# Patient Record
Sex: Female | Born: 1945
Health system: Southern US, Community
[De-identification: ages and names within clinical notes are randomized; demographics above are authoritative.]

## PROBLEM LIST (undated history)

## (undated) DIAGNOSIS — Z8719 Personal history of other diseases of the digestive system: Secondary | ICD-10-CM

## (undated) DIAGNOSIS — K219 Gastro-esophageal reflux disease without esophagitis: Secondary | ICD-10-CM

## (undated) DIAGNOSIS — E785 Hyperlipidemia, unspecified: Secondary | ICD-10-CM

## (undated) DIAGNOSIS — I219 Acute myocardial infarction, unspecified: Secondary | ICD-10-CM

## (undated) DIAGNOSIS — M1990 Unspecified osteoarthritis, unspecified site: Secondary | ICD-10-CM

## (undated) DIAGNOSIS — K921 Melena: Secondary | ICD-10-CM

## (undated) DIAGNOSIS — I10 Essential (primary) hypertension: Secondary | ICD-10-CM

## (undated) DIAGNOSIS — J189 Pneumonia, unspecified organism: Secondary | ICD-10-CM

## (undated) DIAGNOSIS — T7840XA Allergy, unspecified, initial encounter: Secondary | ICD-10-CM

## (undated) DIAGNOSIS — K635 Polyp of colon: Secondary | ICD-10-CM

## (undated) DIAGNOSIS — J449 Chronic obstructive pulmonary disease, unspecified: Secondary | ICD-10-CM

## (undated) DIAGNOSIS — Z9889 Other specified postprocedural states: Secondary | ICD-10-CM

## (undated) DIAGNOSIS — J309 Allergic rhinitis, unspecified: Secondary | ICD-10-CM

## (undated) DIAGNOSIS — I509 Heart failure, unspecified: Secondary | ICD-10-CM

## (undated) HISTORY — DX: Gastro-esophageal reflux disease without esophagitis: K21.9

## (undated) HISTORY — DX: Acute myocardial infarction, unspecified: I21.9

## (undated) HISTORY — DX: Allergy, unspecified, initial encounter: T78.40XA

## (undated) HISTORY — DX: Hyperlipidemia, unspecified: E78.5

## (undated) HISTORY — DX: Allergic rhinitis, unspecified: J30.9

## (undated) HISTORY — DX: Personal history of other diseases of the digestive system: Z87.19

## (undated) HISTORY — DX: Essential (primary) hypertension: I10

## (undated) HISTORY — DX: Polyp of colon: K63.5

## (undated) HISTORY — DX: Chronic obstructive pulmonary disease, unspecified: J44.9

## (undated) HISTORY — DX: Pneumonia, unspecified organism: J18.9

## (undated) HISTORY — PX: POLYPECTOMY: SHX149

## (undated) HISTORY — DX: Other specified postprocedural states: Z98.890

## (undated) HISTORY — DX: Unspecified osteoarthritis, unspecified site: M19.90

## (undated) HISTORY — DX: Melena: K92.1

## (undated) HISTORY — DX: Heart failure, unspecified: I50.9

## (undated) HISTORY — PX: COLONOSCOPY: SHX174

---

## 1995-07-24 DIAGNOSIS — I509 Heart failure, unspecified: Secondary | ICD-10-CM

## 1995-07-24 HISTORY — DX: Heart failure, unspecified: I50.9

## 1997-11-11 ENCOUNTER — Encounter: Admission: RE | Admit: 1997-11-11 | Discharge: 1997-11-11 | Payer: Self-pay | Admitting: Family Medicine

## 1997-12-07 ENCOUNTER — Encounter: Admission: RE | Admit: 1997-12-07 | Discharge: 1997-12-07 | Payer: Self-pay | Admitting: Family Medicine

## 1998-04-28 ENCOUNTER — Encounter: Admission: RE | Admit: 1998-04-28 | Discharge: 1998-04-28 | Payer: Self-pay | Admitting: Family Medicine

## 1998-08-29 ENCOUNTER — Encounter: Admission: RE | Admit: 1998-08-29 | Discharge: 1998-08-29 | Payer: Self-pay | Admitting: Family Medicine

## 1998-09-05 ENCOUNTER — Encounter: Admission: RE | Admit: 1998-09-05 | Discharge: 1998-09-05 | Payer: Self-pay | Admitting: Sports Medicine

## 1998-10-19 ENCOUNTER — Encounter: Admission: RE | Admit: 1998-10-19 | Discharge: 1998-10-19 | Payer: Self-pay | Admitting: Family Medicine

## 1998-10-26 ENCOUNTER — Encounter: Admission: RE | Admit: 1998-10-26 | Discharge: 1998-10-26 | Payer: Self-pay | Admitting: Family Medicine

## 1999-03-28 ENCOUNTER — Encounter: Admission: RE | Admit: 1999-03-28 | Discharge: 1999-03-28 | Payer: Self-pay | Admitting: Family Medicine

## 1999-04-27 ENCOUNTER — Encounter: Admission: RE | Admit: 1999-04-27 | Discharge: 1999-04-27 | Payer: Self-pay | Admitting: Family Medicine

## 1999-06-08 ENCOUNTER — Encounter: Admission: RE | Admit: 1999-06-08 | Discharge: 1999-06-08 | Payer: Self-pay | Admitting: Sports Medicine

## 1999-06-08 ENCOUNTER — Encounter: Payer: Self-pay | Admitting: Sports Medicine

## 1999-06-19 ENCOUNTER — Encounter: Admission: RE | Admit: 1999-06-19 | Discharge: 1999-06-19 | Payer: Self-pay | Admitting: Family Medicine

## 1999-07-04 ENCOUNTER — Encounter: Admission: RE | Admit: 1999-07-04 | Discharge: 1999-07-04 | Payer: Self-pay | Admitting: Sports Medicine

## 1999-08-22 ENCOUNTER — Encounter: Admission: RE | Admit: 1999-08-22 | Discharge: 1999-08-22 | Payer: Self-pay | Admitting: Sports Medicine

## 1999-09-26 ENCOUNTER — Encounter: Admission: RE | Admit: 1999-09-26 | Discharge: 1999-09-26 | Payer: Self-pay | Admitting: Sports Medicine

## 1999-10-07 ENCOUNTER — Encounter: Payer: Self-pay | Admitting: Emergency Medicine

## 1999-10-08 ENCOUNTER — Encounter: Payer: Self-pay | Admitting: *Deleted

## 1999-10-08 ENCOUNTER — Inpatient Hospital Stay (HOSPITAL_COMMUNITY): Admission: EM | Admit: 1999-10-08 | Discharge: 1999-10-13 | Payer: Self-pay | Admitting: Emergency Medicine

## 1999-10-09 ENCOUNTER — Encounter: Payer: Self-pay | Admitting: Surgery

## 1999-10-10 ENCOUNTER — Encounter: Payer: Self-pay | Admitting: Surgery

## 1999-10-11 ENCOUNTER — Encounter: Payer: Self-pay | Admitting: Surgery

## 1999-10-25 ENCOUNTER — Encounter: Admission: RE | Admit: 1999-10-25 | Discharge: 1999-10-25 | Payer: Self-pay | Admitting: Family Medicine

## 2000-02-27 ENCOUNTER — Encounter: Admission: RE | Admit: 2000-02-27 | Discharge: 2000-02-27 | Payer: Self-pay | Admitting: Family Medicine

## 2000-03-21 ENCOUNTER — Encounter: Admission: RE | Admit: 2000-03-21 | Discharge: 2000-03-21 | Payer: Self-pay | Admitting: Family Medicine

## 2000-04-24 ENCOUNTER — Encounter: Admission: RE | Admit: 2000-04-24 | Discharge: 2000-04-24 | Payer: Self-pay | Admitting: Family Medicine

## 2000-06-07 ENCOUNTER — Encounter: Admission: RE | Admit: 2000-06-07 | Discharge: 2000-06-07 | Payer: Self-pay | Admitting: Family Medicine

## 2000-06-11 ENCOUNTER — Encounter: Payer: Self-pay | Admitting: *Deleted

## 2000-06-11 ENCOUNTER — Encounter: Admission: RE | Admit: 2000-06-11 | Discharge: 2000-06-11 | Payer: Self-pay | Admitting: *Deleted

## 2000-07-05 ENCOUNTER — Encounter: Admission: RE | Admit: 2000-07-05 | Discharge: 2000-07-05 | Payer: Self-pay | Admitting: Family Medicine

## 2000-07-23 HISTORY — PX: CORONARY ANGIOPLASTY WITH STENT PLACEMENT: SHX49

## 2000-08-02 ENCOUNTER — Encounter: Admission: RE | Admit: 2000-08-02 | Discharge: 2000-08-02 | Payer: Self-pay | Admitting: Family Medicine

## 2000-09-11 ENCOUNTER — Encounter: Admission: RE | Admit: 2000-09-11 | Discharge: 2000-09-11 | Payer: Self-pay | Admitting: Family Medicine

## 2000-09-16 ENCOUNTER — Encounter: Admission: RE | Admit: 2000-09-16 | Discharge: 2000-09-16 | Payer: Self-pay | Admitting: Family Medicine

## 2000-10-16 ENCOUNTER — Encounter: Admission: RE | Admit: 2000-10-16 | Discharge: 2000-10-16 | Payer: Self-pay | Admitting: Family Medicine

## 2000-11-27 ENCOUNTER — Encounter: Admission: RE | Admit: 2000-11-27 | Discharge: 2000-11-27 | Payer: Self-pay | Admitting: Family Medicine

## 2001-01-01 ENCOUNTER — Encounter: Admission: RE | Admit: 2001-01-01 | Discharge: 2001-01-01 | Payer: Self-pay | Admitting: Family Medicine

## 2001-02-07 ENCOUNTER — Encounter: Admission: RE | Admit: 2001-02-07 | Discharge: 2001-02-07 | Payer: Self-pay | Admitting: Family Medicine

## 2001-02-27 ENCOUNTER — Encounter: Admission: RE | Admit: 2001-02-27 | Discharge: 2001-02-27 | Payer: Self-pay | Admitting: Family Medicine

## 2001-04-09 ENCOUNTER — Encounter: Payer: Self-pay | Admitting: Cardiology

## 2001-04-09 ENCOUNTER — Inpatient Hospital Stay (HOSPITAL_COMMUNITY): Admission: EM | Admit: 2001-04-09 | Discharge: 2001-04-11 | Payer: Self-pay | Admitting: Emergency Medicine

## 2001-04-22 ENCOUNTER — Encounter (HOSPITAL_COMMUNITY): Admission: RE | Admit: 2001-04-22 | Discharge: 2001-07-21 | Payer: Self-pay | Admitting: Cardiology

## 2001-06-06 ENCOUNTER — Ambulatory Visit (HOSPITAL_COMMUNITY): Admission: RE | Admit: 2001-06-06 | Discharge: 2001-06-06 | Payer: Self-pay | Admitting: Gastroenterology

## 2001-07-03 ENCOUNTER — Encounter: Payer: Self-pay | Admitting: Sports Medicine

## 2001-07-03 ENCOUNTER — Encounter: Admission: RE | Admit: 2001-07-03 | Discharge: 2001-07-03 | Payer: Self-pay | Admitting: Sports Medicine

## 2001-07-23 ENCOUNTER — Encounter (HOSPITAL_COMMUNITY): Admission: RE | Admit: 2001-07-23 | Discharge: 2001-10-21 | Payer: Self-pay | Admitting: Cardiology

## 2001-07-31 ENCOUNTER — Encounter: Admission: RE | Admit: 2001-07-31 | Discharge: 2001-07-31 | Payer: Self-pay | Admitting: Family Medicine

## 2001-11-11 ENCOUNTER — Ambulatory Visit (HOSPITAL_COMMUNITY): Admission: RE | Admit: 2001-11-11 | Discharge: 2001-11-11 | Payer: Self-pay | Admitting: Cardiology

## 2001-11-11 ENCOUNTER — Encounter: Payer: Self-pay | Admitting: Cardiology

## 2001-12-12 ENCOUNTER — Encounter: Admission: RE | Admit: 2001-12-12 | Discharge: 2001-12-12 | Payer: Self-pay | Admitting: Family Medicine

## 2001-12-15 ENCOUNTER — Encounter: Admission: RE | Admit: 2001-12-15 | Discharge: 2001-12-15 | Payer: Self-pay | Admitting: Sports Medicine

## 2002-01-20 ENCOUNTER — Encounter: Admission: RE | Admit: 2002-01-20 | Discharge: 2002-01-20 | Payer: Self-pay | Admitting: Family Medicine

## 2002-01-26 ENCOUNTER — Encounter: Admission: RE | Admit: 2002-01-26 | Discharge: 2002-01-26 | Payer: Self-pay | Admitting: Sports Medicine

## 2002-01-26 ENCOUNTER — Encounter: Payer: Self-pay | Admitting: Sports Medicine

## 2002-02-16 ENCOUNTER — Encounter: Admission: RE | Admit: 2002-02-16 | Discharge: 2002-02-16 | Payer: Self-pay | Admitting: Family Medicine

## 2002-03-17 ENCOUNTER — Encounter: Admission: RE | Admit: 2002-03-17 | Discharge: 2002-03-17 | Payer: Self-pay | Admitting: Sports Medicine

## 2002-05-15 ENCOUNTER — Encounter: Admission: RE | Admit: 2002-05-15 | Discharge: 2002-05-15 | Payer: Self-pay | Admitting: Family Medicine

## 2002-05-18 ENCOUNTER — Encounter: Admission: RE | Admit: 2002-05-18 | Discharge: 2002-05-18 | Payer: Self-pay | Admitting: Family Medicine

## 2002-08-05 ENCOUNTER — Encounter: Admission: RE | Admit: 2002-08-05 | Discharge: 2002-08-05 | Payer: Self-pay | Admitting: Family Medicine

## 2002-08-14 ENCOUNTER — Encounter: Admission: RE | Admit: 2002-08-14 | Discharge: 2002-08-14 | Payer: Self-pay | Admitting: Sports Medicine

## 2002-08-14 ENCOUNTER — Encounter: Payer: Self-pay | Admitting: Sports Medicine

## 2002-10-26 ENCOUNTER — Encounter: Admission: RE | Admit: 2002-10-26 | Discharge: 2002-10-26 | Payer: Self-pay | Admitting: Family Medicine

## 2003-05-21 ENCOUNTER — Encounter: Admission: RE | Admit: 2003-05-21 | Discharge: 2003-05-21 | Payer: Self-pay | Admitting: Family Medicine

## 2003-05-27 ENCOUNTER — Encounter: Admission: RE | Admit: 2003-05-27 | Discharge: 2003-05-27 | Payer: Self-pay | Admitting: Sports Medicine

## 2003-07-24 DIAGNOSIS — K635 Polyp of colon: Secondary | ICD-10-CM

## 2003-07-24 HISTORY — DX: Polyp of colon: K63.5

## 2003-09-10 ENCOUNTER — Encounter: Admission: RE | Admit: 2003-09-10 | Discharge: 2003-09-10 | Payer: Self-pay | Admitting: Family Medicine

## 2003-09-14 ENCOUNTER — Encounter: Admission: RE | Admit: 2003-09-14 | Discharge: 2003-09-14 | Payer: Self-pay | Admitting: Sports Medicine

## 2003-09-17 ENCOUNTER — Encounter: Admission: RE | Admit: 2003-09-17 | Discharge: 2003-09-17 | Payer: Self-pay | Admitting: Family Medicine

## 2003-10-28 ENCOUNTER — Encounter: Admission: RE | Admit: 2003-10-28 | Discharge: 2003-10-28 | Payer: Self-pay | Admitting: Sports Medicine

## 2003-10-29 ENCOUNTER — Encounter: Admission: RE | Admit: 2003-10-29 | Discharge: 2003-10-29 | Payer: Self-pay | Admitting: Family Medicine

## 2003-12-13 ENCOUNTER — Encounter: Admission: RE | Admit: 2003-12-13 | Discharge: 2003-12-13 | Payer: Self-pay | Admitting: Family Medicine

## 2004-04-10 ENCOUNTER — Ambulatory Visit: Payer: Self-pay | Admitting: Family Medicine

## 2004-05-08 ENCOUNTER — Ambulatory Visit: Payer: Self-pay | Admitting: Sports Medicine

## 2004-09-29 ENCOUNTER — Ambulatory Visit: Payer: Self-pay | Admitting: Family Medicine

## 2004-11-22 ENCOUNTER — Encounter: Admission: RE | Admit: 2004-11-22 | Discharge: 2004-11-22 | Payer: Self-pay | Admitting: Cardiology

## 2005-01-16 ENCOUNTER — Ambulatory Visit: Payer: Self-pay | Admitting: Sports Medicine

## 2005-02-01 ENCOUNTER — Ambulatory Visit: Payer: Self-pay | Admitting: Family Medicine

## 2005-05-10 ENCOUNTER — Ambulatory Visit: Payer: Self-pay | Admitting: Family Medicine

## 2005-05-23 ENCOUNTER — Encounter (INDEPENDENT_AMBULATORY_CARE_PROVIDER_SITE_OTHER): Payer: Self-pay | Admitting: *Deleted

## 2005-05-23 LAB — CONVERTED CEMR LAB

## 2005-06-11 ENCOUNTER — Ambulatory Visit: Payer: Self-pay | Admitting: Family Medicine

## 2005-12-19 ENCOUNTER — Ambulatory Visit: Payer: Self-pay | Admitting: Family Medicine

## 2005-12-19 ENCOUNTER — Encounter: Admission: RE | Admit: 2005-12-19 | Discharge: 2005-12-19 | Payer: Self-pay | Admitting: Sports Medicine

## 2005-12-24 ENCOUNTER — Encounter: Admission: RE | Admit: 2005-12-24 | Discharge: 2005-12-24 | Payer: Self-pay | Admitting: Family Medicine

## 2006-03-28 ENCOUNTER — Ambulatory Visit: Payer: Self-pay | Admitting: Family Medicine

## 2006-03-29 ENCOUNTER — Ambulatory Visit: Payer: Self-pay | Admitting: Family Medicine

## 2006-04-01 ENCOUNTER — Ambulatory Visit: Payer: Self-pay | Admitting: Family Medicine

## 2006-09-19 DIAGNOSIS — I10 Essential (primary) hypertension: Secondary | ICD-10-CM | POA: Insufficient documentation

## 2006-09-19 DIAGNOSIS — J309 Allergic rhinitis, unspecified: Secondary | ICD-10-CM

## 2006-09-19 DIAGNOSIS — K210 Gastro-esophageal reflux disease with esophagitis: Secondary | ICD-10-CM

## 2006-09-19 DIAGNOSIS — M159 Polyosteoarthritis, unspecified: Secondary | ICD-10-CM | POA: Insufficient documentation

## 2006-09-19 DIAGNOSIS — E7800 Pure hypercholesterolemia, unspecified: Secondary | ICD-10-CM | POA: Insufficient documentation

## 2006-09-19 DIAGNOSIS — I739 Peripheral vascular disease, unspecified: Secondary | ICD-10-CM

## 2006-09-19 DIAGNOSIS — F17200 Nicotine dependence, unspecified, uncomplicated: Secondary | ICD-10-CM | POA: Insufficient documentation

## 2006-09-19 DIAGNOSIS — I2510 Atherosclerotic heart disease of native coronary artery without angina pectoris: Secondary | ICD-10-CM

## 2006-09-19 HISTORY — DX: Allergic rhinitis, unspecified: J30.9

## 2006-09-20 ENCOUNTER — Encounter (INDEPENDENT_AMBULATORY_CARE_PROVIDER_SITE_OTHER): Payer: Self-pay | Admitting: *Deleted

## 2006-10-02 ENCOUNTER — Telehealth (INDEPENDENT_AMBULATORY_CARE_PROVIDER_SITE_OTHER): Payer: Self-pay | Admitting: Family Medicine

## 2006-11-20 ENCOUNTER — Ambulatory Visit: Payer: Self-pay | Admitting: Family Medicine

## 2006-11-20 DIAGNOSIS — K219 Gastro-esophageal reflux disease without esophagitis: Secondary | ICD-10-CM | POA: Insufficient documentation

## 2007-01-16 ENCOUNTER — Encounter: Admission: RE | Admit: 2007-01-16 | Discharge: 2007-01-16 | Payer: Self-pay | Admitting: Sports Medicine

## 2007-01-16 ENCOUNTER — Encounter (INDEPENDENT_AMBULATORY_CARE_PROVIDER_SITE_OTHER): Payer: Self-pay | Admitting: Family Medicine

## 2007-01-16 ENCOUNTER — Telehealth (INDEPENDENT_AMBULATORY_CARE_PROVIDER_SITE_OTHER): Payer: Self-pay | Admitting: Family Medicine

## 2007-01-30 ENCOUNTER — Encounter (INDEPENDENT_AMBULATORY_CARE_PROVIDER_SITE_OTHER): Payer: Self-pay | Admitting: Family Medicine

## 2007-01-30 ENCOUNTER — Encounter: Admission: RE | Admit: 2007-01-30 | Discharge: 2007-01-30 | Payer: Self-pay | Admitting: Sports Medicine

## 2007-03-17 ENCOUNTER — Telehealth (INDEPENDENT_AMBULATORY_CARE_PROVIDER_SITE_OTHER): Payer: Self-pay | Admitting: *Deleted

## 2007-03-21 ENCOUNTER — Encounter (INDEPENDENT_AMBULATORY_CARE_PROVIDER_SITE_OTHER): Payer: Self-pay | Admitting: Family Medicine

## 2007-04-28 ENCOUNTER — Ambulatory Visit: Payer: Self-pay | Admitting: Family Medicine

## 2007-05-29 ENCOUNTER — Ambulatory Visit: Payer: Self-pay | Admitting: Family Medicine

## 2007-06-05 ENCOUNTER — Ambulatory Visit: Payer: Self-pay | Admitting: Family Medicine

## 2007-06-05 ENCOUNTER — Encounter (INDEPENDENT_AMBULATORY_CARE_PROVIDER_SITE_OTHER): Payer: Self-pay | Admitting: Family Medicine

## 2007-06-05 LAB — CONVERTED CEMR LAB
AST: 18 units/L (ref 0–37)
Chloride: 106 meq/L (ref 96–112)
Cholesterol: 164 mg/dL (ref 0–200)
Creatinine, Ser: 0.75 mg/dL (ref 0.40–1.20)
Glucose, Bld: 99 mg/dL (ref 70–99)
HDL: 42 mg/dL (ref >39)
Sodium: 143 meq/L (ref 135–145)
Total Bilirubin: 0.8 mg/dL (ref 0.3–1.2)
Total CHOL/HDL Ratio: 3.9
Triglycerides: 269 mg/dL — ABNORMAL HIGH (ref <150)
VLDL: 54 mg/dL — ABNORMAL HIGH (ref 0–40)

## 2007-06-10 ENCOUNTER — Encounter (INDEPENDENT_AMBULATORY_CARE_PROVIDER_SITE_OTHER): Payer: Self-pay | Admitting: Family Medicine

## 2008-02-12 ENCOUNTER — Encounter: Admission: RE | Admit: 2008-02-12 | Discharge: 2008-02-12 | Payer: Self-pay | Admitting: Family Medicine

## 2008-02-20 ENCOUNTER — Encounter: Admission: RE | Admit: 2008-02-20 | Discharge: 2008-02-20 | Payer: Self-pay | Admitting: Family Medicine

## 2008-03-01 ENCOUNTER — Encounter (INDEPENDENT_AMBULATORY_CARE_PROVIDER_SITE_OTHER): Payer: Self-pay | Admitting: Family Medicine

## 2008-03-16 ENCOUNTER — Ambulatory Visit: Payer: Self-pay | Admitting: Family Medicine

## 2008-03-17 ENCOUNTER — Encounter (INDEPENDENT_AMBULATORY_CARE_PROVIDER_SITE_OTHER): Payer: Self-pay | Admitting: Family Medicine

## 2008-03-25 ENCOUNTER — Encounter (INDEPENDENT_AMBULATORY_CARE_PROVIDER_SITE_OTHER): Payer: Self-pay | Admitting: Family Medicine

## 2008-03-25 ENCOUNTER — Ambulatory Visit: Payer: Self-pay | Admitting: Family Medicine

## 2008-03-25 LAB — CONVERTED CEMR LAB
Albumin: 4.5 g/dL (ref 3.5–5.2)
BUN: 14 mg/dL (ref 6–23)
CO2: 28 meq/L (ref 19–32)
Creatinine, Ser: 0.64 mg/dL (ref 0.40–1.20)
LDL Cholesterol: 45 mg/dL (ref 0–99)
Sodium: 143 meq/L (ref 135–145)
Total CHOL/HDL Ratio: 2.3
Total Protein: 7.2 g/dL (ref 6.0–8.3)
Triglycerides: 146 mg/dL (ref <150)
VLDL: 29 mg/dL (ref 0–40)

## 2008-03-26 ENCOUNTER — Ambulatory Visit: Payer: Self-pay | Admitting: Family Medicine

## 2008-03-26 ENCOUNTER — Encounter (INDEPENDENT_AMBULATORY_CARE_PROVIDER_SITE_OTHER): Payer: Self-pay | Admitting: Family Medicine

## 2008-03-29 LAB — CONVERTED CEMR LAB: Pap Smear: NORMAL

## 2009-03-01 ENCOUNTER — Encounter: Admission: RE | Admit: 2009-03-01 | Discharge: 2009-03-01 | Payer: Self-pay | Admitting: Family Medicine

## 2009-03-14 ENCOUNTER — Ambulatory Visit: Payer: Self-pay | Admitting: Family Medicine

## 2009-04-07 ENCOUNTER — Ambulatory Visit: Payer: Self-pay | Admitting: Family Medicine

## 2009-04-07 ENCOUNTER — Encounter: Payer: Self-pay | Admitting: Family Medicine

## 2009-04-07 LAB — CONVERTED CEMR LAB
BUN: 15 mg/dL (ref 6–23)
Calcium: 9.4 mg/dL (ref 8.4–10.5)
Chloride: 107 meq/L (ref 96–112)
Creatinine, Ser: 0.66 mg/dL (ref 0.40–1.20)
HDL: 56 mg/dL (ref >39)
Sodium: 143 meq/L (ref 135–145)
Total CHOL/HDL Ratio: 2.6
VLDL: 51 mg/dL — ABNORMAL HIGH (ref 0–40)

## 2009-04-12 ENCOUNTER — Encounter: Payer: Self-pay | Admitting: Family Medicine

## 2009-06-09 ENCOUNTER — Encounter: Payer: Self-pay | Admitting: Family Medicine

## 2009-07-06 ENCOUNTER — Telehealth: Payer: Self-pay | Admitting: Family Medicine

## 2009-07-06 ENCOUNTER — Ambulatory Visit: Payer: Self-pay | Admitting: Family Medicine

## 2009-07-06 ENCOUNTER — Encounter: Payer: Self-pay | Admitting: Family Medicine

## 2009-07-06 LAB — CONVERTED CEMR LAB
ALT: 19 units/L (ref 0–35)
AST: 20 units/L (ref 0–37)
Alkaline Phosphatase: 53 units/L (ref 39–117)
Basophils Absolute: 0 10*3/uL (ref 0.0–0.1)
Basophils Relative: 1 % (ref 0–1)
Calcium: 9.9 mg/dL (ref 8.4–10.5)
Eosinophils Relative: 1 % (ref 0–5)
Glucose, Bld: 88 mg/dL (ref 70–99)
Lymphs Abs: 1 10*3/uL (ref 0.7–4.0)
Monocytes Relative: 6 % (ref 3–12)
Neutrophils Relative %: 78 % — ABNORMAL HIGH (ref 43–77)
RDW: 12.7 % (ref 11.5–15.5)
Total Bilirubin: 0.6 mg/dL (ref 0.3–1.2)

## 2009-07-08 ENCOUNTER — Telehealth: Payer: Self-pay | Admitting: Family Medicine

## 2010-03-01 ENCOUNTER — Encounter: Payer: Self-pay | Admitting: Family Medicine

## 2010-03-23 ENCOUNTER — Encounter: Admission: RE | Admit: 2010-03-23 | Discharge: 2010-03-23 | Payer: Self-pay | Admitting: Family Medicine

## 2010-03-28 ENCOUNTER — Ambulatory Visit: Payer: Self-pay | Admitting: Family Medicine

## 2010-03-28 DIAGNOSIS — L708 Other acne: Secondary | ICD-10-CM | POA: Insufficient documentation

## 2010-03-30 ENCOUNTER — Ambulatory Visit: Payer: Self-pay | Admitting: Diagnostic Radiology

## 2010-03-30 ENCOUNTER — Encounter: Payer: Self-pay | Admitting: Emergency Medicine

## 2010-03-30 ENCOUNTER — Encounter: Payer: Self-pay | Admitting: Family Medicine

## 2010-03-30 ENCOUNTER — Ambulatory Visit: Payer: Self-pay | Admitting: Family Medicine

## 2010-03-30 ENCOUNTER — Inpatient Hospital Stay (HOSPITAL_COMMUNITY): Admission: EM | Admit: 2010-03-30 | Discharge: 2010-04-05 | Payer: Self-pay | Admitting: Family Medicine

## 2010-03-30 LAB — CONVERTED CEMR LAB
Cholesterol: 137 mg/dL
Total CHOL/HDL Ratio: 2.2
Triglycerides: 98 mg/dL

## 2010-04-04 ENCOUNTER — Encounter: Payer: Self-pay | Admitting: *Deleted

## 2010-04-05 LAB — CONVERTED CEMR LAB
CO2: 27 meq/L
Creatinine, Ser: 0.54 mg/dL
Folate: 20 ng/mL
MCV: 105.6 fL
Platelets: 224 10*3/uL

## 2010-04-06 ENCOUNTER — Encounter: Payer: Self-pay | Admitting: Family Medicine

## 2010-04-12 ENCOUNTER — Encounter: Payer: Self-pay | Admitting: Family Medicine

## 2010-04-12 ENCOUNTER — Ambulatory Visit: Payer: Self-pay | Admitting: Family Medicine

## 2010-04-12 LAB — CONVERTED CEMR LAB
Folate: 20 ng/mL
HCT: 39.3 %
MCV: 105.6 fL
Platelets: 224 10*3/uL
WBC: 6.1 10*3/uL

## 2010-04-20 ENCOUNTER — Encounter: Payer: Self-pay | Admitting: Family Medicine

## 2010-05-25 ENCOUNTER — Encounter: Payer: Self-pay | Admitting: Family Medicine

## 2010-07-23 DIAGNOSIS — J189 Pneumonia, unspecified organism: Secondary | ICD-10-CM

## 2010-07-23 HISTORY — DX: Pneumonia, unspecified organism: J18.9

## 2010-08-14 ENCOUNTER — Encounter: Payer: Self-pay | Admitting: Family Medicine

## 2010-08-24 NOTE — Miscellaneous (Signed)
Summary: update problem list  Clinical Lists Changes  Problems: Removed problem of SHOULDER PAIN, RIGHT (ICD-719.41) Removed problem of SMALL BOWEL OBSTRUCTION (ICD-560.9) Removed problem of RASH-NONVESICULAR (ICD-782.1) Removed problem of Rule out  OSTEOPOROSIS (ICD-733.00) Removed problem of PRURITUS, EARS (ICD-698.9) Removed problem of SINUSITIS, CHRONIC, NOS (ICD-473.9) Removed problem of MIGRAINE, UNSPEC., W/O INTRACTABLE MIGRAINE (ICD-346.90) Removed problem of MENOPAUSAL SYNDROME (ICD-627.2) Removed problem of HYPERCHOLESTEROLEMIA, PURE (ICD-272.0) Observations: Added new observation of PAST MED HX: HTN HLD GERD arthritis- hands hx pos PPD, treated for 6 months,  hx sbo 1998, 09/1999,  MI 1997, 2002, NO ACE - cough, -- Dr. Julieanne Manson is cardiologist   Myoview- Nov 2010 EF 66% post menopausal, h/o systolic murmur,  tendonitis L wrist PVD with caludication h/o colitis 2001, 2011 Chronic sinusitis SBO 2011 (05/25/2010 11:30)      Past Medical History:    HTN    HLD    GERD    arthritis- hands    hx pos PPD, treated for 6 months,     hx sbo 1998, 09/1999,     MI 1997, 2002, NO ACE - cough, -- Dr. Julieanne Manson is cardiologist      Myoview- Nov 2010 EF 66%    post menopausal,    h/o systolic murmur,     tendonitis L wrist    PVD with caludication    h/o colitis 2001, 2011    Chronic sinusitis    SBO 2011

## 2010-08-24 NOTE — Letter (Signed)
Summary: Out of Work  [facility]  [address]   [place], [place] [address]   Phone: [phone]  Fax: [phone]    April 12, 2010   Employee:  Yahaira D Kopko    To Whom It May Concern:   [name] has been examined recently and cleared to return to her normal duties. If you have any concerns or  If you need additional information, please feel free to contact our office.         Sincerely,    Milinda Antis MD

## 2010-08-24 NOTE — Miscellaneous (Signed)
Summary: Change to Pravastatin  Clinical Lists Changes  Medications: Removed medication of ZOCOR 40 MG TABS (SIMVASTATIN) Take 1 tablet by mouth at bedtime Added new medication of PRAVACHOL 40 MG TABS (PRAVASTATIN SODIUM) 1 by mouth daily for cholesterol - Signed Rx of PRAVACHOL 40 MG TABS (PRAVASTATIN SODIUM) 1 by mouth daily for cholesterol;  #30 x 6;  Signed;  Entered by: Milinda Antis MD;  Authorized by: Milinda Antis MD;  Method used: Electronically to [facility]*, [address] Shipping/[address], [place], [place]  [address], Ph: [phone], Fax: [phone] Observations: Added new observation of CHOL/HDL: 2.2  (03/30/2010 15:02) Added new observation of LDL: 54 mg/dL (74/25/9563 87:56) Added new observation of HDL: 63 mg/dL (43/32/9518 84:16) Added new observation of TRIGLYC TOT: 98 mg/dL (60/63/0160 10:93) Added new observation of CHOLESTEROL: 137 mg/dL (23/55/7322 02:54)    Prescriptions: PRAVACHOL 40 MG TABS (PRAVASTATIN SODIUM) 1 by mouth daily for cholesterol  #30 x 6   Entered and Authorized by:   Milinda Antis MD   Signed by:   Milinda Antis MD on 04/06/2010   Method used:   Electronically to        [facility]* (retail)       [address] [place] [address]       [address] Shipping/mailing       [place], [place]  [address]       Ph: [phone]       Fax: [phone]   RxID:   [id number]    Pt currently on 40mg  of Simva, now gets meds at [facility]. In previous note, pt was not having any symptoms regarding Simva and Norvasc. However, weary of change due to cost. Pravastatin though weaker statin is Free for [employer] therefore will change to Pravastatin 40mg     Spoke with pt, told her okay to advance her diet slowly from bland foods. No Nausea or vomiting doing well. Given info about changing and that dose may need to be adjusted. Voiced understanding Milinda Antis MD  April 06, 2010  3:08 PM

## 2010-08-24 NOTE — Letter (Signed)
Summary: Generic Letter  [facility]  [address]   [place], [place] [address]   Phone: [phone]  Fax: [phone]    03/01/2010  [facility] [address] [place], [place]  [address]   Dear Ms. Yandow,     I am writing as you are overdue for your routine follow-up on your chronic medical problems. I would also like to discuss your medications and a possible change in your cholesterol pill. Please schedule an appt with me so we can discuss these issues.           Sincerely,   Milinda Antis MD  Appended Document: Generic Letter mailed.

## 2010-08-24 NOTE — Assessment & Plan Note (Signed)
Summary: [name] Page,[name] Page   Vital Signs:  Patient profile:   65 year old female Height:      62 inches Weight:      133 pounds BMI:     24.41 Temp:     98.5 degrees F oral Pulse rate:   109 / minute BP sitting:   132 / 73  (right arm) Cuff size:   regular  Vitals Entered By: Tessie Fass CMA (April 12, 2010 3:14 PM) CC: hospital f/u Is Patient Diabetic? No Pain Assessment Patient in pain? no        Primary Care Romari Gasparro:  Milinda Antis MD  CC:  hospital f/u.  History of Present Illness:  s/p hospitalization for complete SBO and colitis, treated with Cipro/flagyl for total of 10days. General Surgery consulted however pt SBO resolved medically.  [place]arting by mouth no pain, good BM, +flatus, does not feel bloated. Currently eating bland food. Back at work 1/2 time wants to return full time. First day felt a little weak but as the day went on felt better, today no difficulties with work duties  Continues to have muscle ache on right shoulder , seen in hospital for this given low dose muscle relaxant which relieved the aching/spasm  Reviewed med rec   Habits & Providers  Alcohol-Tobacco-Diet     Tobacco Status: current     Tobacco Counseling: to quit use of tobacco products     Cigarette Packs/Day: <0.25  Current Medications (verified): 1)  Bayer Aspirin 325 Mg Tabs (Aspirin) .... Take 1 Tablet By Mouth Once A Day 2)  Celebrex 200 Mg Caps (Celecoxib) .... One Tab By Mouth Once Daily 3)  Diovan 320 Mg Tabs (Valsartan) .... Take 1 Tablet By Mouth Once A Day 4)  Hydrochlorothiazide 25 Mg Tabs (Hydrochlorothiazide) .... Take 1 Tablet By Mouth Once A Day 5)  Norvasc 10 Mg Tabs (Amlodipine Besylate) .... Take 1 Tablet By Mouth Once A Day 6)  Pletal 100 Mg Tabs (Cilostazol) .... Take 1 Tablet By Mouth Twice A Day 7)  Coreg 12.5 Mg Tabs (Carvedilol) .Marland Kitchen.. 1 By Mouth Two Times A Day 8)  Nexium 40 Mg  Pack (Esomeprazole Magnesium) .Marland Kitchen.. 1 By Mouth Daily 9)  Lavoclen-8 Creamy Wash 8  % Liqd (Benzoyl Peroxide) .... Use Wash On Back 3-4 Times Per Week Disp: Qs X One Month 10)  Bactroban 2 % Oint (Mupirocin) .... Apply To Affected Area On Face Twice A Day X 7 Days Dispense- 1 Tube 11)  Pravachol 40 Mg Tabs (Pravastatin Sodium) .Marland Kitchen.. 1 By Mouth Daily For Cholesterol 12)  Flexeril 5 Mg Tabs (Cyclobenzaprine Hcl) .Marland Kitchen.. 1 By Mouth At Bedtime  Allergies (verified): 1)  Lisinopril (Lisinopril) 2)  Penicillin G Potassium (Penicillin G Potassium)  Physical Exam  General:  NAD, alert and oriented Vital signs noted  Mouth:  Oral mucosa and oropharynx without lesions or exudates.  Lungs:  Normal respiratory effort, chest expands symmetrically. Lungs are clear to auscultation, no crackles or wheezes.   Heart:  RRR, no murmur noted Abdomen:  soft, non-tender, normal bowel sounds, no distention, no masses, and no guarding.   Msk:  Right shoulder normal ROM, no joint tenderness, no joint swelling, no joint warmth, and no joint deformities.  no evidence of winged scapula compared to left no spasm noted during exam TTP at inferior border of scapula rotator cuff in tact Skin:  Right upper lip 1.5cm area of hyperpigmentation with a slight purple hue and surriounding erythema, non fluctuant, non indurated, small  papules within the lesion Multiple scarring hyperpigemented purple like lesions in scalp and behind right ear   Impression & Recommendations:  Problem # 1:  SMALL BOWEL OBSTRUCTION (ICD-560.9) Assessment New  Resolved, advance diet  Orders: [facility]- Est  Level 4 ([address])  Problem # 2:  HYPERTENSION, BENIGN SYSTEMIC (ICD-401.1) Assessment: Unchanged  continue current meds Her updated medication list for this problem includes:    Diovan 320 Mg Tabs (Valsartan) .Marland Kitchen... Take 1 tablet by mouth once a day    Hydrochlorothiazide 25 Mg Tabs (Hydrochlorothiazide) .Marland Kitchen... Take 1 tablet by mouth once a day    Norvasc 10 Mg Tabs (Amlodipine besylate) .Marland Kitchen... Take 1 tablet by mouth once a  day    Coreg 12.5 Mg Tabs (Carvedilol) .Marland Kitchen... 1 by mouth two times a day  Orders: [facility]- Est  Level 4 ([address])  Problem # 3:  SHOULDER PAIN, RIGHT (ICD-719.41) Assessment: New  no red flags, rotator cuff in tact, small dose of muscle relaxant prn Her updated medication list for this problem includes:    Bayer Aspirin 325 Mg Tabs (Aspirin) .Marland Kitchen... Take 1 tablet by mouth once a day    Celebrex 200 Mg Caps (Celecoxib) ..... One tab by mouth once daily    Flexeril 5 Mg Tabs (Cyclobenzaprine hcl) .Marland Kitchen... 1 by mouth at bedtime  Orders: [facility]- Est  Level 4 ([address])  Complete Medication List: 1)  Bayer Aspirin 325 Mg Tabs (Aspirin) .... Take 1 tablet by mouth once a day 2)  Celebrex 200 Mg Caps (Celecoxib) .... One tab by mouth once daily 3)  Diovan 320 Mg Tabs (Valsartan) .... Take 1 tablet by mouth once a day 4)  Hydrochlorothiazide 25 Mg Tabs (Hydrochlorothiazide) .... Take 1 tablet by mouth once a day 5)  Norvasc 10 Mg Tabs (Amlodipine besylate) .... Take 1 tablet by mouth once a day 6)  Pletal 100 Mg Tabs (Cilostazol) .... Take 1 tablet by mouth twice a day 7)  Coreg 12.5 Mg Tabs (Carvedilol) .Marland Kitchen.. 1 by mouth two times a day 8)  Nexium 40 Mg Pack (Esomeprazole magnesium) .Marland Kitchen.. 1 by mouth daily 9)  Lavoclen-8 Creamy Wash 8 % Liqd (Benzoyl peroxide) .... Use wash on back 3-4 times per week disp: qs x one month 10)  Bactroban 2 % Oint (Mupirocin) .... Apply to affected area on face twice a day x 7 days dispense- 1 tube 11)  Pravachol 40 Mg Tabs (Pravastatin sodium) .Marland Kitchen.. 1 by mouth daily for cholesterol 12)  Flexeril 5 Mg Tabs (Cyclobenzaprine hcl) .Marland Kitchen.. 1 by mouth at bedtime  Patient Instructions: 1)  Follow up in 3 months 2)  You can resume your regular diet, slowly 3)  I will follow-up with your dermatology appointment Prescriptions: FLEXERIL 5 MG TABS (CYCLOBENZAPRINE HCL) 1 by mouth at bedtime  #20 x 0   Entered and Authorized by:   Milinda Antis MD   Signed by:   Milinda Antis MD on  04/12/2010   Method used:   Electronically to        [facility]* (retail)       [address]       [address] Shipping/mailing       [place], [place]  [address]       Ph: [phone]       Fax: [phone]   RxID:   [id number]

## 2010-08-24 NOTE — Initial Assessments (Signed)
Summary: Hospital admission   Primary Care Provider:  Milinda Antis MD  CC:  Vomiting and abdominal pain.  History of Present Illness:   65 y.o. female with history of previous SBO and myocardial infarction presented with acute onset of abdominal pain and N/V.  Pt states abdominal pain and nausea began abruptly after eating dinner at approx 7:30pm. Emesis was clear initially but progressed to yellow/green emesis, denies any bloody streaks. Last BM yesterday, no flatus today. After recurrent emesis q 20 miinutes pt saught care.Pt presented to [facility] where KUB was obtained which showed PSBO. NG was placed for cotinued emesis. Of note recent med changes included Doxycycline for facial and back rash, as well as topical bacitracin.   ROS- denies chest pain, SOB, fever, dysuria, diarrhea,blood in stool, admits to abd pain, N/V per above, known chronic pain in feet 2/2 claudication, rash on face   Current Medications (verified): 1)  Bayer Aspirin 325 Mg Tabs (Aspirin) .... Take 1 Tablet By Mouth Once A Day 2)  Celebrex 200 Mg Caps (Celecoxib) .... One Tab By Mouth Once Daily 3)  Diovan 320 Mg Tabs (Valsartan) .... Take 1 Tablet By Mouth Once A Day 4)  Hydrochlorothiazide 25 Mg Tabs (Hydrochlorothiazide) .... Take 1 Tablet By Mouth Once A Day 5)  Norvasc 10 Mg Tabs (Amlodipine Besylate) .... Take 1 Tablet By Mouth Once A Day 6)  Pletal 100 Mg Tabs (Cilostazol) .... Take 1 Tablet By Mouth Twice A Day 7)  Zocor 40 Mg Tabs (Simvastatin) .... Take 1 Tablet By Mouth At Bedtime 8)  Coreg 12.5 Mg Tabs (Carvedilol) .Marland Kitchen.. 1 By Mouth Two Times A Day 9)  Nexium 40 Mg  Pack (Esomeprazole Magnesium) .Marland Kitchen.. 1 By Mouth Daily 10)  Lavoclen-8 Creamy Wash 8 % Liqd (Benzoyl Peroxide) .... Use Wash On Back 3-4 Times Per Week Disp: Qs X One Month 11)  Doxycycline Hyclate 100 Mg Tabs (Doxycycline Hyclate) .Marland Kitchen.. 1 By Mouth Two Times A Day X 10 Days  For Your Skin 12)  Bactroban 2 % Oint (Mupirocin) .... Apply  To Affected Area On Face Twice A Day X 7 Days Dispense- 1 Tube  Allergies (verified): 1)  Lisinopril (Lisinopril) 2)  Penicillin G Potassium (Penicillin G Potassium)  Past History:  Family History: Last updated: 10-09-06 father - CAD, MI died due to pacemaker faulty, no breast ca  Social History: Last updated: 09-Oct-2006 works at [employer]; lives with children; stopped tob use 07-2001.  Restarted 4/03; occ ETOH  Past Medical History: HTN HLD GERD arthritis- hands hx pos PPD, treated for 6 months,  hx sbo 1998, 09/1999,  MI 1997, 2002, NO ACE - cough, -- Dr. Julieanne Manson is cardiologist   Myoview- Nov 2010 EF 66% post menopausal, h/o systolic murmur,  tendonitis L wrist PVD with caludication h/o colitis 2001  Past Surgical History: ABI = 0.52 (moderate obstruction) - 12/16/2001, PTCA & stenting - 03/23/2001, PTCA 2v 1/97 - C section x 1   Physical Exam  General:  T 97.9 HR 101 RR 19 BP 165/94 97% RA GEN- NAD distress, falling asleep during exam, Axox3 HEENT- EOMI, non icteric, pink conjunctiva, neck supple, tachy MMM CVS- mild tachycardia HR 100, no murmur normal S1,S2 RESP- CTAB Abd- hypoactive BS, slightly tense abdomen, +distension, TTP diffusely, no rebound, +gaurding, no masses palpated, normal tympany Ext-no edema, decreased pulses bilat DP, post tibialis bilat, feet warm to touch  Skin:  inflammatory acne on back and arms, scarring hyperpigmentation, open and closed comeodones  also intermixed Right upper lip 1.5cm area of hyperpigmentation with a slight purple hue and surriounding erythema, non fluctuant, non indurated, small papules within the lesion Multiple scarring hyperpigemented purple like lesions in scalp and behind right ear Additional Exam:  CXR- no acute disease CT abd/pelvis- high grade SBO, noted colonic thickening distal to obstruction concern for Infectious vs inflammatory bowel disease causing obstruction;, exstensive artherosclerosis , scattered  ascites, 2 small renal cyst in right kidney, no lymphadenopathy  Labs: Lipase 63   CMET : Na 147 K 3.5  Cl 101 CO2 29 Glucose 162  BUN 15 Cr 0.6  TP 9.4 Ca 10.7  LFT wnl, T bili 1.4  Alk phos 96 Lactic acid 1.6      INR/PT  0.91/12.5 CBC 13.5/15.6/46.3/264  diff 85% neutrophils UA- 100 protein, 40 ketones, SP 1.018  Mico- hyaline case     65 y.o. female admitted with partial SBO concern for colitis  Impression & Recommendations:  Problem # 1:  Partial SBO Recurrent SBO, likley secondary to adhesions from complicated C section; however concern with new inflammatory changes on CT scan in setting of previous SBO and colitis. Will admit to telemetry, NG tube in place. Cover with Cipro/Flagyl for enteric colitis ( 7-10 days), noted leukocytosis, will hemoccult stools, if no improvment over next 24-48 hours consult surgery   Problem # 2:  HYPERTENSION, BENIGN SYSTEMIC (ICD-401.1)   Elevated BP currently, s/p emesis. Will hold all oral, utilize IV BB for now. Her updated medication list for this problem includes:    Diovan 320 Mg Tabs (Valsartan) .Marland Kitchen... Take 1 tablet by mouth once a day    Hydrochlorothiazide 25 Mg Tabs (Hydrochlorothiazide) .Marland Kitchen... Take 1 tablet by mouth once a day    Norvasc 10 Mg Tabs (Amlodipine besylate) .Marland Kitchen... Take 1 tablet by mouth once a day    Coreg 12.5 Mg Tabs (Carvedilol) .Marland Kitchen... 1 by mouth two times a day  Problem # 3:  CORONARY, ARTERIOSCLEROSIS (ICD-414.00)  No chest pain currently, on telemetry, will restart home oral  meds Check FLP as  to have as an outpatient  Her updated medication list for this problem includes:    Bayer Aspirin 325 Mg Tabs (Aspirin) .Marland Kitchen... Take 1 tablet by mouth once a day    Diovan 320 Mg Tabs (Valsartan) .Marland Kitchen... Take 1 tablet by mouth once a day    Hydrochlorothiazide 25 Mg Tabs (Hydrochlorothiazide) .Marland Kitchen... Take 1 tablet by mouth once a day    Norvasc 10 Mg Tabs (Amlodipine besylate) .Marland Kitchen... Take 1 tablet by mouth once a day    Pletal 100 Mg Tabs  (Cilostazol) .Marland Kitchen... Take 1 tablet by mouth twice a day    Coreg 12.5 Mg Tabs (Carvedilol) .Marland Kitchen... 1 by mouth two times a day  Problem # 4:  G E R D (ICD-530.81) Continue IV PPI Her updated medication list for this problem includes:    Nexium 40 Mg Pack (Esomeprazole magnesium) .Marland Kitchen... 1 by mouth daily  Problem # 5:  TOBACCO DEPENDENCE (ICD-305.1) Smoking cessation consult  Problem # 6:  RASH-NONVESICULAR (ICD-782.1) At this time, no progression of facial rash, will hold on doxy, treat as per above for colitis. PCP will follow rash  and dermatology as an outpatient  Problem # 7:  FEN IVF, NPO anti-emtics    Prophylaxis- Heparin sub q 8hrs  Dispo- Full code, pending clinical improvement  Complete Medication List: 1)  Bayer Aspirin 325 Mg Tabs (Aspirin) .... Take 1 tablet by mouth once a day  2)  Celebrex 200 Mg Caps (Celecoxib) .... One tab by mouth once daily 3)  Diovan 320 Mg Tabs (Valsartan) .... Take 1 tablet by mouth once a day 4)  Hydrochlorothiazide 25 Mg Tabs (Hydrochlorothiazide) .... Take 1 tablet by mouth once a day 5)  Norvasc 10 Mg Tabs (Amlodipine besylate) .... Take 1 tablet by mouth once a day 6)  Pletal 100 Mg Tabs (Cilostazol) .... Take 1 tablet by mouth twice a day 7)  Zocor 40 Mg Tabs (Simvastatin) .... Take 1 tablet by mouth at bedtime 8)  Coreg 12.5 Mg Tabs (Carvedilol) .Marland Kitchen.. 1 by mouth two times a day 9)  Nexium 40 Mg Pack (Esomeprazole magnesium) .Marland Kitchen.. 1 by mouth daily 10)  Lavoclen-8 Creamy Wash 8 % Liqd (Benzoyl peroxide) .... Use wash on back 3-4 times per week disp: qs x one month 11)  Doxycycline Hyclate 100 Mg Tabs (Doxycycline hyclate) .Marland Kitchen.. 1 by mouth two times a day x 10 days  for your skin 12)  Bactroban 2 % Oint (Mupirocin) .... Apply to affected area on face twice a day x 7 days dispense- 1 tube

## 2010-08-24 NOTE — Letter (Signed)
Summary: Generic Letter  [facility]  [address]   [place], [place] [address]   Phone: [phone]  Fax: [phone]    04/04/2010  [facility] [address] [place], [place]  [address]  Dear Ms. Dutton,     I have been unable to reach you by phone. You doctor requested we schedule an appointment for you with a Dermatologist .An appointment has been scheduled with Dr. Terri Piedra for April 20, 2010 at 9:40 AM. His address is [address], [place] , [place]. Phone number is [phone]. If this time is not convenient please call their office to reschedule.          Sincerely,   Theresia Lo RN

## 2010-08-24 NOTE — Assessment & Plan Note (Signed)
Summary: f/up,tcb   Vital Signs:  Patient profile:   65 year old female Height:      62 inches Weight:      137 pounds BMI:     25.15 Temp:     98.6 degrees F oral Pulse rate:   97 / minute BP sitting:   132 / 73  (left arm) Cuff size:   regular  Vitals Entered By: Jimmy Footman, CMA (March 28, 2010 1:41 PM) CC: BP f/u, med refills Is Patient Diabetic? No Pain Assessment Patient in pain? no        Primary Care Provider:  Milinda Antis MD  CC:  BP f/u and med refills.  History of Present Illness:    Pimple on right upper lip 6 months ago, scabed over initially, however was very pruritic and pt continued to scratch at it, now large dark area over lip with redness around it that bleeds everynow and then, has complaint of other pimples on her arms, back and face that also become very large spots. Tried neosporin which helped shrink the area over her lip over the past month  Denies fever   HTN- no chest pain, SOB, HA  Eating more veggies, fiber foods,  Exercising 4 days a week  Working Full Time    Smokes < 1/2ppd, has a cough intermittantly,no wheeze, SOB, mucous is clear, no lung diagnosis  Habits & Providers  Alcohol-Tobacco-Diet     Tobacco Status: current  Current Medications (verified): 1)  Bayer Aspirin 325 Mg Tabs (Aspirin) .... Take 1 Tablet By Mouth Once A Day 2)  Celebrex 200 Mg Caps (Celecoxib) .... One Tab By Mouth Once Daily 3)  Diovan 320 Mg Tabs (Valsartan) .... Take 1 Tablet By Mouth Once A Day 4)  Hydrochlorothiazide 25 Mg Tabs (Hydrochlorothiazide) .... Take 1 Tablet By Mouth Once A Day 5)  Norvasc 10 Mg Tabs (Amlodipine Besylate) .... Take 1 Tablet By Mouth Once A Day 6)  Pletal 100 Mg Tabs (Cilostazol) .... Take 1 Tablet By Mouth Twice A Day 7)  Zocor 40 Mg Tabs (Simvastatin) .... Take 1 Tablet By Mouth At Bedtime 8)  Coreg 12.5 Mg Tabs (Carvedilol) .Marland Kitchen.. 1 By Mouth Two Times A Day 9)  Nexium 40 Mg  Pack (Esomeprazole Magnesium) .Marland Kitchen.. 1 By Mouth  Daily 10)  Lavoclen-8 Creamy Wash 8 % Liqd (Benzoyl Peroxide) .... Use Wash On Back 3-4 Times Per Week Disp: Qs X One Month 11)  Doxycycline Hyclate 100 Mg Tabs (Doxycycline Hyclate) .Marland Kitchen.. 1 By Mouth Two Times A Day X 10 Days  For Your Skin 12)  Bactroban 2 % Oint (Mupirocin) .... Apply To Affected Area On Face Twice A Day X 7 Days Dispense- 1 Tube  Allergies (verified): 1)  Lisinopril (Lisinopril) 2)  Penicillin G Potassium (Penicillin G Potassium)  Past History:  Past Medical History: HTN HLD GERD arthritis- hands hx pos PPD, treated for 6 months,  hx sbo 1998, 09/1999,  MI 1997, 2002, NO ACE - cough, -- Dr. Julieanne Manson is cardiologist   Myoview- Nov 2010 EF 66% post menopausal,  systolic murmur,  tendonitis L wrist  Physical Exam  General:  NAD, Vital signs noted' Nose:  no external deformity and no nasal discharge.   Mouth:  Oral mucosa and oropharynx without lesions or exudates.  Neck:  supple.   Lungs:  Normal respiratory effort, chest expands symmetrically. Lungs are clear to auscultation, no crackles or wheezes.  Cough s/p exam- clear Heart:  RRR, no  murmur noted Pulses:  Pulses 1+, equal bilaterally  Extremities:  No edema Skin:  inflammatory acne on back and arms, scarring hyperpigmentation, open and closed comeodones also intermixed Right upper lip 1.5cm area of hyperpigmentation with a slight purple hue and surriounding erythema, non fluctuant, non indurated, small papules within the lesion Multiple scarring hyperpigemented purple like lesions in scalp and behind right ear   Impression & Recommendations:  Problem # 1:  RASH-NONVESICULAR (ICD-782.1) Assessment New  Rash on lip , appears post-inflammatory with correlation of other skin acne, other differentials less likley eczematous, karposi? with purple hue and scalp lesions, but low risk, less likley melanoma. Will start treatment with Doxy to cover MRSA/ bactroban, d/c neosporin, RTC, will send to Derm may  need a biopsy with lesions extending to scalp and behind the ear  Orders: [facility]- Est  Level 4 (04540) Dermatology Referral (Derma)  Problem # 2:  ACNE VULGARIS, CYSTIC, PUSTULAR (ICD-706.1) Assessment: Deteriorated  Doxy per above, Derm referral Her updated medication list for this problem includes:    Lavoclen-8 Creamy Wash 8 % Liqd (Benzoyl peroxide) ..... Use wash on back 3-4 times per week disp: qs x one month    Doxycycline Hyclate 100 Mg Tabs (Doxycycline hyclate) .Marland Kitchen... 1 by mouth two times a day x 10 days  for your skin  Orders: [facility]- Est  Level 4 (98119) Dermatology Referral (Derma)  Problem # 3:  HYPERTENSION, BENIGN SYSTEMIC (ICD-401.1) Assessment: Improved Discussed Novasc and Simva 40mg , pt has occ muscle aches with work however has been on meds > 3 years. As her copay are high she is would prefer to continue and switch to Lipitor once generic. Did not tolerate crestor per report no change to meds RTC for labs Her updated medication list for this problem includes:    Diovan 320 Mg Tabs (Valsartan) .Marland Kitchen... Take 1 tablet by mouth once a day    Hydrochlorothiazide 25 Mg Tabs (Hydrochlorothiazide) .Marland Kitchen... Take 1 tablet by mouth once a day    Norvasc 10 Mg Tabs (Amlodipine besylate) .Marland Kitchen... Take 1 tablet by mouth once a day    Coreg 12.5 Mg Tabs (Carvedilol) .Marland Kitchen... 1 by mouth two times a day  Orders: [facility]- Est  Level 4 (14782)  Problem # 4:  TOBACCO DEPENDENCE (ICD-305.1) Assessment: Unchanged  Pt not ready to quit, concerned with cough, not on ACE. Wll need PFT's exam normal today  Orders: [facility]- Est  Level 4 (95621)  Complete Medication List: 1)  Bayer Aspirin 325 Mg Tabs (Aspirin) .... Take 1 tablet by mouth once a day 2)  Celebrex 200 Mg Caps (Celecoxib) .... One tab by mouth once daily 3)  Diovan 320 Mg Tabs (Valsartan) .... Take 1 tablet by mouth once a day 4)  Hydrochlorothiazide 25 Mg Tabs (Hydrochlorothiazide) .... Take 1 tablet by mouth once a day 5)  Norvasc 10 Mg  Tabs (Amlodipine besylate) .... Take 1 tablet by mouth once a day 6)  Pletal 100 Mg Tabs (Cilostazol) .... Take 1 tablet by mouth twice a day 7)  Zocor 40 Mg Tabs (Simvastatin) .... Take 1 tablet by mouth at bedtime 8)  Coreg 12.5 Mg Tabs (Carvedilol) .Marland Kitchen.. 1 by mouth two times a day 9)  Nexium 40 Mg Pack (Esomeprazole magnesium) .Marland Kitchen.. 1 by mouth daily 10)  Lavoclen-8 Creamy Wash 8 % Liqd (Benzoyl peroxide) .... Use wash on back 3-4 times per week disp: qs x one month 11)  Doxycycline Hyclate 100 Mg Tabs (Doxycycline hyclate) .Marland Kitchen.. 1 by mouth  two times a day x 10 days  for your skin 12)  Bactroban 2 % Oint (Mupirocin) .... Apply to affected area on face twice a day x 7 days dispense- 1 tube  Other Orders: Future Orders: Comp Met-[facility] ([id number]) ... 03/30/2011 CBC-[facility] (08657) ... 03/24/2011 Lipid-[facility] ([id number]) ... 03/24/2011  Patient Instructions: 1)  For your rash- take the antibiotic twice a day 2)  Use the cream on your chin twice a day 3)  I will send you to a dermatologist as well 4)  Your blood pressure looks great, continue your current medications 5)  If you are having any increase in muscle aches please let me know and we will switch your Simvastatin to Lipitor 6)  Return fasting to have your labs drawn or get an AM appt and we can do both at the same time. 7)  Return in 1 week to recheck your skin Prescriptions: NEXIUM 40 MG  PACK (ESOMEPRAZOLE MAGNESIUM) 1 by mouth daily  #30 x 11   Entered and Authorized by:   Milinda Antis MD   Signed by:   Milinda Antis MD on 03/28/2010   Method used:   Electronically to        [facility] [address] (retail)       [address]       [place], [place]  [address]       Ph: [phone]       Fax: [phone]   RxID:   [id number] COREG 12.5 MG TABS (CARVEDILOL) 1 by mouth two times a day  #60 x 11   Entered and Authorized by:   Milinda Antis MD   Signed by:   Milinda Antis MD on 03/28/2010   Method  used:   Electronically to        [facility] [address] (retail)       [address]       [place], [place]  [address]       Ph: [phone]       Fax: [phone]   RxID:   [id number] ZOCOR 40 MG TABS (SIMVASTATIN) Take 1 tablet by mouth at bedtime  #30 x 3   Entered and Authorized by:   Milinda Antis MD   Signed by:   Milinda Antis MD on 03/28/2010   Method used:   Electronically to        [facility] [address] (retail)       [address] [place], [place]  [address]       Ph: [phone]       Fax: [phone]   RxID:   [id number] PLETAL 100 MG TABS (CILOSTAZOL) Take 1 tablet by mouth twice a day  #60 x 11   Entered and Authorized by:   Milinda Antis MD   Signed by:   Milinda Antis MD on 03/28/2010   Method used:   Electronically to        [facility] [address] (retail)       [address] [place], [place]  [address]       Ph: [phone]       Fax: [phone]   RxID:   [id number] NORVASC 10 MG  TABS (AMLODIPINE BESYLATE) Take 1 tablet by mouth once a day  #30 x 11   Entered and Authorized by:   Milinda Antis MD   Signed by:   Milinda Antis MD on 03/28/2010   Method used:   Electronically to        [facility] [address] (retail)       [address]       [place], [place]  [address]       Ph: [phone]       Fax: [phone]   RxID:   [id number] HYDROCHLOROTHIAZIDE 25 MG TABS (HYDROCHLOROTHIAZIDE) Take 1 tablet by mouth once a day  #30 x 11   Entered and Authorized by:   Milinda Antis MD   Signed by:   Milinda Antis MD on 03/28/2010   Method used:   Electronically to        [facility] [address] (retail)       [address]       [place], [place]  [address]       Ph: [phone]       Fax: [phone]   RxID:   [id number] DIOVAN 320 MG TABS (VALSARTAN) Take 1 tablet by mouth once a day  #30  x 11   Entered and Authorized by:   Milinda Antis MD   Signed by:   Milinda Antis MD on 03/28/2010   Method used:   Electronically to        [facility] [address] (retail)       [address]       [place], [place]  [address]       Ph: [phone]       Fax: [phone]   RxID:   [id number] CELEBREX 200 MG CAPS (CELECOXIB) one tab by mouth once daily  #30 x 11   Entered and Authorized by:   Milinda Antis MD   Signed by:   Milinda Antis MD on 03/28/2010   Method used:   Electronically to        [facility] [address] (retail)       [address]       [place], [place]  [address]       Ph: [phone]       Fax: [phone]   RxID:   [id number] BAYER ASPIRIN 325 MG TABS (ASPIRIN) Take 1 tablet by mouth once a day  #30 x 11   Entered and Authorized by:   Milinda Antis MD   Signed by:   Milinda Antis MD on 03/28/2010   Method used:   Electronically to        [facility] [address] (retail)       [address] [place], [place]  [address]       Ph: [phone]       Fax: [phone]   RxID:   [id number] BACTROBAN 2 % OINT (MUPIROCIN) apply to affected area on face twice a day x 7 days dispense- 1 tube  #1 x 0   Entered and Authorized by:   Milinda Antis MD   Signed by:   Milinda Antis MD on 03/28/2010  Method used:   Electronically to        [facility] [address] [address] (retail)       [address] [place], [place]  [address]       Ph: [phone]       Fax: [phone]   RxID:   [id number] DOXYCYCLINE HYCLATE 100 MG TABS (DOXYCYCLINE HYCLATE) 1 by mouth two times a day x 10 days  for your skin  #20 x 0   Entered and Authorized by:   Milinda Antis MD   Signed by:   Milinda Antis MD on 03/28/2010   Method used:   Electronically to        [facility] [address] (retail)       [address] [place],  [place]  [address]       Ph: [phone]       Fax: [phone]   RxID:   [id number]        Prevention & Chronic Care Immunizations   Influenza vaccine: Not documented    Tetanus booster: 11/20/2005: Done.   Tetanus booster due: 11/21/2015    Pneumococcal vaccine: Not documented    H. zoster vaccine: Not documented  Colorectal Screening   Hemoccult: Done.  (03/23/2004)   Hemoccult due: Not Indicated    Colonoscopy: Done.  (05/23/2001)   Colonoscopy due: 05/24/2011  Other Screening   Pap smear: normal  (03/29/2008)   Pap smear due: 03/2010    Mammogram: ASSESSMENT: Negative - BI-RADS 1^MM DIGITAL SCREENING  (03/23/2010)   Mammogram due: 03/01/2010    DXA bone density scan: Not documented   Smoking status: current  (03/28/2010)   Smoking cessation counseling: yes  (03/16/2008)  Lipids   Total Cholesterol: 143  (04/07/2009)   LDL: 36  (04/07/2009)   LDL Direct: Not documented   HDL: 56  (04/07/2009)   Triglycerides: 256  (04/07/2009)    SGOT (AST): 20  (07/06/2009)   SGPT (ALT): 19  (07/06/2009) CMP ordered    Alkaline phosphatase: 53  (07/06/2009)   Total bilirubin: 0.6  (07/06/2009)    Lipid flowsheet reviewed?: Yes   Progress toward LDL goal: Unchanged  Hypertension   Last Blood Pressure: 132 / 73  (03/28/2010)   Serum creatinine: 0.67  (07/06/2009)   Serum potassium 4.0  (07/06/2009) CMP ordered     Hypertension flowsheet reviewed?: Yes   Progress toward BP goal: At goal  Self-Management Support :   Personal Goals (by the next clinic visit) :      Personal blood pressure goal: 140/90  (07/06/2009)     Personal LDL goal: 70  (03/28/2010)    Hypertension self-management support: Written self-care plan  (07/06/2009)    Lipid self-management support: Written self-care plan  (07/06/2009)

## 2010-10-05 LAB — CBC
HCT: 38.5 % (ref 36.0–46.0)
Hemoglobin: 15.6 g/dL — ABNORMAL HIGH (ref 12.0–15.0)
Hemoglobin: 12.4 g/dL (ref 12.0–15.0)
MCH: 36 pg — ABNORMAL HIGH (ref 26.0–34.0)
MCH: 34.7 pg — ABNORMAL HIGH (ref 26.0–34.0)
MCH: 35.5 pg — ABNORMAL HIGH (ref 26.0–34.0)
MCHC: 32.2 g/dL (ref 30.0–36.0)
MCHC: 32.6 g/dL (ref 30.0–36.0)
MCV: 107.8 fL — ABNORMAL HIGH (ref 78.0–100.0)
Platelets: 184 10*3/uL (ref 150–400)
Platelets: 185 10*3/uL (ref 150–400)
Platelets: 208 10*3/uL (ref 150–400)
Platelets: 224 10*3/uL (ref 150–400)
RBC: 3.38 MIL/uL — ABNORMAL LOW (ref 3.87–5.11)
RBC: 3.57 MIL/uL — ABNORMAL LOW (ref 3.87–5.11)
RBC: 3.72 MIL/uL — ABNORMAL LOW (ref 3.87–5.11)
RDW: 13 % (ref 11.5–15.5)
RDW: 12.6 % (ref 11.5–15.5)
RDW: 12.8 % (ref 11.5–15.5)
RDW: 13.2 % (ref 11.5–15.5)
RDW: 13.4 % (ref 11.5–15.5)
WBC: 13.5 10*3/uL — ABNORMAL HIGH (ref 4.0–10.5)
WBC: 6.1 10*3/uL (ref 4.0–10.5)
WBC: 7.1 10*3/uL (ref 4.0–10.5)
WBC: 7.4 10*3/uL (ref 4.0–10.5)

## 2010-10-05 LAB — URINALYSIS, ROUTINE W REFLEX MICROSCOPIC
Glucose, UA: NEGATIVE mg/dL
Hgb urine dipstick: NEGATIVE
Leukocytes, UA: NEGATIVE
Specific Gravity, Urine: 1.018 (ref 1.005–1.030)
Urobilinogen, UA: 1 mg/dL (ref 0.0–1.0)
pH: 6.5 (ref 5.0–8.0)

## 2010-10-05 LAB — BASIC METABOLIC PANEL
BUN: 2 mg/dL — ABNORMAL LOW (ref 6–23)
BUN: 3 mg/dL — ABNORMAL LOW (ref 6–23)
BUN: 5 mg/dL — ABNORMAL LOW (ref 6–23)
CO2: 29 mEq/L (ref 19–32)
CO2: 30 mEq/L (ref 19–32)
CO2: 33 mEq/L — ABNORMAL HIGH (ref 19–32)
Calcium: 8.9 mg/dL (ref 8.4–10.5)
Calcium: 9 mg/dL (ref 8.4–10.5)
Calcium: 9 mg/dL (ref 8.4–10.5)
Calcium: 9.1 mg/dL (ref 8.4–10.5)
Calcium: 9.3 mg/dL (ref 8.4–10.5)
Calcium: 9.3 mg/dL (ref 8.4–10.5)
Calcium: 9.6 mg/dL (ref 8.4–10.5)
Calcium: 9.9 mg/dL (ref 8.4–10.5)
Chloride: 103 mEq/L (ref 96–112)
Creatinine, Ser: 0.45 mg/dL (ref 0.4–1.2)
Creatinine, Ser: 0.45 mg/dL (ref 0.4–1.2)
Creatinine, Ser: 0.45 mg/dL (ref 0.4–1.2)
Creatinine, Ser: 0.51 mg/dL (ref 0.4–1.2)
Creatinine, Ser: 0.54 mg/dL (ref 0.4–1.2)
Creatinine, Ser: 0.54 mg/dL (ref 0.4–1.2)
GFR calc Af Amer: >60 mL/min (ref >60)
GFR calc Af Amer: >60 mL/min (ref >60)
GFR calc Af Amer: >60 mL/min (ref >60)
GFR calc Af Amer: >60 mL/min (ref >60)
GFR calc Af Amer: >60 mL/min (ref >60)
GFR calc non Af Amer: >60 mL/min (ref >60)
GFR calc non Af Amer: >60 mL/min (ref >60)
GFR calc non Af Amer: >60 mL/min (ref >60)
GFR calc non Af Amer: >60 mL/min (ref >60)
Glucose, Bld: 114 mg/dL — ABNORMAL HIGH (ref 70–99)
Glucose, Bld: 117 mg/dL — ABNORMAL HIGH (ref 70–99)
Glucose, Bld: 117 mg/dL — ABNORMAL HIGH (ref 70–99)
Potassium: 4 mEq/L (ref 3.5–5.1)
Sodium: 140 mEq/L (ref 135–145)
Sodium: 140 mEq/L (ref 135–145)
Sodium: 141 mEq/L (ref 135–145)

## 2010-10-05 LAB — COMPREHENSIVE METABOLIC PANEL
Albumin: 5.2 g/dL (ref 3.5–5.2)
Alkaline Phosphatase: 96 U/L (ref 39–117)
BUN: 15 mg/dL (ref 6–23)
Calcium: 10.7 mg/dL — ABNORMAL HIGH (ref 8.4–10.5)
Chloride: 101 mEq/L (ref 96–112)
Creatinine, Ser: 0.6 mg/dL (ref 0.4–1.2)
GFR calc non Af Amer: >60 mL/min (ref >60)
Potassium: 3.5 mEq/L (ref 3.5–5.1)
Total Protein: 9.4 g/dL — ABNORMAL HIGH (ref 6.0–8.3)

## 2010-10-05 LAB — DIFFERENTIAL
Basophils Relative: 1 % (ref 0–1)
Eosinophils Absolute: 0.1 10*3/uL (ref 0.0–0.7)
Eosinophils Relative: 1 % (ref 0–5)
Lymphs Abs: 1.3 10*3/uL (ref 0.7–4.0)
Monocytes Absolute: 0.4 10*3/uL (ref 0.1–1.0)
Monocytes Relative: 3 % (ref 3–12)
Neutro Abs: 11.5 10*3/uL — ABNORMAL HIGH (ref 1.7–7.7)

## 2010-10-05 LAB — URINE MICROSCOPIC-ADD ON

## 2010-10-05 LAB — LACTIC ACID, PLASMA: Lactic Acid, Venous: 1.6 mmol/L (ref 0.5–2.2)

## 2010-10-05 LAB — MAGNESIUM: Magnesium: 2 mg/dL (ref 1.5–2.5)

## 2010-10-05 LAB — PROTIME-INR: INR: 0.91 (ref 0.00–1.49)

## 2010-10-05 LAB — FOLATE: Folate: >20 ng/mL

## 2010-10-05 LAB — LIPID PANEL
HDL: 63 mg/dL (ref >39)
VLDL: 20 mg/dL (ref 0–40)

## 2010-10-05 LAB — APTT: aPTT: 24 seconds (ref 24–37)

## 2010-10-05 LAB — VITAMIN B12: Vitamin B-12: 472 pg/mL (ref 211–911)

## 2010-10-05 LAB — LIPASE, BLOOD: Lipase: 63 U/L (ref 23–300)

## 2010-10-16 ENCOUNTER — Other Ambulatory Visit: Payer: Self-pay | Admitting: Family Medicine

## 2010-10-16 MED ORDER — ESOMEPRAZOLE MAGNESIUM 40 MG PO CPDR: 40.0000 mg | DELAYED_RELEASE_CAPSULE | Freq: Every day | ORAL | Status: DC

## 2010-10-16 MED ORDER — PRAVASTATIN SODIUM 40 MG PO TABS: 40.0000 mg | ORAL_TABLET | Freq: Every day | ORAL | Status: DC

## 2010-10-16 MED ORDER — VALSARTAN 320 MG PO TABS: 320.0000 mg | ORAL_TABLET | Freq: Every day | ORAL | Status: DC

## 2010-10-16 MED ORDER — CELECOXIB 200 MG PO CAPS: 200.0000 mg | ORAL_CAPSULE | Freq: Every day | ORAL | Status: DC

## 2010-10-16 MED ORDER — HYDROCHLOROTHIAZIDE 25 MG PO TABS: 25.0000 mg | ORAL_TABLET | Freq: Every day | ORAL | Status: DC

## 2010-10-16 MED ORDER — CARVEDILOL 12.5 MG PO TABS: 12.5000 mg | ORAL_TABLET | Freq: Two times a day (BID) | ORAL | Status: DC

## 2010-10-16 NOTE — Telephone Encounter (Signed)
Pt needed 90 day supply

## 2010-12-08 NOTE — Discharge Summary (Signed)
[place]. [facility]  Patient:    [name], [name] Page Visit Number: [id number] MRN: [id number]          Service Type: MED Location: [facility] [id number] Attending Physician:  Loreli Dollar Dictated by:   Adrian Saran, N.P. Admit Date:  04/09/2001 Discharge Date: 04/11/2001   CC:         [facility]   Discharge Summary  ADMISSION DIAGNOSES: 1. Acute anterior myocardial infarction. 2. Coronary artery disease, status post inferior myocardial infarction in    1997, status post percutaneous transluminal coronary angioplasty to the    right coronary artery, July 24, 1995, status post percutaneous    transluminal coronary angioplasty to the circumflex August 12, 1995. 3. Mitral valve prolapse. 4. Hyperlipidemia.  DISCHARGE DIAGNOSES: 1. Coronary artery disease, status post acute anterior myocardial infarction,    April 09, 2001, with:    a. Percutaneous transluminal coronary angioplasty and stent placement times       two to the right coronary artery.    b. Status post inferior myocardial infarction in 1997 with percutaneous       transluminal coronary angioplasty to the right coronary artery.    c. Status post percutaneous transluminal coronary angioplasty to the       circumflex. 2. Hyperlipidemia. 3. Mitral valve prolapse. 4. History of positive PPD in the past.  PROCEDURES:  Cardiac catheterization with PTCA and stent placement x 2.  COMPLICATIONS:  None.  DISCHARGE STATUS:  Stable and improved.  ADMISSION HISTORY:  This was a 65 year old [place] female who came to the emergency room with acute onset of chest pain.  Apparently, on the night prior to admission, she had what she described as a strange "sensation" ____ her midsternal region.  It then developed into what she described as a heaviness, and she felt as if she was having indigestion-type symptoms.  This began around 5 p.m. that evening and lasted through until the next  morning.  She states the pain severity would increase and decrease throughout the night. When she arrived at work the next morning, she was continuing to have pain. However, it became much worse.  She arrived at work around 6:30 in the morning, and works here at this facility.  Upon arriving on her floor, she developed feelings of near syncope, and had to sit down.  She had severe diaphoresis at this point, and was brought to the emergency room.  She was very lethargic at this point, and developed hypotension, and some mild bradycardia.  She received a fluid bolus as well as started on IV dopamine. She was given ___ for her chest discomfort.  At this point, she developed some nausea and vomiting.  She was diagnosed as having an acute anterior MI, and was emergently taken to the cath lab.  PHYSICAL EXAMINATION:  On admission - vital signs, blood pressure initially 70 systolic with heart rate in the 50s to 60s.  With starting dopamine, her blood pressure improved to 110s to 120s systolic.  Heart rate 81.  She was extremely diaphoretic.  Very drowsy and lethargic.  Lungs were clear to auscultation bilaterally.  Heart was regular rate and rhythm.  No murmur or gallop noted. No edema noted to the extremities.  Abdomen is soft and nontender.  Normal bowel sounds.  LABORATORY DATA:  EKG showed ST depression in the inferior leads.  Chest x-ray showed some probable interstitial edema.  Admission laboratory data showed a normal CBC and CMP with the  exception of elevated glucose at 261.  LFTs were normal.  Total CK with an MB on admission was 133 and 2.3.  Troponin was 0.06.  Repeat CKMB later on the day of admission showed total of 207 with 22.9 MB.  Fasting lipid panel showed total cholesterol of 150, HDL 30, LDL 52.  Triglycerides elevated at 295.  HOSPITAL COURSE:  The patient was taken emergently from the emergency room to the cardiac cath lab for emergency cath and possible intervention.   It was found that she had normal left main.  LAD was free of disease as was the optional diagonal.  The circumflex was basically normal and the prior angioplasty site was free of disease.  There was a very small third OM that had some irregularities.  However, this vessel was too small for intervention. The RCA was totally occluded at the mid section.  There was also an area in the proximal segment that had approximately 40% narrowing.  Emergency intervention was performed at this time.  One stent was placed to the midportion and a second stent was placed to the proximal segment.  PTCA was performed following stent placement.  She was placed on Integrilin for 24 hours.  She was started on Plavix IV in the cardiac cath lab.  She was admitted to the unit from the cath lab.  She did tolerate the procedure well. Blood pressure and heart rate remained stable.  She did complain of some mild left posterior scapular pain the night post catheterization.  This resolved the next day and did not recur.  Her ACE inhibitor was restarted on that day.  On April 11, 2001, the patient was discharged home with no recurrent chest pain or shortness of breath.  Vital signs remained stable.  DISCHARGE MEDICATIONS: 1. Aspirin 325 mg q.d. 2. Plavix 75 mg q.d. 3. Lopressor 50 mg one-half q.d. 4. Pravachol 40 mg q.d. 5. Prinivil 20 mg one-half q.d. 6. Nitroglycerin 0.4 mg p.r.n.  DISCHARGE INSTRUCTIONS:  She is not to engage in any strenuous activity for the next two to three days.  She is not to do any heavy lifting or lift anything more than five pounds.  She is not to drive or engage in sexual activity for the next two to three days.  She is to stop smoking.  She is to maintain a low salt, low fat, and low cholesterol diet. She may shower or bathe once she gets home.  However, she needs to be careful and do general cleaning to the groin cath site.  If she has any increased pain, swelling, or bruising  to the right groin cath site, she is to contact  Dr. Clarene Duke.  She is to follow up with Dr. Clarene Duke two weeks after discharge. She is to call for an appointment.  She is not to resume her work for four weeks. Dictated by:   Adrian Saran, N.P. Attending Physician:  Loreli Dollar DD:  04/18/01 TD:  04/18/01 Job: [phone]

## 2010-12-08 NOTE — Consult Note (Signed)
[place]. [facility]  Patient:    [name] Page, [name] Page                      MRN: [id number] Proc. Date: 10/08/99 Adm. Date:  [id number] Attending:  Gilmer Mor, M.D.             William A. Leveda Anna, M.D.             Thereasa Solo. Little, M.D.                          Consultation Report  DATE OF BIRTH:  Mar 29, 1946  REASON FOR CONSULTATION:  Small bowel obstruction.  HISTORY OF PRESENT ILLNESS:  Ms. [name] Page is a 65 year old [place] female who is a patient of Dr. Owens Loffler of the [facility].  She started on about Wednesday, October 04, 1999, with some vague abdominal pain.  She was able to continue to eat until Friday evening, when she had increasing nausea with eating.  Because of increasing abdominal pain, nausea, vomiting, she presented o the [facility] Emergency Room on Saturday, October 03, 1999. She has had a prior history of bowel obstruction, treated in August 1990, expectantly, and she thought that this felt like her last obstruction.  She has no history of peptic ulcer disease, no history of liver disease, gallbladder disease, pancreatic disease, or change in bowel habits.  She does give a history of a cesarean section about 23 years ago.  Apparently postoperatively she had a wound dehiscence which required a second closure.  Her last bowel movement was reported, and was small.  ALLERGIES:  PENICILLIN, which makes her swell.  CURRENT MEDICATIONS: 1. Hydrochlorothiazide 25 mg q.d. 2. Prinivil 20 mg q.d. 3. ________ one tablet q.d.  REVIEW OF SYSTEMS:  Neurologic:  No history of seizures or loss of consciousness. Pulmonary:  She smokes cigarettes.  Knows this is bad for her health.  Is plagued both with chronic sinusitis, and has had some bronchitis with history of pneumonia in the remote past.  Cardiac:  She had a myocardial infarction several years ago, which  required a cardiac catheterization with angioplasty by Dr. Gaspar Garbe B. Little. She has had hypertension for some 10 years.  She has been followed by Dr. Clarene Duke for her heart disease.  Gastrointestinal:  No history of peptic ulcer disease, liver disease, colon disease.  Again, her only prior surgery was the C-section.  She has had a prior bowel obstruction.  Urologic:  No history of kidney stones r kidney infections.  PHYSICAL EXAMINATION:  VITAL SIGNS:  Weight about 152 pounds, temperature 100 degrees, pulse 103, respirations 28, blood pressure 162/88.  GENERAL:  She is a well-nourished [place] female, alert and cooperative on physical examination.  LUNGS:  Clear to auscultation.  HEART:  A regular rate and rhythm without murmur or rub.  BREASTS:  Symmetrical.  I feel no masses.  She has had a mammogram within the last year or two.  ABDOMEN:  A well-healed lower midline abdominal incision.  She has some tenderness on both sides, but no guarding, no rebound.  I find no organomegaly, no obvious  abdominal hernia.  RECTAL:  Guaiac-negative stools.  IMPRESSION: 1. Small bowel obstruction by CT scan.  It looks like the cutoff point is along    her lower midline abdominal scar.  The CT scan appears to show this as a very    complete obstruction, though with the patients previous success and the small    bowel obstruction resolving with medical management, she wanted to try this    first. 2. History of myocardial infarction, followed by Dr. Caprice Kluver. 3. Hypertension. 4. History of smoking cigarettes. 5. Hypercholesterolemia.  CURRENT PLAN:  NG tube, IV fluids, n.p.o. status.  Agree with the current management decisions.  Follow up labs and x-rays.  I discussed with the patient  that if she does not resolve over a period of one to three days, she may require an abdominal exploration. DD:  10/08/99 TD:  10/08/99 Job: [id number]

## 2010-12-08 NOTE — Cardiovascular Report (Signed)
[name] Page. [facility]  Patient:    [name] Page, [name] Page Visit Number: [id number] MRN: [id number]          Service Type: MED Location: CCUA 2923 01 Attending Physician:  Loreli Dollar Dictated by:   Julieanne Manson, M.D. Proc. Date: 04/09/01 Admit Date:  04/09/2001   CC:         Cardiac Catheterization Laboratory  Emergency Room   Cardiac Catheterization  INDICATIONS FOR TESTS:  [name] is a 65 year old female who has a prior history of coronary disease.  She had mild chest pain last night and did not seek medical attention.  Unfortunately, this morning while at work she had an episode of prolonged severe chest pain, became unresponsive, and was brought to the emergency room from her job on the fourth floor in the same hospital and her blood pressure was in the 60s to 70s and she was unresponsive.  Her blood pressure was stabilized with IV fluids and Dopamine and she was rushed to the cardiac catheterization laboratory.  Her EKG showed ST segment depression in 1 and ______, consistent with a lateral event.  DESCRIPTION OF PROCEDURE:  The patient was prepped and draped in the usual sterile fashion, exposing the right groin.  Following local anesthetic with 1% Xylocaine, the Seldinger technique was employed and a 7-French introducer sheath was placed in the right femoral artery and a 5-French introducer sheath in the right femoral vein.  Left and right coronary arteriography and emergency intervention to the RCA and ventriculography at the termination of the procedure was performed.  COMPLICATIONS:  None.  RESULTS:    I. HEMODYNAMIC MONITORING:  Central aortic pressure 107/66.  Left      ventricular pressure 106/39.   II. VENTRICULOGRAPHY:  Ventriculography in the RAO projection done at the      end of the case, showed normal left ventricular systolic function with      normal inferior wall motion.  The end-diastolic pressure was elevated  at 32.  III. CORONARY ARTERIOGRAPHY:          a. Left main:  Normal.          b. Left anterior descending:  The LAD cross the apex of the heart and             supplied the distal posterior wall.  Two moderate sized diagonals             were noted and the system was free of disease.          c. Optional diagonal:  Normal.          d. Circumflex:  The circumflex had two large OM vessels and a very             small insignificant third OM vessel with irregularities.  This             vessel was too small for intervention.  The circumflex system             overall was basically normal and the prior angioplasty site was             free of disease.          e. Right coronary artery:  The right coronary artery was totally             occluded in its mid portion, with a 40% hazy area in the proximal  segment and another more proximal segment of mild irregularities.             When the guide wire was placed down the right coronary artery, it             appears that there was a proximal dissection at the 40% area of             narrowing that now became 70%.  It did not, however, abruptly             close.  EMERGENCY INTERVENTION:  Emergency intervention was performed with a JR-4 7-French guide catheter, 180 cm luge wire, and initially a 2.5 x 15 mm CrossSail balloon was used.  The wire was placed down the right coronary artery and across the area of the total obstruction and out the posterior descending artery.  The CrossSail balloon was placed in the area of total obstruction and a single inflation of 8 atm x 45 seconds was performed.  The balloon was then pulled back in the proximal segment where the dissection occurred and a single inflation of 9 atm x 45 seconds was performed.  Following this, stenting to both lesions was undertaken.  The mid lesion was long in appearance and a 3.0 x 24 mm Express balloon was placed and inflated at 14 atm x 64 seconds.  Following this  inflation, this area appeared to be normal with brisk distal flow.  The proximal lesion was then addressed and a 3.0 x 12 mm Express stent was used.  It was placed in such a manner that the lesion was well covered and deployed at 15 atm x 72 seconds.  This also appeared to be normal following intervention.  There was brisk distal flow and there was no evidence of distal embolization or now flow situation.  The patient will be maintained on Integrilin x 24 hours.  She was given 150 mg of IV Plavix in the catheterization laboratory.  We will restart her beta-blockers today and try to restart ACE inhibitors tomorrow if her blood pressure will allow this. Dictated by:   Julieanne Manson, M.D. Attending Physician:  Loreli Dollar DD:  04/09/01 TD:  04/09/01 Job: [id number]

## 2010-12-08 NOTE — Procedures (Signed)
[facility]  Patient:    [name] Page, [name] Page Visit Number: [id number] MRN: [id number]          Service Type: END Location: ENDO Attending Physician:  Louie Bun Proc. Date: 06/06/01 Admit Date:  06/06/2001   CC:         Asencion Partridge, M.D.   Procedure Report  PROCEDURE:  Esophagogastroduodenoscopy with esophageal dilatation.  INDICATION FOR PROCEDURE:  Complaints of dysphagia and chronic dyspepsia with family history of esophageal cancer.  DESCRIPTION OF PROCEDURE:  The patient was placed in the left lateral decubitus position and placed on the pulse monitor with continuous low-flow oxygen delivered by nasal cannula.  She was sedated with 50 mg IV Demerol and 4 mg IV Versed.  The Olympus video endoscope was advanced under direct vision into the oropharynx and esophagus.  The esophagus was straight and of normal caliber with the squamocolumnar line at 36 cm above a 2 cm sliding hiatal hernia.  I did not see any stricture or ring, even when the LES was fully relaxed.  There was no visible esophagitis.  The stomach was entered, and a small amount of liquid secretions were suctioned from the fundus.  Retroflexed view of the cardia was unremarkable.  The fundus and body appeared normal. The antrum showed mild gastritis.  The pylorus was nondeformed and easily allowed passage of the endoscope tip into the duodenum.  The bulb showed some mild erythema and granularity consistent with duodenitis.  In the junction between the bulb and second portion, there was circumferential narrowed area with overlying exudate which was strictured to the point where the standard scope would not pass through it, although I could clearly see the lumen beyond it.  At this point, the scope was withdrawn back into the antrum and a CLOtest obtained.  The scope was then advanced to the stricture, a Savary guidewire placed through it, and the scope withdrawn.  A single 16 mm  Savary dilator was passed due to her complaints of dysphagia over the guidewire and removed together with the wire.  The patient was then returned to the recovery room in stable condition.  She tolerated the procedure well, and there were no immediate complications.  IMPRESSION: 1. Postbulbar duodenal stricture, probably contributing to her dyspepsia. 2. No obvious esophageal ring or stricture, status post empiric esophageal    dilations due to complaints of dysphagia.  PLAN:  Await CLOtest and treat for eradication of Helicobacter if positive. Otherwise double her dose of Nexium and if no significant improvement in dysphagia, she could possibly need balloon dilatation or even ______ therapy for duodenal stricture. Attending Physician:  Louie Bun DD:  06/06/01 TD:  06/06/01 Job: [id number]

## 2010-12-08 NOTE — H&P (Signed)
[place]. [facility]  Patient:    [name] Page, [name] Page                      MRN: [id number] Adm. Date:  [id number] Attending:  Sanjuana Letters Dictator:   Guadalupe Dawn, M.D. CC:         Thereasa Solo. Little, M.D.             Jacobo Forest, M.D., [facility]                         History and Physical  PROBLEM LIST:  1. Acute abdominal pain with intractable nausea and vomiting.  2. Leukocytosis.  3. Hypertension.  4. Old myocardial infarction.  5. Tobacco abuse.  6. History of small-bowel obstruction secondary to adhesions in 1998 with     conservative medical management.  HISTORY OF PRESENT ILLNESS:  This 65 year old African-American patient presents  after having two days of mild cramping midabdominal pain.  Her daughter reports  that the pain became severe today and began to be associated with nonbilious emesis.  The pain is described as a cramping-like pain that is constant with intermittent worsening.  It is relieved in the left lateral decubitus position nd worsened with supine position.  She reports that she had a normal bowel movement early yesterday morning.  She did tolerated p.o. intake until the pain and emesis became severe.  She denies fever, chills.  She denies hematemesis or dysuria. he states, "It feels like the last obstruction."  PAST MEDICAL HISTORY:  See problem list.  MEDICATIONS:  1. Aspirin 325 mg q.d.  2. HCTZ 25 mg q.d.  3. Metoprolol 25 mg q.d.  4. Nasonex 1 puff b.i.d.  5. Nitroglycerin 0.4 mg sublingual p.r.n.  6. Pravachol (unknown dose).  7. Prilosec 20 mg q.d.  8. Prinivil 20 mg q.d.  9. Zyrtec 10 mg q.d.  ALLERGIES:  PENICILLIN.  SOCIAL HISTORY:  The patient works at [employer].  Lives with her children.  Admits to intermittent tobacco use of 1/2 pack per day.  Denies alcohol or drug abuse.  FAMILY HISTORY:  Father with a history of coronary artery disease and myocardial infarction, who died  secondary to faulty pacemaker.  REVIEW OF SYSTEMS:  Patient admits to feeling ill in general at the present time. Otherwise negative except for HPI.  OBJECTIVE:  VITAL SIGNS:  Temperature 97.1, pulse 98, blood pressure 162/95, respirations 22.  GENERAL:  She is mildly obese, African-American female lying in bed in moderate  distress secondary to pain.  She is lying on her left side.  She is alert and oriented x 3.  HEENT:  [place]/AT, PERRL, EOMI.  Sclerae anicteric.  Oropharynx normal.  NECK:  Supple.  No lymphadenopathy.  No JVD.  No thyromegaly.  LUNGS:  Clear to auscultation bilaterally.  HEART:  Regular rhythm, no murmur.  ABDOMEN:  Moderately distended, exquisitely tender diffusely especially periumbilically and in the epigastrium.  Markedly decreased bowel sounds globally. No rebound.  There is bilateral CVA tenderness.  GENITOURINARY:  Deferred at the present time.  RECTAL:  Heme negative stool.  Nontender examination.  EXTREMITIES:  No edema.  No lesions.  NEUROLOGIC:  Grossly intact.  Gait not tested.  LABORATORY:  White blood cells 12,400, hemoglobin 14.5, hematocrit 41.8, platelet 245,000.  Sodium 139, potassium 3.4, chloride 99, bicarb 32, BUN 18, creatinine 0.7, glucose 103, lipase 20, amylase 74, alkaline phosphatase 65,  total bilirubin 0.9, albumin 4.6, calcium 10.1, AST 35, ALT 25.  Acute abdominal series shows no obstruction, otherwise normal. Electrocardiogram shows normal sinus rhythm with no acute changes, rate of 80.  ASSESSMENT AND PLAN:  1. Abdominal pain.  Current etiology unclear and awaiting computed tomography     results for help.  This clinical picture and history are concerning for     mesenteric ischemia versus acute obstruction that is not obvious on the plain     films.  At this time we will make her NPO and give her analgesia with morphine.     We will give Phenergan and Pepcid IV.  The patient will need a pelvic exam f     no  definite etiology on the CT scan.  Possibly a surgical consult tonight if     indicated.  Question benefit of nasogastric tube without obstruction.  2. Coronary artery disease.  Holding beta blocker for now secondary to NPO status.     Will restart if anginal symptoms.  Will continue p.r.n. nitroglycerin.  Her     electrocardiogram was normal.  We will contact Dr. Clarene Duke if necessary for     cardiology consultation.  3. Hypertension, currently exacerbated by pain.  Will follow and restart home     medications when she is taking p.o.  Treat acutely with IV medicines as needed. DD:  10/08/99 TD:  10/08/99 Job: [id number]

## 2010-12-08 NOTE — Discharge Summary (Signed)
[place]. [facility]  Patient:    [name] Page, [name] Page Visit Number: [id number] MRN: [id number]          Service Type: MED Location: CCUA 2923 01 Attending Physician:  Loreli Dollar Dictated by:   Adrian Saran, N.P. Admit Date:  04/09/2001 Discharge Date: 04/11/2001   CC:         [facility]   Discharge Summary  ADMISSION DIAGNOSES: 1. Acute anterior myocardial infarction. 2. Coronary artery disease status post inferior myocardial infarction 1997,    status post percutaneous transluminal coronary angioplasty to the right    coronary artery July 24, 1995, status post percutaneous transluminal    coronary angioplasty to the circumflex August 12, 1995. 3. Mitral valve prolapse. 4. Hyperlipidemia.  DISCHARGE DIAGNOSES: 1. Coronary artery disease status post acute anterior myocardial infarction    April 09, 2001 with percutaneous transluminal coronary angioplasty and    stent placement x 2 to the right coronary artery, status post inferior    myocardial infarction 1997 with percutaneous transluminal coronary    angioplasty to the right coronary artery, status post percutaneous    transluminal coronary angioplasty to the circumflex. 2. Hyperlipidemia. 3. Mitral valve prolapse. 4. History of positive PPD in the past.  PROCEDURE:  Cardiac catheterization with percutaneous transluminal coronary angioplasty and stent placement x 2.  COMPLICATIONS:  None.  DISCHARGE STATUS:  Stable, improved.  ADMISSION HISTORY:  This was a 65 year old [place] female who came to the emergency room with acute onset of chest pain.  Apparently on the night prior to admission she had what she described as a strange "sensation" to her mid sternal region.  It then developed into what she described as a heaviness and she felt as if she was having indigestion type symptoms.  This began around 5 p.m. that evening and lasted through until the next morning.  She  states the pain severity would increase and decrease throughout the night.  When she arrived at work the next morning she was continuing to have pain; however, it became much worse.  She arrived at work around 6:30 a.m. and works here at this facility.  Upon arriving on her floor she developed feelings of near syncope and had to sit down.  She had severe diaphoresis at this point and was brought to the emergency room.  She was very lethargic at this point and developed hypotension and some mild bradycardia.  She received a fluid bolus as well as started on IV dopamine. She was given MF for her chest discomfort.  At this point she developed some nausea and vomiting.  She was diagnosed as having an acute anterior myocardial infarction and was emergently taken to the catheterization laboratory.  PHYSICAL EXAMINATION  VITAL SIGNS:  Blood pressure initially 70 systolic with heart rate in the 50s-60.  With starting dopamine her blood pressure improved to 110-120 systolic, heart rate at 81.  GENERAL:  She was extremely diaphoretic, very drowsy and lethargic.  LUNGS:  Clear to auscultation bilaterally.  HEART:  Regular rate and rhythm.  No murmur or gallop noted.  EXTREMITIES:  No edema noted to the extremities.  ABDOMEN:  Soft and nontender.  Normal bowel sounds.  LABORATORIES:  EKG showed ST depression in the inferior leads.  Chest x-ray showed some probable mild interstitial edema.  Normal CBC and CMP with the exception of elevated glucose at 261.  LFTs were normal.  Total CK with MB on admission was 133 and 2.3.  Troponin  was 0.06. Repeat CK-MB later on the day of admission showed a total of 207 with 222.9 MBs.  Fasting lipid panel showed total cholesterol 140, HDL 39, LDL 52, triglycerides elevated 295.  HOSPITAL COURSE:  Patient was taken emergently from the emergency room to the cardiac catheterization laboratory for emergency catheterization and possible intervention.  It was  found that she had normal left main.  LAD was free of disease as was the optional diagonal.  The circumflex was basically normal and the prior angioplasty site was free of disease.  There was a very small third OM that had some irregularities; however, this vessel was too small for intervention.  The RCA was totally occluded at the mid section.  There was also an area in the proximal segment that had approximately 40% narrowing.  Emergency intervention was performed at this time.  One stent was placed to the mid portion and a second stent was placed through the proximal segment. PTCA was performed following stent placement.  She was placed on Integrilin for 24 hours.  She was started on Plavix IV in the cardiac catheterization laboratory.  She was admitted to the unit from the catheterization laboratory.  She did tolerate the procedure well.  Blood pressure and heart rate remained stable. She did complain of some mild left posterior scapular pain the night post catheterization.  This resolved the next day and did not recur.  Her ACE inhibitor was restarted on that day.  On April 11, 2001 patient was discharged home with no recurrent chest pain or shortness of breath.  Vital signs remained stable.  DISCHARGE MEDICATIONS: 1. Aspirin 325 mg q.d. 2. Plavix 75 mg q.d. 3. Lopressor 50 mg one-half q.d. 4. Pravachol 40 mg q.d. 5. Prinivil 20 mg one-half q.d. 6. Nitroglycerin 0.4 mg p.r.n.  ACTIVITY:  She is no to engage in any strenuous activity for the next two to three days.  She is not to do any heavy lifting or lift anything more than 5 pounds.  She is not to drive or engage in sexual activity for the next two to three days.  She is to stop smoking.  DIET:  She is maintain a low salt, low fat, low cholesterol diet.  WOUND CARE:  She may shower or bathe once she gets home; however, she needs to be careful and do gentle cleaning to the groin catheterization site.   DISCHARGE  INSTRUCTIONS:  If she has any increased pain, swelling, or bruising to the right groin catheterization site she is to contact Dr. Clarene Duke.  FOLLOW-UP:  She is to follow-up with Dr. Clarene Duke two weeks after discharge. She is to call for an appointment.  She is not to resume her work for four weeks. Dictated by:   Adrian Saran, N.P. Attending Physician:  Loreli Dollar DD:  04/18/01 TD:  04/18/01 Job: [id number]

## 2010-12-08 NOTE — Procedures (Signed)
[facility]  Patient:    [name] Page, [name] Page Visit Number: [id number] MRN: [id number]          Service Type: END Location: ENDO Attending Physician:  Louie Bun Dictated by:   Everardo All Madilyn Fireman, M.D. Proc. Date: 06/06/01 Admit Date:  06/06/2001   CC:         Camille L. Mardelle Matte, M.D.   Procedure Report  PROCEDURE:  Colonoscopy.  ENDOSCOPIST:  Everardo All. Madilyn Fireman, M.D.  INDICATION FOR PROCEDURE:  Family history of colon cancer in a first degree relative.  DESCRIPTION OF PROCEDURE:  The patient was placed in the left lateral decubitus position and placed on the pulse monitor, with continuous low-flow oxygen delivered by nasal cannula.  She was sedated with 30 mg IV Demerol and 3 mg IV Versed.  The Olympus video colonoscope was inserted into the rectum and advanced to what was felt to be the vicinity of the proximal ascending colon or cecum but at this point, the prep was extremely poor with solid adherent stool coating the walls completely, with partial coating in the distal ascending colon, thus the cecum was really not examined, although we did achieved transillumination of McBurneys point, and there was only limited examination of the ascending colon.  The areas that were seen revealed unremarkable mucosa, as did the transverse, descending and sigmoid colon; specifically, no polyps, diverticula or mucosal abnormalities were seen.  The rectum appeared normal.  On retroflexed view, the anus did reveal some small internal hemorrhoid with no stigma of hemorrhage.  The colonoscope was then withdrawn and the patient returned to the recovery room in stable condition. She tolerated the procedure well and there were no immediate complications.  IMPRESSION:  Internal hemorrhoids, otherwise, normal colonoscopy, with cecum unable to be examined due to extremely poor prep and a limited examination of the ascending colon.  PLAN:  Will repeat colonoscopy in three  years with polyethylene glycol prep. Dictated by:   Everardo All Madilyn Fireman, M.D. Attending Physician:  Louie Bun DD:  06/06/01 TD:  06/06/01 Job: [id number]

## 2010-12-08 NOTE — Discharge Summary (Signed)
[place]. [facility]  Patient:    [name] Page, [name] Page                      MRN: [id number] Adm. Date:  [id number] Disc. Date: [id number] Attending:  Sanjuana Letters Dictator:   Lyndee Leo. Janey Greaser CC:         Sandria Bales. Ezzard Standing, M.D.                           Discharge Summary  BRIEF HISTORY:  This is a 65 year old, [name] Page female who was admitted with abdominal pain x 2 days on October 08, 1999.  She had some non-bilious vomiting and has a history of previous small bowel obstructions.  Stated that this felt the same.  The patient was admitted to the hospital.  Had a CT scan, which revealed mid small bowel obstruction with a cut off in the mid abdomen.  The patient has a history of a C-section with wound dehiscence.  ADMISSION LABORATORY DATA:  CBC:  White count 12.4, hemoglobin 14.5, hematocrit 41.8, and platelets 245.  BMET:  Sodium 139, potassium 3.4, chloride 99, CO2 32, BUN 18, creatinine 0.7, and glucose 103.  Lipase was 20, amylase 74, alkaline phosphatase was 65, total bilirubin 0.9, albumin 4.6, AST 35, and ALT 25.  HOSPITAL COURSE:  The patient had a NG tube placed, was given n.p.o., and also Dr. Ezzard Standing from general surgery was called to consult on the patient, who agreed with continuing with NG tube, IV fluids, and keeping her n.p.o.  The patient did well over the hospital course.  Slowly had some resolvement of her small bowel obstruction.  She was slowly titrated from n.p.o. to sips of clears, then clears, then soft diet.  The patient, at the time of discharge, had eaten a full meal, and had had bowel movements and flatus.  On October 11, 1999, the patient had a repeat CT, which showed resolvement of her small bowel obstruction.  NG tube was pulled.  On this day, started sips and clears.  The patient tolerated this well, and again, at the time of discharge, was taking normal p.o. diet with BMs and flatus.  The patient will be discharged on October 13, 1999.  DISCHARGE DIAGNOSIS:  Small bowel obstruction.  DISCHARGE PLAN:  The patient already has a scheduled appointment with Dr. Julian Reil at the [facility] scheduled in the next 10-14 days. We will keep that appointment.  DISCHARGE MEDICATIONS: 1. Hydrochlorothiazide 25 mg p.o. q.d. 2. Lopressor 25 mg p.o. q.d. 3. Prinivil 20 mg p.o. q.d. 4. Pepcid 20 mg p.o. b.i.d. 5. Aspirin 325 mg p.o. q.d. 6. Zyrtec 10 mg p.o. q.d. 7. Pravachol, unknown dose.  Will have primary physician determine. 8. Nasonex 1 puff b.i.d.  ATTENDING PHYSICIAN:  Santiago Bumpers. Leveda Anna, M.D.  PRIMARY CARE PHYSICIAN:  Jacobo Forest, M.D. of the [facility].  FOLLOWUP:  Dr. Ezzard Standing, at the time of discharge, did not feel like the patient needed to follow up with him in the clinic, and she will continue her medical management with Dr. Julian Reil at the [facility] DD:  10/13/99 TD:  10/13/99 Job: [id number]

## 2010-12-08 NOTE — H&P (Signed)
[facility]  Patient:    [name] Page, [name] Page Visit Number: [id number] MRN: [id number]          Service Type: MED Location: [facility] 2923 [id number] Attending Physician:  Loreli Dollar Admit Date:  04/09/2001   CC:         [facility]   History and Physical  HISTORY OF PRESENT ILLNESS:  This is a 65 year old [place] female who presents to the emergency room with acute onset of chest pain.  She states on February 05, 2001 she began developing what she described as a "strange" sensation to her midsternal region.  It then subsequently developed into a heaviness and what she thought was indigestion-type symptoms around 5 p.m.  She states she had no appetite whatsoever to eat her dinner; however, forced herself to eat a little something due to a need to take her regular medications.  She states this midsternal heaviness continued for the rest of the evening to the point where she was unable to sleep except for about two hours due to the pain.  There was no radiation of pain.  She said she did have some diaphoresis accompanying this pain.  She denied any nausea or shortness of breath.  No awareness of tachycardia or arrhythmias.  The pain was constant until the next morning, somewhat waxing and waning in severity.  She came to work here at [employer] on July 18 and arrived around 6:30 a.m. She states her pain dramatically increased in severity at that time to the point where when she arrived on her floor for work she had to sit down secondary to feeling extremely light-headed and near syncopal.  She again had severe diaphoresis.  At this point she was brought to the emergency room for treatment and evaluation.  Apparently in the emergency room  she was somewhat unresponsive.  Blood pressure in the emergency room was around 70 systolic with a heart rate in the 50s and 60s.  In the ER she received D-50 as well as placed on IV dopamine and heparin.  She was  given a fluid bolus and morphine for her discomfort.  She developed nausea and vomiting in the emergency room and was then given Zofran. EKG showed ST depression in the inferior leads.  Her blood pressure picked up to around 110-120 systolic with dopamine at 6 drops.  Heart rate improved to a rate of 81.  At this point, she was suspected to have an acute MI to the distal circumflex and was taken emergently to the cardiac catheterization laboratory.  PAST MEDICAL HISTORY: 1. CAD.  She is status post inferior MI on July 24, 1995.  Cardiac    catheterization at that time revealed an 80% lesion of the proximal    circumflex, 95% lesion to the proximal RCA, and a 40-50% area mid to    distal RCA.  She underwent PTCA to the RCA at that time.  She also    underwent additional PTCA to the circumflex on August 12, 1995.  Her    last evaluatory test was an exercise Cardiolite nuclear study which was    done on March 01, 2000 which was negative for ischemia and showed    normal LV function with an EF of 70%. 2. History of MVP. 3. Hyperlipidemia. 4. History of positive PPD in the past.  OUTPATIENT MEDICATIONS: 1. Hydrochlorothiazide 25 mg q.d. 2. Aspirin q.d. 3. Lopressor 50 mg one-half b.i.d. 4. Nitroglycerin sublingual p.r.n. 5. Vitamin  E q.d. 6. Pravachol 40 mg q.d. 7. Prinivil 20 mg q.d. 8. Zyrtec p.r.n.  ALLERGIES:  PENICILLIN.  FAMILY HISTORY:  Her father died at age 33 as a result of heart failure.  Also with a history of hypertension.  Her mother died at age 17 from CHF.  She had one sister who died at age 83 from esophageal CA.  She has 11 additional siblings for who she states most all have a history of hypertension.  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  She is divorced.  She is the mother of two.  Admits to occasional alcohol.  She smokes one-half pack of cigarettes a day.  She states she is trying to follow a low  fat/low cholesterol diet.  REVIEW OF SYSTEMS:  She states she had been having some left arm weakness and "unusual sensations" to her left arm for approximately the last two weeks. Chest pain for the last two days as described above.  Denies any problems with shortness of breath, PND, or orthopnea.  Denies any abdominal pain, heartburn, or indigestion.  Denies any nausea, vomiting, diarrhea, or constipation. Denies any signs and symptoms of bleeding, i.e. melena, hematuria, or bleeding gums.  Denies any problems with headaches, unilateral weakness, dizziness, or near syncope.  Lightheadedness x 1 day as described above.  Near syncope yesterday as described above.  Denies any paresthesias to the extremities.  PHYSICAL EXAMINATION ON ADMISSION:  VITAL SIGNS:  As described above.  LUNGS:  Clear to auscultation bilaterally.  HEART:  Regular rate and rhythm.  No murmur or gallop.  ABDOMEN:  Soft, nontender.  Normal bowel sounds in all quadrants.  No hepatosplenomegaly.  EXTREMITIES:  Nonedematous with good pulses bilaterally.  GENERAL:  Patient was alert but very drowsy.  Extreme diaphoresis noted.  LABORATORY DATA:  EKG showed inferior ST depression.  She was on IV dopamine and heparin.  ASSESSMENT AND PLAN: 1. Anterior myocardial infarction.  She was taken emergently to the    catheterization laboratory for further treatment and evaluation.  IV    Integrilin was started at that point.  She is on aspirin and a beta    blocker and a statin. 2. Coronary artery disease status post inferior myocardial infarction in 1997    with percutaneous transluminal coronary angioplasty to the right    coronary artery on July 24, 1995 and percutaneous transluminal    coronary angioplasty to the circumflex on August 12, 1995. 3. History of mitral valve prolapse. 4. Hyperlipidemia on a statin.  Patient will be admitted to CCU status post catheterization. Attending Physician:  Loreli Dollar DD:  04/10/01 TD:  04/10/01 Job: [phone]  [id number]

## 2011-01-11 ENCOUNTER — Other Ambulatory Visit: Payer: Self-pay | Admitting: Family Medicine

## 2011-01-11 MED ORDER — PRAVASTATIN SODIUM 40 MG PO TABS: 40.0000 mg | ORAL_TABLET | Freq: Every day | ORAL | Status: DC

## 2011-03-13 ENCOUNTER — Ambulatory Visit
Admission: RE | Admit: 2011-03-13 | Discharge: 2011-03-13 | Disposition: A | Discharge status: Home / Self Care | Payer: Medicare Other | Source: Ambulatory Visit | Attending: Family Medicine | Admitting: Family Medicine

## 2011-03-13 ENCOUNTER — Encounter: Payer: Self-pay | Admitting: Family Medicine

## 2011-03-13 ENCOUNTER — Ambulatory Visit (INDEPENDENT_AMBULATORY_CARE_PROVIDER_SITE_OTHER): Payer: Medicare Other | Admitting: Family Medicine

## 2011-03-13 DIAGNOSIS — J40 Bronchitis, not specified as acute or chronic: Secondary | ICD-10-CM

## 2011-03-13 DIAGNOSIS — I10 Essential (primary) hypertension: Secondary | ICD-10-CM

## 2011-03-13 DIAGNOSIS — R05 Cough: Secondary | ICD-10-CM

## 2011-03-13 LAB — CBC WITH DIFFERENTIAL/PLATELET
Basophils Absolute: 0 10*3/uL (ref 0.0–0.1)
Basophils Relative: 1 % (ref 0–1)
Eosinophils Relative: 1 % (ref 0–5)
HCT: 42.2 % (ref 36.0–46.0)
Lymphocytes Relative: 32 % (ref 12–46)
MCHC: 33.9 g/dL (ref 30.0–36.0)
MCV: 108.5 fL — ABNORMAL HIGH (ref 78.0–100.0)
Monocytes Absolute: 0.5 10*3/uL (ref 0.1–1.0)
Neutro Abs: 3.4 10*3/uL (ref 1.7–7.7)
Platelets: 233 10*3/uL (ref 150–400)
RDW: 12.9 % (ref 11.5–15.5)
WBC: 5.9 10*3/uL (ref 4.0–10.5)

## 2011-03-13 LAB — COMPREHENSIVE METABOLIC PANEL
ALT: 12 U/L (ref 0–35)
AST: 23 U/L (ref 0–37)
Albumin: 4.6 g/dL (ref 3.5–5.2)
Alkaline Phosphatase: 47 U/L (ref 39–117)
BUN: 14 mg/dL (ref 6–23)
CO2: 26 mEq/L (ref 19–32)
Calcium: 9.8 mg/dL (ref 8.4–10.5)
Chloride: 100 mEq/L (ref 96–112)
Creat: 0.79 mg/dL (ref 0.50–1.10)
Glucose, Bld: 86 mg/dL (ref 70–99)
Potassium: 4 mEq/L (ref 3.5–5.3)
Sodium: 138 mEq/L (ref 135–145)
Total Bilirubin: 0.8 mg/dL (ref 0.3–1.2)
Total Protein: 7.4 g/dL (ref 6.0–8.3)

## 2011-03-13 MED ORDER — AZITHROMYCIN 250 MG PO TABS: ORAL_TABLET | ORAL | Status: AC

## 2011-03-13 NOTE — Assessment & Plan Note (Addendum)
Elevated today. Pt noted to be taking selective NSAID (celebrex) daily. I suspect that this may be contributing to elevated BP.  Pt instructed to hold celebrex in setting of current bronchitis. Will follow up in 1 week to reassess BP when pt off of medication.  Will check Cr and K today as pt is noted to be on ARB, and diuretic in setting of celebrex treatment.

## 2011-03-13 NOTE — Patient Instructions (Signed)
It was good to meet you today  I am getting a chest x-ray to make sure that there is not a pneumonia causing your cough I am placing you on antibiotics and I am also check some blood work Discontinue the celebrex for the next week and use tylenol instead for your hand pain  I will contact you with the results of your studies.  Come back to see me in 1 week for a follow up visit,  Call with any questions,  God Bless,  Doree Albee MD

## 2011-03-13 NOTE — Assessment & Plan Note (Addendum)
Will cover for atypical source with azithromycin. Will also obtain CXR and CBC. There may be  a contribution of reflux to clinical picture as pt reports cough at night and associated with meals. Currently on max dose nexium. Pt also on cox-2 inhibitor. I suspect there may be enough cox-1 overlap with this medication that may be causing a mild gastritis. Instructed pt to hold celebrex x 1 week. Discussed red flags for return. Will follow up in 1 week.  Also encouraged smoking cessation

## 2011-03-13 NOTE — Progress Notes (Signed)
  Subjective:    Patient ID: [name] Page, female    DOB: 08/01/1945, 65 y.o.   MRN: [id number]  HPI Pt is here for new MD visit. HTN: BPs in 140s-160s. No CP, SOB, HA. Pt states that she does eat salty foods.   Cough: x 3 weeks. Pt took some coricidin cold and cough with minimal improvementin sxs initially. Cough has still persisted. No increased WOB. + subjective fever x 1 night per pt, though pt's temperature was not taken. Cough worse with laying flat at night and when eating. Pt still smoking. Cough was initially productive of yellowish green sputum at beginning of sxs. No intermittent productive of whitish sputum. Previously with rhinorrhea. This has resolved.    Review of Systems See HPI     Objective:   Physical Exam Gen: up in chair, NAD HEENT: NCAT, EOMI, TMs clear bilaterally CV: RRR, no murmurs auscultated PULM: good overall air movement, faint rales in lower lobes bilaterally, no wheezes ABD: S/NT/+ bowel sounds  EXT: 2+ peripheral pulses  Assessment & Plan:

## 2011-03-14 ENCOUNTER — Encounter: Payer: Self-pay | Admitting: Family Medicine

## 2011-03-22 ENCOUNTER — Encounter: Payer: Self-pay | Admitting: Family Medicine

## 2011-03-22 ENCOUNTER — Ambulatory Visit (INDEPENDENT_AMBULATORY_CARE_PROVIDER_SITE_OTHER): Payer: Medicare Other | Admitting: Family Medicine

## 2011-03-22 VITALS — BP 117/72 | HR 102 | Temp 98.8°F | Wt 142.8 lb

## 2011-03-22 DIAGNOSIS — I10 Essential (primary) hypertension: Secondary | ICD-10-CM

## 2011-03-22 DIAGNOSIS — J40 Bronchitis, not specified as acute or chronic: Secondary | ICD-10-CM

## 2011-03-22 DIAGNOSIS — F17200 Nicotine dependence, unspecified, uncomplicated: Secondary | ICD-10-CM

## 2011-03-22 DIAGNOSIS — Z720 Tobacco use: Secondary | ICD-10-CM

## 2011-03-22 NOTE — Assessment & Plan Note (Addendum)
Improved today. Still question if celebrex is contributing some to this at least somewhat (in addition to exacerbating reflux). Instructed pt to take BPs daily. Also instructed pt to use celebrex on prn basis (pt previously taking daily).

## 2011-03-22 NOTE — Patient Instructions (Signed)
It was good to see you today I am glad that your cough and breathing are doing much better  Try using the celebrex only as needed for your arthritis as this medication may affect your stomach and blood pressure I also want to set you up for pulmonary function tests in light of your smoking history Come back to see me in 2 months,  Call with any other questions,  God Bless,  Doree Albee MD

## 2011-03-22 NOTE — Assessment & Plan Note (Addendum)
Much improved from previous visit. Pulse ox reassuring. In setting of + smoking hx, as well as faint wheezes, will set up for outpt PFTs to assess for any obstructive lung disease.

## 2011-03-22 NOTE — Progress Notes (Signed)
  Subjective:    Patient ID: [name] Page, female    DOB: May 11, 1946, 65 y.o.   MRN: [id number]  HPI Cough: This is a follow up for recent visit for bronchiitis. Pt recently treated for bronchiitis with atypical coverage with azithromycin. CXR negative, no leukoytosis on CBC. Today, pt feels a lot better. Cough is much improved. Cough was previously productive of yellowish sputum. This has since resolved. No increased WOB. Pt still smoking 2-3 cigarettes per day.   HTN: Not checking BPs daily. Most recently was in 140s per pt. Unsure of what BPs were off of celebrex. No CP, HA, SOB.    Review of Systems See HPI     Objective:   Physical Exam Gen: up in chair, NAD  HEENT: NCAT, EOMI, TMs clear bilaterally  CV: RRR, no murmurs auscultated  PULM: good overall air movement,very faint wheezes in apices bilaterally ABD: S/NT/+ bowel sounds    Assessment & Plan:

## 2011-03-22 NOTE — Assessment & Plan Note (Signed)
Discussed with pt. Currently smoking 2-3 cigarettes per day. Is contemplating quitting, but is not ready.

## 2011-03-30 ENCOUNTER — Encounter: Payer: Self-pay | Admitting: Pharmacist

## 2011-03-30 ENCOUNTER — Ambulatory Visit (INDEPENDENT_AMBULATORY_CARE_PROVIDER_SITE_OTHER): Payer: Medicare Other | Admitting: Pharmacist

## 2011-03-30 DIAGNOSIS — F17200 Nicotine dependence, unspecified, uncomplicated: Secondary | ICD-10-CM

## 2011-03-30 DIAGNOSIS — J40 Bronchitis, not specified as acute or chronic: Secondary | ICD-10-CM

## 2011-03-30 NOTE — Assessment & Plan Note (Signed)
Spirometry evaluation reveals Mild restrictive lung disease.  Patient has been experiencing long standing SOB and is not presently taking any medication for her breathing. no change to treatment plan at this time.  Reviewed results of pulmonary function tests.  Pt verbalized understanding of results.  Discussed benefits of quitting smoking. Written pt instructions provided.  F/U Clinic visit in October with Dr. Alvester Morin.   TTFFC 30 minutes.  Patient seen with Tomi Bamberger, PharmD Candidate and Benjaman Pott, Pharmacy Resident and Gaspar Bidding, DO.

## 2011-03-30 NOTE — Assessment & Plan Note (Signed)
Spirometry evaluation reveals Mild restrictive lung disease.  Patient has been experiencing long standing SOB and is not presently taking any medication for her breathing. no change to treatment plan at this time.  Reviewed results of pulmonary function tests.  Pt verbalized understanding of results.  Discussed benefits of quitting smoking. Written pt instructions provided.  F/U Clinic visit in October with Dr. Newton.   TTFFC 30 minutes.  Patient seen with Jesse Mack, PharmD Candidate and Kristina Sucic, Pharmacy Resident and Michael Rigby, DO.  

## 2011-03-30 NOTE — Progress Notes (Signed)
  Subjective:    Patient ID: [name] Page, female    DOB: 1946/06/12, 65 y.o.   MRN: [id number]  HPI Pt arrives in good spirits for PFTs after recent bronchitis treated with a Z-pak. Pt endorses some resolution of cough and is feeling better after taking the antibiotic. She is able to use her treadmill at home and can walk the same distance as before bronchitis. 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 [name] Page smokes 3-4 cigarettes per day and smokes in the house. She has been smoking since age 27 and has quit several times in the past for up to 3 years at a time. She claims the biggest barrier to setting a quit date is the persistence of her daughter in trying to make her quit smoking, to the point that she will light a cigarette in front of her daughter in order to make her quit "nagging."    Review of Systems     Objective:   Physical Exam        Assessment & Plan:  Spirometry evaluation reveals Mild restrictive lung disease.  Patient has been experiencing long standing SOB and is not presently taking any medication for her breathing. no change to treatment plan at this time.  Reviewed results of pulmonary function tests.  Pt verbalized understanding of results.  Discussed benefits of quitting smoking. Written pt instructions provided.  F/U Clinic visit in October with Dr. Alvester Morin.   TTFFC 30 minutes.  Patient seen with Tomi Bamberger, PharmD Candidate and Benjaman Pott, Pharmacy Resident and Gaspar Bidding, DO.

## 2011-03-30 NOTE — Progress Notes (Signed)
  Subjective:    Patient ID: MADESYN AST, female    DOB: 1946/05/20, 65 y.o.   MRN: [id number]  HPI Reviewed and agree with Dr. Macky Lower management    Review of Systems     Objective:   Physical Exam        Assessment & Plan:

## 2011-03-30 NOTE — Patient Instructions (Signed)
Lung function test showed Mild Restriction.  Quitting smoking is the most important thing to do for your lungs.   Take your Coreg ( carvedilol) twice daily.   Next visit with Dr. Alvester Morin.

## 2011-04-12 ENCOUNTER — Other Ambulatory Visit: Payer: Self-pay | Admitting: Family Medicine

## 2011-04-12 DIAGNOSIS — Z1231 Encounter for screening mammogram for malignant neoplasm of breast: Secondary | ICD-10-CM

## 2011-04-12 MED ORDER — ASPIRIN 325 MG PO TABS: 325.0000 mg | ORAL_TABLET | Freq: Every day | ORAL | Status: DC

## 2011-04-12 MED ORDER — CILOSTAZOL 100 MG PO TABS: 100.0000 mg | ORAL_TABLET | Freq: Two times a day (BID) | ORAL | Status: DC

## 2011-04-12 MED ORDER — AMLODIPINE BESYLATE 10 MG PO TABS: 10.0000 mg | ORAL_TABLET | Freq: Every day | ORAL | Status: DC

## 2011-04-25 ENCOUNTER — Other Ambulatory Visit: Payer: Self-pay | Admitting: Family Medicine

## 2011-04-25 ENCOUNTER — Ambulatory Visit
Admission: RE | Admit: 2011-04-25 | Discharge: 2011-04-25 | Disposition: A | Discharge status: Home / Self Care | Payer: Medicare Other | Source: Ambulatory Visit | Attending: Family Medicine | Admitting: Family Medicine

## 2011-04-25 DIAGNOSIS — Z1231 Encounter for screening mammogram for malignant neoplasm of breast: Secondary | ICD-10-CM

## 2011-05-23 ENCOUNTER — Ambulatory Visit (INDEPENDENT_AMBULATORY_CARE_PROVIDER_SITE_OTHER): Payer: Medicare Other | Admitting: Family Medicine

## 2011-05-23 ENCOUNTER — Encounter: Payer: Self-pay | Admitting: Family Medicine

## 2011-05-23 VITALS — BP 138/86 | HR 88 | Temp 98.4°F | Ht 63.0 in | Wt 146.0 lb

## 2011-05-23 DIAGNOSIS — I10 Essential (primary) hypertension: Secondary | ICD-10-CM

## 2011-05-23 DIAGNOSIS — F17200 Nicotine dependence, unspecified, uncomplicated: Secondary | ICD-10-CM

## 2011-05-23 DIAGNOSIS — Z23 Encounter for immunization: Secondary | ICD-10-CM

## 2011-05-23 DIAGNOSIS — E785 Hyperlipidemia, unspecified: Secondary | ICD-10-CM

## 2011-05-23 DIAGNOSIS — E7800 Pure hypercholesterolemia, unspecified: Secondary | ICD-10-CM

## 2011-05-23 DIAGNOSIS — Z720 Tobacco use: Secondary | ICD-10-CM

## 2011-05-23 MED ORDER — NICOTINE POLACRILEX 4 MG MT GUM: 4.0000 mg | CHEWING_GUM | OROMUCOSAL | Status: AC | PRN

## 2011-05-23 MED ORDER — PNEUMOCOCCAL VAC POLYVALENT 25 MCG/0.5ML IJ INJ
0.5000 mL | INJECTION | Freq: Once | INTRAMUSCULAR | Status: AC
  Administered 2011-05-23: 0.5 mL via INTRAMUSCULAR

## 2011-05-23 NOTE — Progress Notes (Signed)
  Subjective:    Patient ID: [name] Page, female    DOB: [birth year], 65 y.o.   MRN: [id number]  HPI Patient is here for general followup visit. Patient was recent seen by myself for acute bronchitis which are clinically resolved. In the setting or having some wheezing on exam as well as history of long-standing smoking, I set up patient for outpatient PFTs with Dr. Raymondo Band show patient had a pattern of restrictive lung disease versus COPD. Today patient that states the cough is all but subsided. Patient is still smoking up to 3 or 4 cigarettes per day.  Hypertension: Patient states she's been checking her blood pressures on a regular basis. Pt states that systolic blood pressures have not gone above 160 per patient.     Review of Systems See history of present illness, otherwise 12 point review systems negative    Objective:   Physical Exam Gen: up in chair, NAD HEENT: NCAT, EOMI, TMs clear bilaterally CV: RRR, no murmurs auscultated PULM: CTAB, no wheezes, rales, rhoncii ABD: S/NT/+ bowel sounds  EXT: 2+ peripheral pulses   Assessment & Plan:

## 2011-05-23 NOTE — Patient Instructions (Addendum)
It was good to see today Continue using your blood pressure medications as prescribed I am checking your cholesterol today I am also placing you nicotine gum to help her smoking cessation Come back to see me 6 weeks for followup visit Be sure to set up a followup appointment with Dr. Clarene Duke Call if any questions God Bless, Doree Albee MD

## 2011-05-24 NOTE — Assessment & Plan Note (Signed)
Pt is considering quitting and is willing to try nicotine gum (pt states she smokes 3-4 cigs per day)

## 2011-05-24 NOTE — Assessment & Plan Note (Signed)
Overall stable. Will continue current regimen. Will check Cr and K.

## 2011-05-24 NOTE — Assessment & Plan Note (Signed)
Will check lipids today. Currently on pravachol 40

## 2011-07-02 ENCOUNTER — Other Ambulatory Visit: Payer: Medicare Other

## 2011-07-02 DIAGNOSIS — E7800 Pure hypercholesterolemia, unspecified: Secondary | ICD-10-CM

## 2011-07-02 LAB — LIPID PANEL
Cholesterol: 184 mg/dL (ref 0–200)
Total CHOL/HDL Ratio: 3.2 Ratio

## 2011-07-02 NOTE — Progress Notes (Signed)
FLP DONE TODAY Thirza Pellicano 

## 2011-07-11 ENCOUNTER — Telehealth: Payer: Self-pay | Admitting: Family Medicine

## 2011-07-11 ENCOUNTER — Other Ambulatory Visit: Payer: Self-pay | Admitting: Family Medicine

## 2011-07-11 MED ORDER — PRAVASTATIN SODIUM 40 MG PO TABS: 40.0000 mg | ORAL_TABLET | Freq: Every day | ORAL | Status: DC

## 2011-07-11 MED ORDER — AMLODIPINE BESYLATE 10 MG PO TABS: 10.0000 mg | ORAL_TABLET | Freq: Every day | ORAL | Status: DC

## 2011-07-11 NOTE — Telephone Encounter (Signed)
Rxs changed to 90 day supply. Please contact pt. Thank you.

## 2011-07-11 NOTE — Telephone Encounter (Signed)
[name] Page need her rxs for Amlodipine and Pravastatin changed to 90 day supply instead of 30 days.  She [place] far out of pharmacy to have to come monthly to get her rxs.  Use to get 90 day before and don't understand why it's changed.  Please call patient back to discuss

## 2011-10-01 ENCOUNTER — Encounter: Payer: Self-pay | Admitting: Family Medicine

## 2011-10-01 ENCOUNTER — Ambulatory Visit
Admission: RE | Admit: 2011-10-01 | Discharge: 2011-10-01 | Disposition: A | Discharge status: Home / Self Care | Payer: Medicare Other | Source: Ambulatory Visit | Attending: Family Medicine | Admitting: Family Medicine

## 2011-10-01 ENCOUNTER — Ambulatory Visit (INDEPENDENT_AMBULATORY_CARE_PROVIDER_SITE_OTHER): Payer: Medicare Other | Admitting: Family Medicine

## 2011-10-01 DIAGNOSIS — J449 Chronic obstructive pulmonary disease, unspecified: Secondary | ICD-10-CM

## 2011-10-01 DIAGNOSIS — J984 Other disorders of lung: Secondary | ICD-10-CM | POA: Insufficient documentation

## 2011-10-01 DIAGNOSIS — F17200 Nicotine dependence, unspecified, uncomplicated: Secondary | ICD-10-CM

## 2011-10-01 DIAGNOSIS — I10 Essential (primary) hypertension: Secondary | ICD-10-CM

## 2011-10-01 LAB — BASIC METABOLIC PANEL
BUN: 14 mg/dL (ref 6–23)
CO2: 32 mEq/L (ref 19–32)
Chloride: 102 mEq/L (ref 96–112)
Creat: 0.55 mg/dL (ref 0.50–1.10)
Potassium: 4.1 mEq/L (ref 3.5–5.3)

## 2011-10-01 MED ORDER — AMLODIPINE BESYLATE 10 MG PO TABS: 10.0000 mg | ORAL_TABLET | Freq: Every day | ORAL | Status: DC

## 2011-10-01 MED ORDER — PREDNISONE 50 MG PO TABS: ORAL_TABLET | ORAL | Status: AC

## 2011-10-01 MED ORDER — DOXYCYCLINE HYCLATE 50 MG PO CAPS: 50.0000 mg | ORAL_CAPSULE | Freq: Two times a day (BID) | ORAL | Status: AC

## 2011-10-01 MED ORDER — CILOSTAZOL 100 MG PO TABS: 100.0000 mg | ORAL_TABLET | Freq: Two times a day (BID) | ORAL | Status: DC

## 2011-10-01 MED ORDER — NEOMYCIN-POLYMYXIN-HC 3.5-10000-1 OT SOLN: 3.0000 [drp] | Freq: Four times a day (QID) | OTIC | Status: AC

## 2011-10-01 NOTE — Assessment & Plan Note (Addendum)
Pt off of norvasc for the last week. Refilled norvasc today. Follow up on BP next week. Checking Cr and K today.

## 2011-10-01 NOTE — Assessment & Plan Note (Signed)
Clinical exacerbation today. Will place on course of prednisone and doxycycline for this. CXR to r/o PNA. Given orthopnea and hx/o coronary arteriosclerosis, will obtain BNP to eval for heart failure. No signs of volume overload on exam. If BNP elevated, set up for 2D ECHO.  Referring to pulmonology for further management in setting of restrictive lung disease.

## 2011-10-01 NOTE — Assessment & Plan Note (Signed)
Encouraged cessation  Pt not ready to quit

## 2011-10-01 NOTE — Patient Instructions (Addendum)
It  was good to see today I'm putting your medication for your breathing A like to also get a chest x-ray I've given recent any drops for your pain I am referring you to a lung doctor Come back to see me in one week Call if any questions or if your symptoms worsen God Bless,  Doree Albee MD

## 2011-10-01 NOTE — Assessment & Plan Note (Addendum)
Concominant COPD present. Pulse ox reassuring today. Referring to pulmonology to better assess disease state.

## 2011-10-01 NOTE — Progress Notes (Signed)
  Subjective:    Patient ID: [name] Page, female    DOB: Aug 16, 1945, 66 y.o.   MRN: [id number]  HPI Patient presents today with chief complaint of cough and increased sputum production. Patient states symptoms have been present for the last week. Patient has a baseline history of concomitant COPD and restrictive lung disease. This was formally diagnosed by PFTs in September 2012. Patient is also a a one half pack per day smoker. Patient denies any fevers or shortness of breath. No chest pain. Patient does report though subjective orthopnea including difficulty breathing while lying flat and having to sleep in a recliner daily. Patient denies any lower extremity swelling or history of heart failure.    Review of Systems See HPI, otherwise ROS negative     Objective:   Physical Exam Gen: up in chair, NAD HEENT: NCAT, EOMI, bilateral ear canal erythema and peripheral TM erythema, + bilateral pain with otoscope insertion.   CV: RRR, no murmurs auscultated PULM: CTAB, no wheezes, rales, rhoncii ABD: S/NT/+ bowel sounds  EXT: 2+ peripheral pulses   Assessment & Plan:

## 2011-10-02 ENCOUNTER — Telehealth: Payer: Self-pay | Admitting: *Deleted

## 2011-10-02 NOTE — Telephone Encounter (Signed)
Called patient and informed her of appointment with [facility], she said she is unable to make that appointment so I gave her the number to call and reschedule.Farrah Skoda, Rodena Medin

## 2011-10-04 ENCOUNTER — Institutional Professional Consult (permissible substitution): Payer: Medicare Other | Admitting: Internal Medicine

## 2011-10-10 ENCOUNTER — Encounter: Payer: Self-pay | Admitting: Family Medicine

## 2011-10-10 ENCOUNTER — Ambulatory Visit (INDEPENDENT_AMBULATORY_CARE_PROVIDER_SITE_OTHER): Payer: Medicare Other | Admitting: Family Medicine

## 2011-10-10 VITALS — BP 175/84 | HR 87 | Ht 62.0 in | Wt 148.0 lb

## 2011-10-10 DIAGNOSIS — H60399 Other infective otitis externa, unspecified ear: Secondary | ICD-10-CM

## 2011-10-10 DIAGNOSIS — F17200 Nicotine dependence, unspecified, uncomplicated: Secondary | ICD-10-CM

## 2011-10-10 DIAGNOSIS — J449 Chronic obstructive pulmonary disease, unspecified: Secondary | ICD-10-CM

## 2011-10-10 DIAGNOSIS — H6090 Unspecified otitis externa, unspecified ear: Secondary | ICD-10-CM

## 2011-10-10 DIAGNOSIS — I10 Essential (primary) hypertension: Secondary | ICD-10-CM

## 2011-10-10 MED ORDER — METOPROLOL SUCCINATE ER 25 MG PO TB24: 25.0000 mg | ORAL_TABLET | Freq: Every day | ORAL | Status: DC

## 2011-10-10 NOTE — Patient Instructions (Signed)
He was good to see today Changing your blood pressure medicine from Coreg to Toprol all to help her lung function Congratulations on trying to quit smoking Followup in 1-2 weeks for a nurse visit for your blood pressure recheck Otherwise come back to see me in 6 weeks Call if any questions God bless Doree Albee MD

## 2011-10-10 NOTE — Progress Notes (Signed)
S:  Patient is a 53 for general respiratory followup. Patient was recently seen by myself for COPD exacerbation. Patient was placed on prednisone as well as doxycycline for this. In the setting of her history of coronary artery disease, basic lab work as well as a BNP was checked to rule out heart failure. These labs were negative. Patient also had a concomitant otitis externa bilaterally the patient with Cortisporin Otic for this. Today patient states that both respiratory and ear complaints her otherwise resolve. Patient denies any increased work of breathing, cough, dyspnea on exertion. Patient also states that your complaints have resolved.      O:  Current Outpatient Prescriptions  Medication Sig Dispense Refill  . amLODipine (NORVASC) 10 MG tablet Take 1 tablet (10 mg total) by mouth daily.  90 tablet  3  . aspirin 325 MG tablet Take 1 tablet (325 mg total) by mouth daily.  30 tablet  11  . carvedilol (COREG) 12.5 MG tablet Take 1 tablet (12.5 mg total) by mouth 2 (two) times daily.  180 tablet  1  . cilostazol (PLETAL) 100 MG tablet Take 1 tablet (100 mg total) by mouth 2 (two) times daily.  180 tablet  3  . esomeprazole (NEXIUM) 40 MG capsule Take 1 capsule (40 mg total) by mouth daily.  90 capsule  1  . hydrochlorothiazide 25 MG tablet Take 1 tablet (25 mg total) by mouth daily.  90 tablet  1  . Multiple Vitamins-Minerals (MULTIVITAMIN WITH MINERALS) tablet Take 1 tablet by mouth daily.        Marland Kitchen neomycin-polymyxin-hydrocortisone (CORTISPORIN) otic solution Place 3 drops into both ears 4 (four) times daily.  10 mL  0  . pravastatin (PRAVACHOL) 40 MG tablet Take 1 tablet (40 mg total) by mouth daily.  90 tablet  3  . valsartan (DIOVAN) 320 MG tablet Take 1 tablet (320 mg total) by mouth daily.  90 tablet  1  . ZOSTAVAX 46962 UNT/0.65ML injection       . Benzoyl Peroxide (LAVOCLEN-8 CREAMY WASH) 8 % external wash use wash on back 3-4 times per week disp: QS x one month       . celecoxib  (CELEBREX) 200 MG capsule Take 200 mg by mouth daily as needed.        . doxycycline (VIBRAMYCIN) 50 MG capsule       . neomycin-bacitracin-polymyxin (NEOSPORIN) [phone] ointment Apply 1 application topically 2 (two) times daily.        . predniSONE (DELTASONE) 50 MG tablet 1 tab daily x 5 days  5 tablet  0    Wt Readings from Last 3 Encounters:  10/10/11 148 lb (67.132 kg)  10/01/11 145 lb (65.772 kg)  05/23/11 146 lb (66.225 kg)   Temp Readings from Last 3 Encounters:  10/01/11 99 F (37.2 C) Oral  05/23/11 98.4 F (36.9 C) Oral  03/22/11 98.8 F (37.1 C) Oral   BP Readings from Last 3 Encounters:  10/10/11 175/84  10/01/11 163/89  05/23/11 138/86   Pulse Readings from Last 3 Encounters:  10/10/11 87  10/01/11 89  05/23/11 88    Physical Exam:   Gen: up in chair, NAD  HEENT: NCAT, EOMI,TMs WNL bilaterally CV: RRR, no murmurs auscultated  PULM: CTAB, no wheezes, rales, rhoncii  ABD: S/NT/+ bowel sounds  EXT: 2+ peripheral pulses   A/P:

## 2011-10-12 DIAGNOSIS — H6090 Unspecified otitis externa, unspecified ear: Secondary | ICD-10-CM | POA: Insufficient documentation

## 2011-10-12 NOTE — Assessment & Plan Note (Addendum)
Elevated today. Transitioning from coreg to metoprolol for increased beta selectivity in setting of CAD, COPD and RLD. If pt cannot tolerate this, will consider transition to CCB (verapamil). Checkin

## 2011-10-12 NOTE — Assessment & Plan Note (Signed)
Exacerbation otherwise resolved. Respiratory status at baseline. Will continue prn albuterol pending formal pulmonology eval in setting of RLD.

## 2011-10-12 NOTE — Assessment & Plan Note (Signed)
Clinically resolved. Will follow prn.

## 2011-10-12 NOTE — Assessment & Plan Note (Signed)
Trying to quit. Cutting back. Encouraged cessation and congratulated pt.

## 2011-10-15 ENCOUNTER — Other Ambulatory Visit: Payer: Self-pay | Admitting: Family Medicine

## 2011-10-15 MED ORDER — ESOMEPRAZOLE MAGNESIUM 40 MG PO CPDR: 40.0000 mg | DELAYED_RELEASE_CAPSULE | Freq: Every day | ORAL | Status: DC

## 2011-10-15 MED ORDER — VALSARTAN 320 MG PO TABS: 320.0000 mg | ORAL_TABLET | Freq: Every day | ORAL | Status: DC

## 2011-10-15 MED ORDER — HYDROCHLOROTHIAZIDE 25 MG PO TABS: 25.0000 mg | ORAL_TABLET | Freq: Every day | ORAL | Status: DC

## 2011-10-22 ENCOUNTER — Institutional Professional Consult (permissible substitution): Payer: Medicare Other | Admitting: Internal Medicine

## 2011-11-12 ENCOUNTER — Encounter: Payer: Self-pay | Admitting: Internal Medicine

## 2011-11-12 ENCOUNTER — Ambulatory Visit (INDEPENDENT_AMBULATORY_CARE_PROVIDER_SITE_OTHER): Payer: Medicare Other | Admitting: Internal Medicine

## 2011-11-12 VITALS — BP 160/80 | HR 116 | Temp 98.7°F | Ht 62.0 in | Wt 150.4 lb

## 2011-11-12 DIAGNOSIS — F17200 Nicotine dependence, unspecified, uncomplicated: Secondary | ICD-10-CM

## 2011-11-12 DIAGNOSIS — R05 Cough: Secondary | ICD-10-CM

## 2011-11-12 MED ORDER — FAMOTIDINE 20 MG PO TABS: ORAL_TABLET | ORAL | Status: DC

## 2011-11-12 NOTE — Progress Notes (Signed)
  Subjective:    Patient ID: [name] Page, female    DOB: Mar 24, 1946   MRN: [id number]  HPI   33 yobf active smoker some problem with spring time rhinitis since around 2008 then cough since Aug 2012 treated as outpt for pna/ bronchitis  but never completely better so referred 11/12/2011 by Dr Agua Fria Blas to Pulmonary clinic  11/12/2011 1st pulmonary eval/ Makaley Storts cc cough daily since August 2012 indolent onset, persistent,  worse when lie down stays the same every day > clear mucus but intermittent productive esp outside in pollen.  Sinus ha's worse in pollen season - cough definitely  Better p infected upper teeth removed. Takes Nexium before bfast daily. No overt sinus or hb symptoms x sinus pressure generalized. No assoc sob/ wheeze or chest tightness.  Sleeping ok without nocturnal  or early am exacerbation  of respiratory  c/o's or need for noct saba. Also denies any obvious fluctuation of symptoms with weather or environmental changes or other aggravating or alleviating factors except as outlined above    Review of Systems  Constitutional: Negative for fever, chills and unexpected weight change.  HENT: Positive for sinus pressure. Negative for ear pain, nosebleeds, congestion, sore throat, rhinorrhea, sneezing, trouble swallowing, dental problem, voice change and postnasal drip.   Eyes: Negative for visual disturbance.  Respiratory: Positive for cough. Negative for choking and shortness of breath.   Cardiovascular: Negative for chest pain and leg swelling.  Gastrointestinal: Negative for vomiting, abdominal pain and diarrhea.  Genitourinary: Negative for difficulty urinating.  Musculoskeletal: Negative for arthralgias.  Skin: Negative for rash.  Neurological: Positive for headaches. Negative for tremors and syncope.  Hematological: Does not bruise/bleed easily.       Objective:   Physical Exam amb bf nad Wt 150 11/12/2011  HEENT: nl dentition, turbinates, and orophanx. Nl external  ear canals without cough reflex   NECK :  without JVD/Nodes/TM/ nl carotid upstrokes bilaterally   LUNGS: no acc muscle use, clear to A and P bilaterally without cough on insp or exp maneuvers   CV:  RRR  no s3 or murmur or increase in P2, no edema   ABD:  soft and nontender with nl excursion in the supine position. No bruits or organomegaly, bowel sounds nl  MS:  warm without deformities, calf tenderness, cyanosis or clubbing  SKIN: warm and dry without lesions    NEURO:  alert, approp, no deficits    cxr 10/01/11 No active lung disease.     Assessment & Plan:

## 2011-11-12 NOTE — Patient Instructions (Signed)
Please see patient coordinator before you leave today  to schedule sinus ct  Add Pepcid 20 mg one at bedtime  GERD (REFLUX)  is an extremely common cause of respiratory symptoms, many times with no significant heartburn at all.    It can be treated with medication, but also with lifestyle changes including avoidance of late meals, excessive alcohol, smoking cessation, and avoid fatty foods, chocolate, peppermint, colas, red wine, and acidic juices such as orange juice.  NO MINT OR MENTHOL PRODUCTS SO NO COUGH DROPS  USE SUGARLESS CANDY INSTEAD (jolley ranchers or Stover's)  NO OIL BASED VITAMINS - use powdered substitutes.  Please schedule a follow up office visit in 4 weeks, sooner if needed

## 2011-11-14 ENCOUNTER — Encounter: Payer: Self-pay | Admitting: Internal Medicine

## 2011-11-14 ENCOUNTER — Ambulatory Visit (INDEPENDENT_AMBULATORY_CARE_PROVIDER_SITE_OTHER)
Admission: RE | Admit: 2011-11-14 | Discharge: 2011-11-14 | Disposition: A | Discharge status: Home / Self Care | Payer: Medicare Other | Source: Ambulatory Visit | Attending: Internal Medicine | Admitting: Internal Medicine

## 2011-11-14 DIAGNOSIS — R05 Cough: Secondary | ICD-10-CM

## 2011-11-14 NOTE — Progress Notes (Signed)
Quick Note:  Spoke with pt and notified of results per Dr. Wert. Pt verbalized understanding and denied any questions.  ______ 

## 2011-11-15 ENCOUNTER — Encounter: Payer: Self-pay | Admitting: Internal Medicine

## 2011-11-15 NOTE — Assessment & Plan Note (Signed)

## 2011-11-15 NOTE — Assessment & Plan Note (Addendum)
-   Sinus CT 11/14/2011 > Unremarkable CT sinus.  The most common causes of chronic cough in immunocompetent adults include the following: upper airway cough syndrome (UACS), previously referred to as postnasal drip syndrome (PNDS), which is caused by variety of rhinosinus conditions; (2) asthma; (3) GERD; (4) chronic bronchitis from cigarette smoking or other inhaled environmental irritants; (5) nonasthmatic eosinophilic bronchitis; and (6) bronchiectasis.   These conditions, singly or in combination, have accounted for up to 94% of the causes of chronic cough in prospective studies.   Other conditions have constituted no >6% of the causes in prospective studies These have included bronchogenic carcinoma, chronic interstitial pneumonia, sarcoidosis, left ventricular failure, ACEI-induced cough, and aspiration from a condition associated with pharyngeal dysfunction.   .Chronic cough is often simultaneously caused by more than one condition. A single cause has been found from 38 to 82% of the time, multiple causes from 18 to 62%. Multiply caused cough has been the result of three diseases up to 42% of the time.    This is most likely a form of  Classic Upper airway cough syndrome, so named because it's frequently impossible to sort out how much is  CR/sinusitis with freq throat clearing (which can be related to primary GERD)   vs  causing  secondary (" extra esophageal")  GERD from wide swings in gastric pressure that occur with throat clearing, often  promoting self use of mint and menthol lozenges that reduce the lower esophageal sphincter tone and exacerbate the problem further in a cyclical fashion.   These are the same pts (now being labeled as having "irritable larynx syndrome" by some cough centers) who not infrequently have a history of having failed to tolerate ace inhibitors,  dry powder inhalers or biphosphonates or report having atypical reflux symptoms that don't respond to standard doses  of PPI , and are easily confused as having aecopd or asthma flares by even experienced allergists/ pulmonologists.  For now max rx for gerd with diet/ meds and regroup in 4 weeks but unlikely to be able to completely eliminate the cough if she keeps smoking (discussed separately)  Also discussed: The standardized cough guidelines recently published in Chest by [place] in 2006  are a multiple step process (up to 12!) , not a single office visit,  and are intended  to address this problem logically,  with an alogrithm dependent on response to empiric treatment at  each progressive step  to determine a specific diagnosis with  minimal addtional testing needed. Therefore if compliance is an issue or can't be accurately verified then it's very unlikely the standard evaluation and treatment will be successful here.    Furthermore, response to therapy (other than acute cough suppression, which should only be used short term with avoidance of narcotic containing cough syrups if possible), can be a gradual process for which the patient may not receive immediate benefit.  Unlike going to an eye doctor where the right rx is almost always the first one and is immediately effective, this is almost never the case in the management of chronic cough syndromes and the patient needs to commit up front to compliance with recommendations and have the patience to wait out a response for up to 6 weeks of therapy directed at the likely underlying problem(s).

## 2011-11-26 ENCOUNTER — Encounter: Payer: Self-pay | Admitting: Family Medicine

## 2011-11-26 ENCOUNTER — Ambulatory Visit (INDEPENDENT_AMBULATORY_CARE_PROVIDER_SITE_OTHER): Payer: Medicare Other | Admitting: Family Medicine

## 2011-11-26 VITALS — BP 131/78 | HR 114 | Ht 62.0 in | Wt 149.0 lb

## 2011-11-26 DIAGNOSIS — I10 Essential (primary) hypertension: Secondary | ICD-10-CM

## 2011-11-26 MED ORDER — DILTIAZEM HCL ER 120 MG PO CP12: 120.0000 mg | ORAL_CAPSULE | Freq: Two times a day (BID) | ORAL | Status: DC

## 2011-11-26 NOTE — Patient Instructions (Signed)
He was good to see today  I'm changing some your blood pressure medicines Stop the Norvasc Start the Diltiazem Come back to see me in 2 weeks or call me with your blood pressures at home Otherwise followup in 2-3 months.

## 2011-11-26 NOTE — Progress Notes (Signed)
Subjective:  Pt presents today for general follow up visit. No acute issues.  HTN:  Checking BPS Daily ?:yes BP Range:130s-140s Any HA, CP, SOB?:no Any Medication Side Effects:no Medication Compliance:yes Salt intake?:does use some salt with foods  Exercise?:no  Lab Results  Component Value Date   LDLCALC 58 07/02/2011            Review of Systems - Negative except as noted above in HPI  Objective:  Current Outpatient Prescriptions  Medication Sig Dispense Refill  . amLODipine (NORVASC) 10 MG tablet Take 1 tablet (10 mg total) by mouth daily.  90 tablet  3  . aspirin 325 MG tablet Take 1 tablet (325 mg total) by mouth daily.  30 tablet  11  . Benzoyl Peroxide (LAVOCLEN-8 CREAMY WASH) 8 % external wash use wash on back 3-4 times per week disp: QS x one month       . cilostazol (PLETAL) 100 MG tablet Take 1 tablet (100 mg total) by mouth 2 (two) times daily.  180 tablet  3  . esomeprazole (NEXIUM) 40 MG capsule Take 1 capsule (40 mg total) by mouth daily.  90 capsule  3  . famotidine (PEPCID) 20 MG tablet One at bedtime  30 tablet  11  . hydrochlorothiazide (HYDRODIURIL) 25 MG tablet Take 1 tablet (25 mg total) by mouth daily.  90 tablet  1  . metoprolol succinate (TOPROL-XL) 25 MG 24 hr tablet Take 1 tablet (25 mg total) by mouth daily.  90 tablet  3  . Multiple Vitamins-Minerals (MULTIVITAMIN WITH MINERALS) tablet Take 1 tablet by mouth daily.        Marland Kitchen neomycin-bacitracin-polymyxin (NEOSPORIN) [phone] ointment Apply 1 application topically 2 (two) times daily.        . pravastatin (PRAVACHOL) 40 MG tablet Take 1 tablet (40 mg total) by mouth daily.  90 tablet  3  . valsartan (DIOVAN) 320 MG tablet Take 1 tablet (320 mg total) by mouth daily.  90 tablet  1    Wt Readings from Last 3 Encounters:  11/26/11 149 lb (67.586 kg)  11/12/11 150 lb 6.4 oz (68.221 kg)  10/10/11 148 lb (67.132 kg)   Temp Readings from Last 3 Encounters:  11/12/11 98.7 F (37.1 C) Oral    10/01/11 99 F (37.2 C) Oral  05/23/11 98.4 F (36.9 C) Oral   BP Readings from Last 3 Encounters:  11/26/11 131/78  11/12/11 160/80  10/10/11 175/84   Pulse Readings from Last 3 Encounters:  11/26/11 114  11/12/11 116  10/10/11 87   General: alert and cooperative HEENT: PERRLA and extra ocular movement intact Heart: S1, S2 normal, no murmur, rub or gallop, regular rate and rhythm Lungs: clear to auscultation, no wheezes or rales and unlabored breathing Abdomen: abdomen is soft without significant tenderness, masses, organomegaly or guarding Extremities: extremities normal, atraumatic, no cyanosis or edema Skin:no rashes Neurology: normal without focal findings   Assessment/Plan:

## 2011-11-26 NOTE — Assessment & Plan Note (Addendum)
BP at goal. Given persistent tachycardia, will transition from norvasc to nodal CCB diltiazem (avoiding beta blocker given baseline obstructive lung disease). Follow up in 2-4 weeks. Recheck Cr and K at that time.

## 2011-12-10 ENCOUNTER — Ambulatory Visit (HOSPITAL_COMMUNITY)
Admission: RE | Admit: 2011-12-10 | Discharge: 2011-12-10 | Disposition: A | Discharge status: Home / Self Care | Payer: Medicare Other | Source: Ambulatory Visit | Attending: Family Medicine | Admitting: Family Medicine

## 2011-12-10 ENCOUNTER — Ambulatory Visit (INDEPENDENT_AMBULATORY_CARE_PROVIDER_SITE_OTHER): Payer: Medicare Other | Admitting: Family Medicine

## 2011-12-10 ENCOUNTER — Encounter: Payer: Self-pay | Admitting: Family Medicine

## 2011-12-10 VITALS — BP 137/86 | HR 120 | Temp 98.3°F | Ht 62.0 in | Wt 150.4 lb

## 2011-12-10 DIAGNOSIS — I498 Other specified cardiac arrhythmias: Secondary | ICD-10-CM | POA: Insufficient documentation

## 2011-12-10 DIAGNOSIS — I10 Essential (primary) hypertension: Secondary | ICD-10-CM

## 2011-12-10 DIAGNOSIS — R000 Tachycardia, unspecified: Secondary | ICD-10-CM | POA: Insufficient documentation

## 2011-12-10 MED ORDER — DILTIAZEM HCL ER 90 MG PO CP12: 180.0000 mg | ORAL_CAPSULE | Freq: Two times a day (BID) | ORAL | Status: DC

## 2011-12-10 NOTE — Assessment & Plan Note (Signed)
Increasing diltiazem today.

## 2011-12-10 NOTE — Patient Instructions (Signed)
It was good to see you today I'm increasing your diltiazem to 180 mg twice a day I would like for you to come back in for a vital sign recheck on Wednesday Be sure to check her blood pressures and heart rates on a regular basis Come back for followup visit next week to discuss your heart rate and blood pressure Call if any questions

## 2011-12-10 NOTE — Progress Notes (Signed)
Subjective:   Hypertension:  Patient recently transitioned from metoprolol to diltiazem in the setting of hypertension, tachycardia with underlying COPD. Patient presents today for followup of hypertension. Checking BPS Daily ?:3-4 times per week BP Range:130s-15-s  Any HA, CP, SOB?:no Any Medication Side Effects:no Medication Compliance:yes       Review of Systems - Negative except as noted above in HPI.   Objective:  Current Outpatient Prescriptions  Medication Sig Dispense Refill  . aspirin 325 MG tablet Take 1 tablet (325 mg total) by mouth daily.  30 tablet  11  . Benzoyl Peroxide (LAVOCLEN-8 CREAMY WASH) 8 % external wash use wash on back 3-4 times per week disp: QS x one month       . cilostazol (PLETAL) 100 MG tablet Take 1 tablet (100 mg total) by mouth 2 (two) times daily.  180 tablet  3  . diltiazem (CARDIZEM SR) 90 MG 12 hr capsule Take 2 capsules (180 mg total) by mouth 2 (two) times daily.  120 capsule  6  . esomeprazole (NEXIUM) 40 MG capsule Take 1 capsule (40 mg total) by mouth daily.  90 capsule  3  . famotidine (PEPCID) 20 MG tablet One at bedtime  30 tablet  11  . hydrochlorothiazide (HYDRODIURIL) 25 MG tablet Take 1 tablet (25 mg total) by mouth daily.  90 tablet  1  . Multiple Vitamins-Minerals (MULTIVITAMIN WITH MINERALS) tablet Take 1 tablet by mouth daily.        Marland Kitchen neomycin-bacitracin-polymyxin (NEOSPORIN) [phone] ointment Apply 1 application topically 2 (two) times daily.        . pravastatin (PRAVACHOL) 40 MG tablet Take 1 tablet (40 mg total) by mouth daily.  90 tablet  3  . valsartan (DIOVAN) 320 MG tablet Take 1 tablet (320 mg total) by mouth daily.  90 tablet  1  . DISCONTD: diltiazem (CARDIZEM SR) 120 MG 12 hr capsule Take 1 capsule (120 mg total) by mouth 2 (two) times daily.  60 capsule  6    Wt Readings from Last 3 Encounters:  12/10/11 150 lb 7 oz (68.238 kg)  11/26/11 149 lb (67.586 kg)  11/12/11 150 lb 6.4 oz (68.221 kg)   Temp Readings  from Last 3 Encounters:  12/10/11 98.3 F (36.8 C) Oral  11/12/11 98.7 F (37.1 C) Oral  10/01/11 99 F (37.2 C) Oral   BP Readings from Last 3 Encounters:  12/10/11 149/88  11/26/11 131/78  11/12/11 160/80   Pulse Readings from Last 3 Encounters:  12/10/11 123  11/26/11 114  11/12/11 116     General: alert and cooperative HEENT: PERRLA and extra ocular movement intact Heart: tachycardic, no murmurs Lungs: good overall air movement, no wheezes  Abdomen: abdomen is soft without significant tenderness, masses, organomegaly or guarding Extremities: extremities normal, atraumatic, no cyanosis or edema Skin:no rashes Neurology: normal without focal findings  EKG: HR 112, sinus tachycardia, no axis deviation, no St or T wave abnormalities.   Assessment/Plan:

## 2011-12-10 NOTE — Assessment & Plan Note (Signed)
This has been a recurrent issue. Suspect this is secondary to underlying pulmonary disease. No signs of heart failure currently (orthopnea, DOE, LE swelling). CXR 09/2011 w/o cardiomegaly. Plan to increase diltiazem to 180mg  BID. WIll follow up BP and HR in 2 days. Clinic follow up in 1 week. Next step would be addition back of metoprolol. Will also check TSH.

## 2011-12-11 ENCOUNTER — Ambulatory Visit (INDEPENDENT_AMBULATORY_CARE_PROVIDER_SITE_OTHER): Payer: Medicare Other | Admitting: Internal Medicine

## 2011-12-11 ENCOUNTER — Encounter: Payer: Self-pay | Admitting: Internal Medicine

## 2011-12-11 VITALS — BP 142/78 | HR 114 | Temp 98.2°F | Ht 62.0 in | Wt 151.6 lb

## 2011-12-11 DIAGNOSIS — R059 Cough, unspecified: Secondary | ICD-10-CM

## 2011-12-11 DIAGNOSIS — R05 Cough: Secondary | ICD-10-CM

## 2011-12-11 DIAGNOSIS — J449 Chronic obstructive pulmonary disease, unspecified: Secondary | ICD-10-CM

## 2011-12-11 NOTE — Progress Notes (Signed)
  Subjective:    Patient ID: [name] Page, female    DOB: 11/27/1945   MRN: [id number]  HPI   52 yobf active smoker with GOLD O copd 10/15/11  some problem with spring time rhinitis since around 2008 then cough since Aug 2012 treated as outpt for pna/ bronchitis  but never completely better so referred 11/12/2011 by Dr St. Erroll Wilbourne Blas to Pulmonary clinic.  11/12/2011 1st pulmonary eval/ Merriam Brandner cc cough daily since August 2012 indolent onset, persistent,  worse when lie down stays the same every day > clear mucus but intermittent productive esp outside in pollen.  Sinus ha's worse in pollen season - cough definitely  Better p infected upper teeth removed. Takes Nexium before bfast daily. No overt sinus or hb symptoms x sinus pressure generalized. No assoc sob/ wheeze or chest tightness. rec schedule sinus ct > neg Add Pepcid 20 mg one at bedtime GERD diet  12/11/2011 f/u ov/Jaben Benegas cc cough all gone, no sob or need for saba daytime   Sleeping ok without nocturnal  or early am exacerbation  of respiratory  c/o's or need for noct saba. Also denies any obvious fluctuation of symptoms with weather or environmental changes or other aggravating or alleviating factors except as outlined above          Objective:   Physical Exam amb bf nad  Wt 150 11/12/2011  > 151 12/11/2011   HEENT: nl dentition, turbinates, and orophanx. Nl external ear canals without cough reflex   NECK :  without JVD/Nodes/TM/ nl carotid upstrokes bilaterally   LUNGS: no acc muscle use, clear to A and P bilaterally without cough on insp or exp maneuvers   CV:  RRR  no s3 or murmur or increase in P2, no edema   ABD:  soft and nontender with nl excursion in the supine position. No bruits or organomegaly, bowel sounds nl  MS:  warm without deformities, calf tenderness, cyanosis or clubbing  SKIN: warm and dry without lesions    NEURO:  alert, approp, no deficits    cxr 10/01/11 No active lung disease.     Assessment &  Plan:

## 2011-12-11 NOTE — Assessment & Plan Note (Signed)
Cough has resolved typical of  Classic Upper airway cough syndrome, so named because it's frequently impossible to sort out how much is  CR/sinusitis with freq throat clearing (which can be related to primary GERD)   vs  causing  secondary (" extra esophageal")  GERD from wide swings in gastric pressure that occur with throat clearing, often  promoting self use of mint and menthol lozenges that reduce the lower esophageal sphincter tone and exacerbate the problem further in a cyclical fashion.   These are the same pts (now being labeled as having "irritable larynx syndrome" by some cough centers) who not infrequently have a history of having failed to tolerate ace inhibitors,  dry powder inhalers or biphosphonates or report having atypical reflux symptoms that don't respond to standard doses of PPI , and are easily confused as having aecopd or asthma flares by even experienced allergists/ pulmonologists.   Pulmonary f/u is as needed

## 2011-12-11 NOTE — Patient Instructions (Signed)
No change in treatment plan  The key is to stop smoking completely before smoking completely stops you!

## 2011-12-11 NOTE — Assessment & Plan Note (Signed)
-   GOLD 0 10/15/11 PFT's nl   I reviewed the Flethcher curve with patient that basically indicates  if you quit smoking when your best day FEV1 is still well preserved(which hers is) it is highly unlikely you will progress to severe disease and informed the patient there was no medication on the market that has proven to change the curve or the likelihood of progression.  Therefore stopping smoking and maintaining abstinence is the most important aspect of care, not choice of inhalers or for that matter, doctors.    No further f/u needed

## 2011-12-12 ENCOUNTER — Ambulatory Visit (INDEPENDENT_AMBULATORY_CARE_PROVIDER_SITE_OTHER): Payer: Medicare Other | Admitting: *Deleted

## 2011-12-12 VITALS — BP 134/80 | HR 88

## 2011-12-12 DIAGNOSIS — I10 Essential (primary) hypertension: Secondary | ICD-10-CM

## 2011-12-12 NOTE — Progress Notes (Signed)
Patient in for BP  and heart rate check as directed by Dr. Alvester Morin. BP checked  manually with regular adult cuff  LA 124/76 and RA 134/80. Pulse 88. She  Is taking medication as directed.  Advised to return to see Dr. Alvester Morin in one week.

## 2011-12-19 ENCOUNTER — Ambulatory Visit (INDEPENDENT_AMBULATORY_CARE_PROVIDER_SITE_OTHER): Payer: Medicare Other | Admitting: Family Medicine

## 2011-12-19 ENCOUNTER — Encounter: Payer: Self-pay | Admitting: Family Medicine

## 2011-12-19 VITALS — BP 138/88 | HR 101 | Ht 62.0 in | Wt 149.0 lb

## 2011-12-19 DIAGNOSIS — I498 Other specified cardiac arrhythmias: Secondary | ICD-10-CM

## 2011-12-19 DIAGNOSIS — R000 Tachycardia, unspecified: Secondary | ICD-10-CM

## 2011-12-19 NOTE — Progress Notes (Signed)
Subjective:  Patient presents today for tachycardia followup Patient was seen approximately one week ago with noted persistent tachycardia. EKG obtained showing sinus tachycardia. Patient previously on diltiazem 120 twice a day. (Beta blocker deferred in setting of COPD) This was increased to 180 mg twice a day. Today, patient states she's been confused and was taken to 120 mg daily instead. No chest pain, shortness of breath, dyspnea on exertion, lower extremity swelling.      Review of Systems - Negative except as noted above in HPI   Objective:  Current Outpatient Prescriptions  Medication Sig Dispense Refill  . aspirin 325 MG tablet Take 1 tablet (325 mg total) by mouth daily.  30 tablet  11  . Benzoyl Peroxide (LAVOCLEN-8 CREAMY WASH) 8 % external wash use wash on back 3-4 times per week disp: QS x one month       . cilostazol (PLETAL) 100 MG tablet Take 1 tablet (100 mg total) by mouth 2 (two) times daily.  180 tablet  3  . diltiazem (CARDIZEM SR) 90 MG 12 hr capsule Take 2 capsules (180 mg total) by mouth 2 (two) times daily.  120 capsule  6  . esomeprazole (NEXIUM) 40 MG capsule Take 1 capsule (40 mg total) by mouth daily.  90 capsule  3  . famotidine (PEPCID) 20 MG tablet One at bedtime  30 tablet  11  . hydrochlorothiazide (HYDRODIURIL) 25 MG tablet Take 1 tablet (25 mg total) by mouth daily.  90 tablet  1  . Multiple Vitamins-Minerals (MULTIVITAMIN WITH MINERALS) tablet Take 1 tablet by mouth daily.        Marland Kitchen neomycin-bacitracin-polymyxin (NEOSPORIN) [phone] ointment Apply 1 application topically 2 (two) times daily.        . pravastatin (PRAVACHOL) 40 MG tablet Take 1 tablet (40 mg total) by mouth daily.  90 tablet  3  . valsartan (DIOVAN) 320 MG tablet Take 1 tablet (320 mg total) by mouth daily.  90 tablet  1    Wt Readings from Last 3 Encounters:  12/19/11 149 lb (67.586 kg)  12/11/11 151 lb 9.6 oz (68.765 kg)  12/10/11 150 lb 7 oz (68.238 kg)   Temp Readings from  Last 3 Encounters:  12/11/11 98.2 F (36.8 C) Oral  12/10/11 98.3 F (36.8 C) Oral  11/12/11 98.7 F (37.1 C) Oral   BP Readings from Last 3 Encounters:  12/19/11 138/88  12/12/11 134/80  12/11/11 142/78   Pulse Readings from Last 3 Encounters:  12/19/11 101  12/12/11 88  12/11/11 114     General: alert and cooperative HEENT: PERRLA and extra ocular movement intact Heart: tachycardic, no murmurs  Lungs: clear to auscultation, no wheezes or rales and unlabored breathing Abdomen: abdomen is soft without significant tenderness, masses, organomegaly or guarding Extremities: extremities normal, atraumatic, no cyanosis or edema Skin:no rashes Neurology: normal without focal findings   Assessment/Plan:

## 2011-12-19 NOTE — Assessment & Plan Note (Addendum)
Discussed compliance with 180mg  BID dosing. Pt ok with VS recheck in 48 hours. CV red flags discussed. Follow up in 2-4 weeks or sooner if needed.

## 2011-12-19 NOTE — Patient Instructions (Signed)
It was good to see today Be sure to take her diltiazem 180 mg twice a day Come back in on May 31 for a blood pressure and vital sign check Call if any questions

## 2011-12-21 ENCOUNTER — Ambulatory Visit (INDEPENDENT_AMBULATORY_CARE_PROVIDER_SITE_OTHER): Payer: Medicare Other | Admitting: *Deleted

## 2011-12-21 VITALS — BP 146/86 | HR 100

## 2011-12-21 DIAGNOSIS — I10 Essential (primary) hypertension: Secondary | ICD-10-CM

## 2011-12-21 NOTE — Progress Notes (Signed)
Patient in office  for BP check as directed by Dr. Alvester Morin. BP checked manually using regular adult cuff. BP LA 150/86 and RA 146/86  pulse 100. Dr. Alvester Morin came in to speak to patient and it is decided  for her to continue on current medication doses and recheck BP and pulse in one week.

## 2011-12-28 ENCOUNTER — Other Ambulatory Visit: Payer: Self-pay | Admitting: Family Medicine

## 2011-12-28 ENCOUNTER — Ambulatory Visit (INDEPENDENT_AMBULATORY_CARE_PROVIDER_SITE_OTHER): Payer: Medicare Other | Admitting: *Deleted

## 2011-12-28 VITALS — BP 150/90 | HR 88

## 2011-12-28 DIAGNOSIS — I10 Essential (primary) hypertension: Secondary | ICD-10-CM

## 2011-12-28 MED ORDER — METOPROLOL SUCCINATE ER 50 MG PO TB24: 50.0000 mg | ORAL_TABLET | Freq: Every day | ORAL | Status: DC

## 2011-12-28 NOTE — Progress Notes (Signed)
Patient in office again for BP check . BP checked manually using regular adult cuff.  BP LA 150/90 and RA 160-90 pulse 88. Patient did not bring meds with her .  She is not able to tell me exactly what she is taking. She will go home now and I will call her to verify meds she is taking.

## 2011-12-31 NOTE — Progress Notes (Signed)
Late entry.    patient called back  Friday 06/08 and we went over all her meds. She is taking  Metoprolol succ 25 mg daily. This was not on her current med list . All other meds are correct. Paged Dr. Alvester Morin and he advises for patient to increase metoprolol to 50 mg daily. Follow up BP in one week.  Patient notified.

## 2012-01-04 ENCOUNTER — Ambulatory Visit (INDEPENDENT_AMBULATORY_CARE_PROVIDER_SITE_OTHER): Payer: Medicare Other | Admitting: *Deleted

## 2012-01-04 DIAGNOSIS — I10 Essential (primary) hypertension: Secondary | ICD-10-CM

## 2012-01-04 NOTE — Progress Notes (Signed)
Patient in for BP check.  BP checked manually using regular adult cuff.  BP   LA 144/88 and RA 160/90 pulse 96. She did not bring meds with her today.  Appointment scheduled with Dr. Alvester Morin 06/24. Advised patient to bring all meds to that appointment .  Dr. Alvester Morin notified.

## 2012-01-08 ENCOUNTER — Telehealth: Payer: Self-pay | Admitting: Family Medicine

## 2012-01-08 NOTE — Telephone Encounter (Signed)
Pt states that the diovan is too expensive and needs to have something cheaper  OP Pharm

## 2012-01-08 NOTE — Telephone Encounter (Signed)
Will forward message to Dr. Denyse Amass in Dr. Barwick Blas absence.

## 2012-01-10 ENCOUNTER — Other Ambulatory Visit: Payer: Self-pay | Admitting: Family Medicine

## 2012-01-10 MED ORDER — LOSARTAN POTASSIUM 50 MG PO TABS: 75.0000 mg | ORAL_TABLET | Freq: Every day | ORAL | Status: DC

## 2012-01-10 NOTE — Telephone Encounter (Signed)
Route to [facility]

## 2012-01-10 NOTE — Telephone Encounter (Signed)
Cozaar called in in place of diovan.  Diovan formally discontinued.  Please contact pt.  Thank you.

## 2012-01-10 NOTE — Telephone Encounter (Signed)
Patient notified

## 2012-01-14 ENCOUNTER — Encounter: Payer: Self-pay | Admitting: Family Medicine

## 2012-01-14 ENCOUNTER — Ambulatory Visit (INDEPENDENT_AMBULATORY_CARE_PROVIDER_SITE_OTHER): Payer: Medicare Other | Admitting: Family Medicine

## 2012-01-14 VITALS — BP 146/84 | HR 90 | Ht 62.0 in | Wt 149.0 lb

## 2012-01-14 DIAGNOSIS — R000 Tachycardia, unspecified: Secondary | ICD-10-CM

## 2012-01-14 DIAGNOSIS — I498 Other specified cardiac arrhythmias: Secondary | ICD-10-CM

## 2012-01-14 DIAGNOSIS — I10 Essential (primary) hypertension: Secondary | ICD-10-CM

## 2012-01-14 NOTE — Progress Notes (Signed)
Subjective:   Pt presents today for HTN follow up.  Pt also with baseline hx/o sinus tachycardia Is on metoprolol and diltiazem for this currently   HTN:  Checking BPS Daily ?:intermittently at pharmacy  BP Range:140s Any HA, CP, SOB?:no Any Medication Side Effects:no Medication Compliance:yes Salt intake?:stable Exercise?:minimal Weight Loss?:no        Review of Systems - Negative except as noted above in HPI.  Objective:  Current Outpatient Prescriptions  Medication Sig Dispense Refill  . aspirin 325 MG tablet Take 1 tablet (325 mg total) by mouth daily.  30 tablet  11  . Benzoyl Peroxide (LAVOCLEN-8 CREAMY WASH) 8 % external wash use wash on back 3-4 times per week disp: QS x one month       . cilostazol (PLETAL) 100 MG tablet Take 1 tablet (100 mg total) by mouth 2 (two) times daily.  180 tablet  3  . diltiazem (CARDIZEM SR) 90 MG 12 hr capsule Take 2 capsules (180 mg total) by mouth 2 (two) times daily.  120 capsule  6  . esomeprazole (NEXIUM) 40 MG capsule Take 1 capsule (40 mg total) by mouth daily.  90 capsule  3  . hydrochlorothiazide (HYDRODIURIL) 25 MG tablet Take 1 tablet (25 mg total) by mouth daily.  90 tablet  1  . losartan (COZAAR) 50 MG tablet Take 1.5 tablets (75 mg total) by mouth daily.  60 tablet  6  . metoprolol succinate (TOPROL-XL) 50 MG 24 hr tablet Take 1 tablet (50 mg total) by mouth daily.  90 tablet  3  . Multiple Vitamins-Minerals (MULTIVITAMIN WITH MINERALS) tablet Take 1 tablet by mouth daily.        Marland Kitchen neomycin-bacitracin-polymyxin (NEOSPORIN) [phone] ointment Apply 1 application topically 2 (two) times daily.        . pravastatin (PRAVACHOL) 40 MG tablet Take 1 tablet (40 mg total) by mouth daily.  90 tablet  3  . famotidine (PEPCID) 20 MG tablet One at bedtime  30 tablet  11    Wt Readings from Last 3 Encounters:  01/14/12 149 lb (67.586 kg)  12/19/11 149 lb (67.586 kg)  12/11/11 151 lb 9.6 oz (68.765 kg)   Temp Readings from Last 3  Encounters:  12/11/11 98.2 F (36.8 C) Oral  12/10/11 98.3 F (36.8 C) Oral  11/12/11 98.7 F (37.1 C) Oral   BP Readings from Last 3 Encounters:  01/14/12 146/84  12/31/11 150/90  12/21/11 146/86   Pulse Readings from Last 3 Encounters:  01/14/12 90  12/31/11 88  12/21/11 100     General: alert and cooperative HEENT: PERRLA and extra ocular movement intact Heart: S1, S2 normal, no murmur, rub or gallop, regular rate and rhythm Lungs: clear to auscultation, no wheezes or rales and unlabored breathing Abdomen: abdomen is soft without significant tenderness, masses, organomegaly or guarding Extremities: extremities normal, atraumatic, no cyanosis or edema Skin:no rashes Neurology: normal without focal findings   Assessment/Plan:

## 2012-01-14 NOTE — Assessment & Plan Note (Signed)
Clinically improved with metoprolol and diltiazem.  Will continue with current regimen.  Next step would be increase in metoprolol.

## 2012-01-14 NOTE — Patient Instructions (Signed)
It was good to see you today  Continue with your current medicines Be sure to get your colonoscopy done.  Come back for a follow up appointment in 6-12 weeks. Call if any questions,  God Bless, Doree Albee MD

## 2012-01-14 NOTE — Assessment & Plan Note (Signed)
Near goal.  Pt would like to continue with this regimen. Plan to follow up in 2-3 months.  CV red flags reviewed.

## 2012-03-19 ENCOUNTER — Other Ambulatory Visit: Payer: Self-pay | Admitting: Family Medicine

## 2012-03-19 DIAGNOSIS — I10 Essential (primary) hypertension: Secondary | ICD-10-CM

## 2012-03-21 ENCOUNTER — Other Ambulatory Visit: Payer: Self-pay | Admitting: *Deleted

## 2012-03-21 DIAGNOSIS — I10 Essential (primary) hypertension: Secondary | ICD-10-CM

## 2012-03-21 MED ORDER — DILTIAZEM HCL ER 90 MG PO CP12: 180.0000 mg | ORAL_CAPSULE | Freq: Two times a day (BID) | ORAL | Status: DC

## 2012-03-21 NOTE — Telephone Encounter (Signed)
The 360 1 time per day would be perfect

## 2012-03-28 ENCOUNTER — Other Ambulatory Visit: Payer: Self-pay | Admitting: Family Medicine

## 2012-03-28 MED ORDER — DILTIAZEM HCL ER BEADS 360 MG PO CP24: 360.0000 mg | ORAL_CAPSULE | Freq: Every day | ORAL | Status: DC

## 2012-04-09 ENCOUNTER — Other Ambulatory Visit: Payer: Self-pay | Admitting: *Deleted

## 2012-04-09 ENCOUNTER — Other Ambulatory Visit: Payer: Self-pay | Admitting: Family Medicine

## 2012-04-09 MED ORDER — HYDROCHLOROTHIAZIDE 25 MG PO TABS: 25.0000 mg | ORAL_TABLET | Freq: Every day | ORAL | Status: DC

## 2012-04-10 ENCOUNTER — Telehealth: Payer: Self-pay | Admitting: Family Medicine

## 2012-04-10 MED ORDER — NITROGLYCERIN 0.4 MG SL SUBL: 0.4000 mg | SUBLINGUAL_TABLET | SUBLINGUAL | Status: DC | PRN

## 2012-04-10 NOTE — Telephone Encounter (Signed)
Pt requesting nitro which we have never filled so I calle dto make sure she wasn't having current chest pain. She had 1 episode of chest pain yesterday, and attributes it to something she ate. No assocuiated nausea, vomiting, lightheadness, or sweating. It's the first time she has had it in  Long time and was previously prescribed nnitro by her cardiologist (Dr. Clarene Duke), who told her to keep it on hand. Said she took it and it was old and crumbly so just wanted to keep it on hand. Last saw cardiologist 1 year ago and scheduled to see him in novemeber.  I reviewed red flags with her and she verbalized understanding.   Filled nitro with no refills

## 2012-07-10 ENCOUNTER — Other Ambulatory Visit: Payer: Self-pay | Admitting: Family Medicine

## 2012-07-11 ENCOUNTER — Other Ambulatory Visit: Payer: Self-pay | Admitting: Family Medicine

## 2012-07-14 ENCOUNTER — Telehealth: Payer: Self-pay | Admitting: Family Medicine

## 2012-07-14 MED ORDER — PANTOPRAZOLE SODIUM 20 MG PO TBEC: 20.0000 mg | DELAYED_RELEASE_TABLET | Freq: Every day | ORAL | Status: DC

## 2012-07-14 NOTE — Telephone Encounter (Signed)
I am covering Dr. Felipa Emory inbox while he is on vacation. Received a faxed refill request for pt's Nexium, asking to provide a different PPI as her copay has gone up tremendously.   From chart review, patient has not been seen in our clinic since June of this year. She needs to schedule a follow up appointment to meet her new PCP. Will route message to [facility] red team asking them to call patient to instruct her to schedule an appointment with Dr. Ermalinda Memos.  Will rx one month's worth of protonix 20mg , as this is generic. Will not provide any refills. Patient should be seen before further refills are given.

## 2012-07-17 NOTE — Telephone Encounter (Signed)
Attempted to call patient, no voicemail. Please see message below.[name], [name] Medin

## 2012-07-28 ENCOUNTER — Other Ambulatory Visit: Payer: Self-pay | Admitting: Family Medicine

## 2012-07-28 NOTE — Telephone Encounter (Signed)
Patient has not been seen since June of 2012. I will refill this now but will need to see he rfor furthe refills.  Please call to make appointment, Thanks.

## 2012-07-29 ENCOUNTER — Other Ambulatory Visit: Payer: Self-pay | Admitting: *Deleted

## 2012-07-30 MED ORDER — PRAVASTATIN SODIUM 40 MG PO TABS: 40.0000 mg | ORAL_TABLET | Freq: Every day | ORAL | Status: DC

## 2012-08-06 ENCOUNTER — Encounter: Payer: Self-pay | Admitting: Family Medicine

## 2012-08-06 ENCOUNTER — Ambulatory Visit (INDEPENDENT_AMBULATORY_CARE_PROVIDER_SITE_OTHER): Payer: Medicare Other | Admitting: Family Medicine

## 2012-08-06 VITALS — BP 156/93 | HR 96 | Ht 62.0 in | Wt 146.0 lb

## 2012-08-06 DIAGNOSIS — K219 Gastro-esophageal reflux disease without esophagitis: Secondary | ICD-10-CM

## 2012-08-06 DIAGNOSIS — F17200 Nicotine dependence, unspecified, uncomplicated: Secondary | ICD-10-CM

## 2012-08-06 DIAGNOSIS — I10 Essential (primary) hypertension: Secondary | ICD-10-CM

## 2012-08-06 DIAGNOSIS — I2510 Atherosclerotic heart disease of native coronary artery without angina pectoris: Secondary | ICD-10-CM

## 2012-08-06 DIAGNOSIS — K2100 Gastro-esophageal reflux disease with esophagitis, without bleeding: Secondary | ICD-10-CM

## 2012-08-06 MED ORDER — PANTOPRAZOLE SODIUM 40 MG PO TBEC: 40.0000 mg | DELAYED_RELEASE_TABLET | Freq: Every day | ORAL | Status: DC

## 2012-08-06 MED ORDER — LOSARTAN POTASSIUM 50 MG PO TABS: 75.0000 mg | ORAL_TABLET | Freq: Every day | ORAL | Status: DC

## 2012-08-06 MED ORDER — METOPROLOL SUCCINATE ER 50 MG PO TB24: 50.0000 mg | ORAL_TABLET | Freq: Every day | ORAL | Status: DC

## 2012-08-06 MED ORDER — PANTOPRAZOLE SODIUM 20 MG PO TBEC: 40.0000 mg | DELAYED_RELEASE_TABLET | Freq: Every day | ORAL | Status: DC

## 2012-08-06 MED ORDER — FAMOTIDINE 20 MG PO TABS: ORAL_TABLET | ORAL | Status: DC

## 2012-08-06 MED ORDER — DILTIAZEM HCL ER BEADS 360 MG PO CP24: 360.0000 mg | ORAL_CAPSULE | Freq: Every day | ORAL | Status: DC

## 2012-08-06 MED ORDER — CILOSTAZOL 100 MG PO TABS: 100.0000 mg | ORAL_TABLET | Freq: Two times a day (BID) | ORAL | Status: DC

## 2012-08-06 MED ORDER — HYDROCHLOROTHIAZIDE 25 MG PO TABS: 25.0000 mg | ORAL_TABLET | Freq: Every day | ORAL | Status: DC

## 2012-08-06 NOTE — Assessment & Plan Note (Signed)
BP 156/93 today, states she is recently stressed and missing diltiazem for a few days No red flags  CMP Continue Metoprolol, HCTZ, dilt, Add amlodipine vs increase BB if needed on next visit.

## 2012-08-06 NOTE — Patient Instructions (Signed)
Thanks for coming in today, it was great to meet you!  I have sent you all of your meds for refill I doubled the doe of the protonix, if it doesn't help, let me know.   I like how much you are cutting back on the cigarettes, let me know if I can help!  Please come back to see me in 2 to 3 months for follow up.

## 2012-08-06 NOTE — Assessment & Plan Note (Signed)
GERD for years, previously well controlled and exacerbated after med change  Increase protonix to 40 daily, continue prilosec as it seems to be severe for her

## 2012-08-06 NOTE — Assessment & Plan Note (Addendum)
MI in the 80's with stents, a repeat with angioplasty No chest pain currently On ASA, BB, statin, and ARB, compliant with HTN meds Counseled on smoking cessation Fasting Lipid, CMP, CBC

## 2012-08-06 NOTE — Assessment & Plan Note (Signed)
Cutting back significantly, wanting to quit Offered support, will offer nicotine supplements and/or meeting with Paulino Rily PharmD in clinic for counseling next visit.

## 2012-08-06 NOTE — Progress Notes (Signed)
  Subjective:    Patient ID: [name] Page, female    DOB: 1945-09-12, 67 y.o.   MRN: [id number]  HPI Here for med refill and HTN f/u HTN:  Taking meds except dilt, as was just refilled.  No HA, CP, palpitations Seems stress exacerbated  GERD For approx 12 years, well controlled with Nexium and prilosec Heartburn worsened with ceasrtain foods, alleviated with water or apple juice Worsened by lying down Recent change to ins made nexium too expensive, protonix 20 mg nit helping as well and having pain daily after 1 or 2 pm   Med refill: meds reconciled in EMR, taking all meds but wanting new refills at different pahrmacy   Down to 2-4 cigg daily, recent increased a few b/cof stress, now back down Trying to stop  Review of Systems N fever chills sweats No chest pain, palpitatons No dyspnea, cough No N/v/d No dysuria Occasional leg/thigh pain with walking- helped by pletal Mood good- no depression/anxiety    Objective:   Physical Exam  Gen: NAD, alert, cooperative with exam HEENT: NCAT CV: RRR, good S1/S2, no murmur Resp: CTABL, no wheezes, non-labored Abd: BS present Ext: No edema, warm Neuro: Alert and oriented, No gross deficits       Assessment & Plan:

## 2012-08-07 ENCOUNTER — Other Ambulatory Visit: Payer: Medicare Other

## 2012-08-07 DIAGNOSIS — I2510 Atherosclerotic heart disease of native coronary artery without angina pectoris: Secondary | ICD-10-CM

## 2012-08-07 DIAGNOSIS — F17200 Nicotine dependence, unspecified, uncomplicated: Secondary | ICD-10-CM

## 2012-08-07 LAB — CBC WITH DIFFERENTIAL/PLATELET
Hemoglobin: 14.2 g/dL (ref 12.0–15.0)
Lymphocytes Relative: 30 % (ref 12–46)
Lymphs Abs: 1.5 10*3/uL (ref 0.7–4.0)
Monocytes Relative: 7 % (ref 3–12)
Neutro Abs: 3.2 10*3/uL (ref 1.7–7.7)
Neutrophils Relative %: 61 % (ref 43–77)
RBC: 3.95 MIL/uL (ref 3.87–5.11)
WBC: 5.2 10*3/uL (ref 4.0–10.5)

## 2012-08-07 LAB — COMPREHENSIVE METABOLIC PANEL
ALT: 20 U/L (ref 0–35)
Albumin: 4.1 g/dL (ref 3.5–5.2)
CO2: 25 mEq/L (ref 19–32)
Glucose, Bld: 88 mg/dL (ref 70–99)
Potassium: 4.5 mEq/L (ref 3.5–5.3)
Sodium: 140 mEq/L (ref 135–145)
Total Protein: 6.8 g/dL (ref 6.0–8.3)

## 2012-08-07 LAB — LIPID PANEL
Cholesterol: 159 mg/dL (ref 0–200)
LDL Cholesterol: 59 mg/dL (ref 0–99)
Triglycerides: 280 mg/dL — ABNORMAL HIGH (ref <150)

## 2012-08-07 NOTE — Progress Notes (Signed)
CMP,CBC WITH DIFF AND FLP DPNE TODAY [name] Page

## 2012-08-12 ENCOUNTER — Encounter: Payer: Self-pay | Admitting: Family Medicine

## 2012-09-23 ENCOUNTER — Other Ambulatory Visit: Payer: Self-pay | Admitting: *Deleted

## 2012-09-23 MED ORDER — PRAVASTATIN SODIUM 40 MG PO TABS: 40.0000 mg | ORAL_TABLET | Freq: Every day | ORAL | Status: DC

## 2012-09-26 ENCOUNTER — Ambulatory Visit: Payer: Medicare Other | Admitting: Family Medicine

## 2012-10-07 ENCOUNTER — Ambulatory Visit: Payer: Medicare Other | Admitting: Family Medicine

## 2012-10-17 ENCOUNTER — Ambulatory Visit (INDEPENDENT_AMBULATORY_CARE_PROVIDER_SITE_OTHER): Payer: Medicare Other | Admitting: Family Medicine

## 2012-10-17 ENCOUNTER — Encounter: Payer: Self-pay | Admitting: Family Medicine

## 2012-10-17 VITALS — BP 136/78 | HR 91 | Temp 99.1°F | Ht 62.0 in | Wt 153.6 lb

## 2012-10-17 DIAGNOSIS — K219 Gastro-esophageal reflux disease without esophagitis: Secondary | ICD-10-CM

## 2012-10-17 DIAGNOSIS — I10 Essential (primary) hypertension: Secondary | ICD-10-CM

## 2012-10-17 DIAGNOSIS — M069 Rheumatoid arthritis, unspecified: Secondary | ICD-10-CM

## 2012-10-17 DIAGNOSIS — M2550 Pain in unspecified joint: Secondary | ICD-10-CM | POA: Insufficient documentation

## 2012-10-17 MED ORDER — TRAMADOL HCL 50 MG PO TABS: 50.0000 mg | ORAL_TABLET | Freq: Four times a day (QID) | ORAL | Status: DC | PRN

## 2012-10-17 NOTE — Assessment & Plan Note (Signed)
No red flags, at goal on re-check On diltiazem, BB, ARB, and HCTZ- continue current doses

## 2012-10-17 NOTE — Assessment & Plan Note (Signed)
Not well controlled with current 40 protonix and H2 blocker.  Symptoms more concerning now that she is having dysphagia.  Possibly stricture recurrence vs barret esophagus or adenocarcinoma with longstanding disease Considered barium swallow study but with her financial limitations I think EGD would be best for diagnosis and treatment vs adding additional imaging and cost before needed EGD Established pt at [facility] - advised to call for EGD, will refer if necessary  Continue current dose of PPI and H2 blocker.

## 2012-10-17 NOTE — Patient Instructions (Signed)
Please call you [facility] Monday to set up an appointment for the EGD. I will write a new referral   I have prescribed Tramadol to help with your hand pain.   Lets follow up in 1 month about your reflux and rheumatoid arthritis.

## 2012-10-17 NOTE — Assessment & Plan Note (Signed)
Longstanding per patient and recently worsening Nodules apparent on PIP, morning symptoms, and wrist swelling/erythema seem diagnostic Avoid new NSAID now considering possibility of GI lesion  Prescribed tramadol.  F/u 1 mo, offered rheum referral but pt will wait as cost is an issue.

## 2012-10-17 NOTE — Progress Notes (Signed)
  Subjective:    Patient ID: [name] Page, female    DOB: 12-27-1945, 67 y.o.   MRN: [id number]  HPI Pt here for f/u   HTN: no chest pain, palpaitations, headaches. Taking meds as prescribed  GERD: worsening, taking 40 mg protionix + pepcid at night. Now having difficulty swallowing and continuous burning feeling when she eats, lies down and intermittently other times. Hasn't been able to eat regular food for approx 1.5 weeks. Had EGD 3-4 years ago at [facility] long she says when a stricture was dilated.    RA: worsening for 1-2 months, remembers it was better about 1 year ago when she was using celebrex. Notes worse symptoms in the am, with stiffness, some intermittenet swelling of her R wrist. Helped some by tylenol but not completely.   Review of Systems Per hPI    Objective:   Physical Exam  Gen: NAD, alert, cooperative with exam HEENT: NCAT, EOMI, PERRL CV: RRR, good S1/S2, no murmur Resp: CTABL, no wheezes, non-labored MSK: R hand with swollen PIP on 3rd and 4th digit. No erythema, mild tenderness to palpation Neuro: 4/5 strength in BL hands, sensation intact     Assessment & Plan:

## 2012-11-03 ENCOUNTER — Encounter: Payer: Self-pay | Admitting: Gastroenterology

## 2012-11-21 ENCOUNTER — Encounter: Payer: Self-pay | Admitting: Gastroenterology

## 2012-11-21 ENCOUNTER — Ambulatory Visit (INDEPENDENT_AMBULATORY_CARE_PROVIDER_SITE_OTHER): Payer: Medicare Other | Admitting: Gastroenterology

## 2012-11-21 VITALS — BP 130/70 | HR 95 | Ht 62.4 in | Wt 153.4 lb

## 2012-11-21 DIAGNOSIS — Z1211 Encounter for screening for malignant neoplasm of colon: Secondary | ICD-10-CM

## 2012-11-21 DIAGNOSIS — K219 Gastro-esophageal reflux disease without esophagitis: Secondary | ICD-10-CM

## 2012-11-21 DIAGNOSIS — R1319 Other dysphagia: Secondary | ICD-10-CM

## 2012-11-21 MED ORDER — PEG-KCL-NACL-NASULF-NA ASC-C 100 G PO SOLR: 1.0000 | Freq: Once | ORAL | Status: DC

## 2012-11-21 MED ORDER — PANTOPRAZOLE SODIUM 40 MG PO TBEC: 40.0000 mg | DELAYED_RELEASE_TABLET | Freq: Two times a day (BID) | ORAL | Status: DC

## 2012-11-21 NOTE — Progress Notes (Addendum)
History of Present Illness: This is a 67 year old female who relates difficulties swallowing liquids and solids for the past 2 months. She occasionally notes choking when swallowing. She notes she needs to touch her chin when swallowing. She has chronic GERD which have been well controlled on daily Nexium for at least 5 years. She was recently changed to pantoprazole to due costs related to insurance coverage and her reflux symptoms have worsened. She added Pepcid at bedtime but still her symptoms are not under as good control as they were with Nexium. She previously had colonoscopy by Dr. Madilyn Fireman in 2002 however do not have those records. Denies weight loss, abdominal pain, constipation, diarrhea, change in stool caliber, melena, hematochezia, nausea, vomiting, chest pain.  Review of Systems: Pertinent positive and negative review of systems were noted in the above HPI section. All other review of systems were otherwise negative.  Current Medications, Allergies, Past Medical History, Past Surgical History, Family History and Social History were reviewed in [employer] record.  Physical Exam: General: Well developed , well nourished, no acute distress Head: Normocephalic and atraumatic Eyes:  sclerae anicteric, EOMI Ears: Normal auditory acuity Mouth: No deformity or lesions Neck: Supple, no masses or thyromegaly Lungs: Clear throughout to auscultation Heart: Regular rate and rhythm; no murmurs, rubs or bruits Abdomen: Soft, non tender and non distended. No masses, hepatosplenomegaly or hernias noted. Normal Bowel sounds Rectal: Deferred to colonoscopy Musculoskeletal: Symmetrical with no gross deformities  Skin: No lesions on visible extremities Pulses:  Normal pulses noted Extremities: No clubbing, cyanosis, edema or deformities noted Neurological: Alert oriented x 4, grossly nonfocal Cervical Nodes:  No significant cervical adenopathy Inguinal Nodes: No significant  inguinal adenopathy Psychological:  Alert and cooperative. Normal mood and affect  Assessment and Recommendations:  1. GERD, solid and liquid dysphagia. Possible reflux related. Rule out stricture, esophagitis, motility disorder and oropharyngeal dysphasia. Increase pantoprazole to 40 mg twice a day and she can continue Pepcid at bedtime if her reflux symptoms are not adequately controlled. Intensify all antireflux measures. If her solid and liquid dysphagia persists without a clear cause noted on upper endoscopy proceed with MBSS. The risks, benefits, and alternatives to endoscopy with possible biopsy and possible dilation were discussed with the patient and they consent to proceed.   2. Colorectal cancer screening, average risk. Schedule colonoscopy. The risks, benefits, and alternatives to colonoscopy with possible biopsy and possible polypectomy were discussed with the patient and they consent to proceed.    12/01/12: Records from Dr Madilyn Fireman received and reviewed. Colonoscopy 05/2001 only internal hemorrhoids and poor prep in right colon. EGD with empiric esophageal dilation 05/2001 showed a duodenal stricture and was otherwise normal. CLO was negative. She was treated for GERD with Nexium daily.

## 2012-11-21 NOTE — Patient Instructions (Addendum)
You have been scheduled for an endoscopy and colonoscopy with propofol. Please follow the written instructions given to you at your visit today. Please pick up your prep at the pharmacy within the next 1-3 days. If you use inhalers (even only as needed), please bring them with you on the day of your procedure. Your physician has requested that you go to www.startemmi.com and enter the access code given to you at your visit today. This web site gives a general overview about your procedure. However, you should still follow specific instructions given to you by our office regarding your preparation for the procedure.  We have sent the following medications to your pharmacy for you to pick up at your convenience: Protonix.  Thank you for choosing me and [facility].  Venita Lick. Pleas Koch., MD., Clementeen Graham

## 2012-12-11 ENCOUNTER — Telehealth: Payer: Self-pay | Admitting: Gastroenterology

## 2012-12-11 DIAGNOSIS — Z1211 Encounter for screening for malignant neoplasm of colon: Secondary | ICD-10-CM

## 2012-12-11 DIAGNOSIS — R1319 Other dysphagia: Secondary | ICD-10-CM

## 2012-12-11 DIAGNOSIS — K219 Gastro-esophageal reflux disease without esophagitis: Secondary | ICD-10-CM

## 2012-12-11 MED ORDER — PANTOPRAZOLE SODIUM 40 MG PO TBEC: 40.0000 mg | DELAYED_RELEASE_TABLET | Freq: Two times a day (BID) | ORAL | Status: DC

## 2012-12-11 NOTE — Telephone Encounter (Signed)
Resent prescription to [facility] per patient request. Told patient to call me if they still do not receive the prescription. Pt agreed.

## 2012-12-16 ENCOUNTER — Encounter: Payer: Self-pay | Admitting: Gastroenterology

## 2012-12-16 ENCOUNTER — Ambulatory Visit (AMBULATORY_SURGERY_CENTER): Payer: Medicare Other | Admitting: Gastroenterology

## 2012-12-16 VITALS — BP 167/88 | HR 81 | Temp 99.0°F | Resp 16 | Ht 62.0 in | Wt 153.0 lb

## 2012-12-16 DIAGNOSIS — D126 Benign neoplasm of colon, unspecified: Secondary | ICD-10-CM

## 2012-12-16 DIAGNOSIS — K219 Gastro-esophageal reflux disease without esophagitis: Secondary | ICD-10-CM

## 2012-12-16 DIAGNOSIS — Z1211 Encounter for screening for malignant neoplasm of colon: Secondary | ICD-10-CM

## 2012-12-16 DIAGNOSIS — R1319 Other dysphagia: Secondary | ICD-10-CM

## 2012-12-16 DIAGNOSIS — Z800 Family history of malignant neoplasm of digestive organs: Secondary | ICD-10-CM

## 2012-12-16 MED ORDER — SODIUM CHLORIDE 0.9 % IV SOLN: 500.0000 mL | INTRAVENOUS | Status: DC

## 2012-12-16 NOTE — Op Note (Signed)
[facility] [address] [place], [address]   COLONOSCOPY PROCEDURE REPORT  PATIENT: [name] Page, [name] Page  MR#: [id number] BIRTHDATE: [birth year] , 66  yrs. old GENDER: Female ENDOSCOPIST: Meryl Dare, MD, [facility] REFERRED BY: Kevin Fenton, MD PROCEDURE DATE:  12/16/2012 PROCEDURE:   Colonoscopy with biopsy and snare polypectomy ASA CLASS:   Class II INDICATIONS:prior screening colonoscopy 05/2001, elevated risk screening, patient's immediate family history of colon cancer. MEDICATIONS: MAC sedation, administered by CRNA and propofol (Diprivan) 100mg  IV DESCRIPTION OF PROCEDURE:   After the risks benefits and alternatives of the procedure were thoroughly explained, informed consent was obtained.  A digital rectal exam revealed no abnormalities of the rectum.   The [id number]  endoscope was introduced through the anus and advanced to the cecum, which was identified by both the appendix and ileocecal valve. No adverse events experienced.   The quality of the prep was good, using MoviPrep  The instrument was then slowly withdrawn as the colon was fully examined.  COLON FINDINGS: A sessile polyp measuring 6 mm in size was found at the cecum.  A polypectomy was performed with a cold snare.  The resection was complete and the polyp tissue was completely retrieved.   A sessile polyp measuring 4 mm in size was found in the sigmoid colon.  A polypectomy was performed with cold forceps. The resection was complete and the polyp tissue was completely retrieved.   The colon was otherwise normal.  There was no diverticulosis, inflammation, polyps or cancers unless previously stated.  Retroflexed views revealed no abnormalities. The time to cecum=3 minutes 30 seconds.  Withdrawal time=10 minutes 18 seconds. The scope was withdrawn and the procedure completed.  COMPLICATIONS: There were no complications.  ENDOSCOPIC IMPRESSION: 1.   Sessile polyp measuring 6 mm  at the cecum; polypectomy performed with a cold snare 2.   Sessile polyp measuring 4 mm in the sigmoid colon; polypectomy performed with cold forceps  RECOMMENDATIONS: 1.  Await pathology results 2.  Repeat Colonoscopy in 5 years.  eSigned:  Meryl Dare, MD, [facility] 12/16/2012 2:34 PM   y]

## 2012-12-16 NOTE — Patient Instructions (Addendum)

## 2012-12-16 NOTE — Progress Notes (Signed)
Lidocaine-40mg IV prior to Propofol InductionPropofol given over incremental dosages 

## 2012-12-16 NOTE — Progress Notes (Signed)
Called to room to assist during endoscopic procedure.  Patient ID and intended procedure confirmed with present staff. Received instructions for my participation in the procedure from the performing physician.  

## 2012-12-16 NOTE — Progress Notes (Signed)
Patient did not experience any of the following events: a burn prior to discharge; a fall within the facility; wrong site/side/patient/procedure/implant event; or a hospital transfer or hospital admission upon discharge from the facility. (G8907) Patient did not have preoperative order for IV antibiotic SSI prophylaxis. (G8918)  

## 2012-12-16 NOTE — Op Note (Signed)
[facility] [address] [place], [address]   ENDOSCOPY PROCEDURE REPORT  PATIENT: [name], [name]  MR#: [id number] BIRTHDATE: [birth year] , 66  yrs. old GENDER: Female ENDOSCOPIST: Meryl Dare, MD, [facility] REFERRED BY:  Freada Bergeron, MD PROCEDURE DATE:  12/16/2012 PROCEDURE:  EGD, diagnostic ASA CLASS:     Class II INDICATIONS:  History of esophageal reflux.   Dysphagia. MEDICATIONS: There was residual sedation effect present from prior procedure, MAC sedation, administered by CRNA, and propofol (Diprivan) 100mg  IV TOPICAL ANESTHETIC: none  DESCRIPTION OF PROCEDURE: After the risks benefits and alternatives of the procedure were thoroughly explained, informed consent was obtained.  The LB [id number] endoscope was introduced through the mouth and advanced to the first portion of the duodenum. Limited by a stricture.  The instrument was slowly withdrawn as the mucosa was fully examined.  ESOPHAGUS: There was LA Class A esophagitis noted.   The esophagus was otherwise normal. STOMACH: The mucosa of the stomach appeared normal. DUODENUM: A non-traversable acquired stenosis was found in the D1/D2 junction. The duodenal mucosa showed no abnormalities in the duodenal bulb.  Retroflexed views revealed a small hiatal hernia. The scope was then withdrawn from the patient and the procedure completed.  COMPLICATIONS: There were no complications.  ENDOSCOPIC IMPRESSION: 1.   LA Class A esophagitis 2.   Small hiatal hernia 3.   Acquired stenosis in the D1/D2 junction   RECOMMENDATIONS: 1.  Anti-reflux regimen 2.  Continue PPI BID and Pepcid hs   eSigned:  Meryl Dare, MD, [facility] 12/16/2012 2:44 PM     PATIENT NAME:  [name] Page, [name] Page MR#: [id number]

## 2012-12-16 NOTE — Progress Notes (Signed)
NO EGG OR SOY ALLERGY. EMW 

## 2012-12-17 ENCOUNTER — Telehealth: Payer: Self-pay | Admitting: *Deleted

## 2012-12-17 NOTE — Telephone Encounter (Signed)
  Follow up Call-  Call back number 12/16/2012  Post procedure Call Back phone  # [phone]  Permission to leave phone message No     Patient questions:  Do you have a fever, pain , or abdominal swelling? no Pain Score  0 *  Have you tolerated food without any problems? yes  Have you been able to return to your normal activities? yes  Do you have any questions about your discharge instructions: Diet   no Medications  no Follow up visit  no  Do you have questions or concerns about your Care? no  Actions: * If pain score is 4 or above: No action needed, pain <4.

## 2012-12-22 ENCOUNTER — Encounter: Payer: Self-pay | Admitting: Gastroenterology

## 2013-02-05 ENCOUNTER — Encounter: Payer: Self-pay | Admitting: Family Medicine

## 2013-02-05 ENCOUNTER — Ambulatory Visit (INDEPENDENT_AMBULATORY_CARE_PROVIDER_SITE_OTHER): Payer: Self-pay | Admitting: Family Medicine

## 2013-02-05 VITALS — BP 136/72 | HR 93 | Ht 62.0 in | Wt 151.0 lb

## 2013-02-05 DIAGNOSIS — J309 Allergic rhinitis, unspecified: Secondary | ICD-10-CM

## 2013-02-05 DIAGNOSIS — I10 Essential (primary) hypertension: Secondary | ICD-10-CM

## 2013-02-05 DIAGNOSIS — F17200 Nicotine dependence, unspecified, uncomplicated: Secondary | ICD-10-CM

## 2013-02-05 NOTE — Assessment & Plan Note (Signed)
Pt with occasional itchy inner ear and post naasal drip for the last 1 month.  Advised daily Claritin or zyrtec PRN and benadryl with caution of sedation PRN.

## 2013-02-05 NOTE — Progress Notes (Signed)
  Subjective:    Patient ID: [name] Page, female    DOB: 08-12-1945, 67 y.o.   MRN: [id number]  HPI Patient here for followup  GERD Patient has had improvement in symptoms over the last month taking 80 mg of Protonix and Pepcid at night.   Patient complains of in her ear itching that happens occasionally over the last 1-2 months. She also notes that she has to work hard to clear her throat when she wakes up in the morning. She denies runny nose.  Tobacco abuse She is now down to 2 cigarettes a day and strongly consider quitting.   HTN She's compliant with her meds. She denies headache chest pain, palpitations.   Review of Systems Per history of present illness    Objective:   Physical Exam  Gen: NAD, alert, cooperative with exam HEENT: NCAT CV: RRR, good S1/S2, no murmur Resp: CTABL, no wheezes, non-labored     Assessment & Plan:

## 2013-02-05 NOTE — Assessment & Plan Note (Signed)
She is now down to 2 cigarettes a day and is considering quitting. I encouraged her in this and offered support as  Desired.

## 2013-02-05 NOTE — Assessment & Plan Note (Signed)
She has had improvement in her symptoms her last month taking 80 mg of protonix and 20 mg of Pepcid at night.  Patient patient with recent EGD and colonoscopy at the end of May 2014.  The findings on her EGD were in the class a esophagitis, small hiatal hernia, and duodenal stenosis.  She has had a colonoscopy which found 2 sessile polyps incompatibility to be a tubular adenoma and hyperplastic polyp without high-grade dysplasia or malignancy identified. She'll need to repeat the colonoscopy in 5 years.  I appreciate GIs recommendations and will consult and agree with them and continue 80 mg of protonix and 20 mg Pepcid qHS.   Follow up 3 months

## 2013-02-05 NOTE — Assessment & Plan Note (Signed)
At goal, no red flags.  Continue diltiazem, beta blocker, ARB, and HCTZ.

## 2013-02-05 NOTE — Patient Instructions (Addendum)
Lets follow up in three months  Try claritin or zyrtec, 1 pill a day

## 2013-04-23 ENCOUNTER — Other Ambulatory Visit: Payer: Self-pay | Admitting: Family Medicine

## 2013-04-24 NOTE — Telephone Encounter (Signed)
Refilled tramadol #30 R0 for RA, noted needs appt for more Rx to the pharmacy.  Murtis Sink, MD [facility] Family Medicine Resident, PGY-2 04/24/2013, 8:28 AM

## 2013-04-28 ENCOUNTER — Ambulatory Visit (INDEPENDENT_AMBULATORY_CARE_PROVIDER_SITE_OTHER): Payer: Medicare Other | Admitting: Family Medicine

## 2013-04-28 ENCOUNTER — Encounter: Payer: Self-pay | Admitting: Family Medicine

## 2013-04-28 VITALS — BP 128/72 | HR 80 | Ht 62.0 in | Wt 152.0 lb

## 2013-04-28 DIAGNOSIS — J449 Chronic obstructive pulmonary disease, unspecified: Secondary | ICD-10-CM

## 2013-04-28 DIAGNOSIS — I10 Essential (primary) hypertension: Secondary | ICD-10-CM

## 2013-04-28 DIAGNOSIS — F17200 Nicotine dependence, unspecified, uncomplicated: Secondary | ICD-10-CM

## 2013-04-28 DIAGNOSIS — R05 Cough: Secondary | ICD-10-CM

## 2013-04-28 MED ORDER — ALBUTEROL SULFATE HFA 108 (90 BASE) MCG/ACT IN AERS: 2.0000 | INHALATION_SPRAY | Freq: Four times a day (QID) | RESPIRATORY_TRACT | Status: DC | PRN

## 2013-04-28 NOTE — Assessment & Plan Note (Signed)
Controlled without red flags On Dilt, BB, ARB, and HCTZ, continue F/u 3 months, annual labs then

## 2013-04-28 NOTE — Assessment & Plan Note (Signed)
Has quit currently, declined pharmacalogic help Has some patches at home Quit date 1 week ago.

## 2013-04-28 NOTE — Assessment & Plan Note (Addendum)
Rx given for albuterol with current cough Doesn't appear to be exacerbation needing Abx and steroids as she is improving on her own. Discussed importance of quitting smoking.

## 2013-04-28 NOTE — Patient Instructions (Addendum)
It was great to see you today!  I have sent an inhaler to help with your cough.   Follow up in 3 months   Nurse visit in 2 weeks for Flu shot

## 2013-04-28 NOTE — Progress Notes (Signed)
  Subjective:    Patient ID: [name] Page, female    DOB: 10-11-1945, 67 y.o.   MRN: [id number]  HPI Pt here for follow up for HTN and cough for 2 weeks  Has had cough for 2 weeks productive of thick green sputum, it is now clearing up and getting less frequent. She has been using delsym for 2 days with some help. She gets a nagging cough like this approx 4 times a year.  She denies fever, chills, sweats, dyspnea  HTN reading at home are 120's/70's She denies chest pain, dyspnea, headache, and swelling She always takes her meds regularly.   Smoking HAs not smoked in 1 week now, she is stopping because she noticed it helps her cough not to. Sh ehas smoked 1 ppd for approx 30 years total.  She has known COPD a  Review of Systems Per HPI    Objective:   Physical Exam  Gen: NAD, alert, cooperative with exam HEENT: NCAT CV: RRR, good S1/S2, 1-2/6 systolic murmur loudest at R sternal border Resp: CTABL, no wheezes, non-labored Ext: No edema, Ted hose in place Neuro: Alert and oriented, No gross deficits       Assessment & Plan:

## 2013-05-11 ENCOUNTER — Telehealth: Payer: Self-pay | Admitting: Internal Medicine

## 2013-05-11 NOTE — Telephone Encounter (Signed)
Pt has not been seen since 11/2011. Advised her that she would need OV before we could refill. Also let her know that this medication is OTC. States that she will just buy this over the counter. Nothing further was needed.

## 2013-05-18 ENCOUNTER — Ambulatory Visit (INDEPENDENT_AMBULATORY_CARE_PROVIDER_SITE_OTHER): Payer: Medicare Other | Admitting: *Deleted

## 2013-05-18 DIAGNOSIS — Z23 Encounter for immunization: Secondary | ICD-10-CM

## 2013-05-18 NOTE — Progress Notes (Signed)
Pt here today for flu vaccine.  Also, wants to know if Dr. Ermalinda Memos can change Diltiazem ER 180 mg capsule to tablets.  Insurance will change in January 2015.  Capsules are $120 and unable to afford.  Tablets will be cheaper.   Will route request to Dr. Ermalinda Memos and call patient back.  Gaylene Brooks, RN

## 2013-05-19 MED ORDER — DILTIAZEM HCL 120 MG PO TABS: 120.0000 mg | ORAL_TABLET | Freq: Three times a day (TID) | ORAL | Status: DC

## 2013-05-19 NOTE — Addendum Note (Signed)
Addended by: Elenora Gamma on: 05/19/2013 05:32 PM   Modules accepted: Orders, Medications

## 2013-05-19 NOTE — Progress Notes (Signed)
Patient ID: [name] Page, female   DOB: Nov 21, 1945, 67 y.o.   MRN: [id number]   Changed 360 mg DIlt ER capsules to 120 mg tablets TID as requested by the pt. Tablets are not extended release so they will have to be take 3 times daily. Sent Rx  Murtis Sink, MD [facility] Resident, PGY-2 05/19/2013, 5:32 PM

## 2013-05-20 ENCOUNTER — Telehealth: Payer: Self-pay | Admitting: Family Medicine

## 2013-05-20 MED ORDER — FAMOTIDINE 20 MG PO TABS: ORAL_TABLET | ORAL | Status: DC

## 2013-05-20 NOTE — Progress Notes (Signed)
Called and informed patient of new diltiazem strength and dosage.  Patient verbalized understanding.  Also states she needs refills of her Pepcid--has previously had med refilled by GI MD.  Was told that she could request refill from her PCP.  Will route request to Dr. Ermalinda Memos.  Gaylene Brooks, RN

## 2013-06-26 ENCOUNTER — Other Ambulatory Visit: Payer: Self-pay | Admitting: Family Medicine

## 2013-06-30 NOTE — Telephone Encounter (Signed)
Refilled tramadol  Previously refill tramadol on October 2 of this year, stated to the pharmacy that she did of that time. Did see her 5 days later in clinic however she did not discuss her need for tramadol. Considering that she has been seen since her last fill will once more commonly reported that the dressing specifically tramadol for next refill.  Murtis Sink, MD [facility] Resident, PGY-2 06/30/2013, 10:18 AM

## 2013-08-20 ENCOUNTER — Other Ambulatory Visit: Payer: Self-pay | Admitting: Family Medicine

## 2013-09-21 ENCOUNTER — Other Ambulatory Visit: Payer: Self-pay | Admitting: Family Medicine

## 2013-09-22 ENCOUNTER — Other Ambulatory Visit: Payer: Self-pay | Admitting: Family Medicine

## 2013-10-12 ENCOUNTER — Other Ambulatory Visit: Payer: Self-pay | Admitting: Family Medicine

## 2013-11-16 ENCOUNTER — Other Ambulatory Visit: Payer: Self-pay | Admitting: Family Medicine

## 2013-11-18 ENCOUNTER — Ambulatory Visit (INDEPENDENT_AMBULATORY_CARE_PROVIDER_SITE_OTHER): Payer: Medicare Other | Admitting: Family Medicine

## 2013-11-18 ENCOUNTER — Encounter: Payer: Self-pay | Admitting: Family Medicine

## 2013-11-18 VITALS — BP 152/80 | HR 81 | Temp 97.5°F | Ht 62.0 in | Wt 162.0 lb

## 2013-11-18 DIAGNOSIS — L299 Pruritus, unspecified: Secondary | ICD-10-CM

## 2013-11-18 DIAGNOSIS — M069 Rheumatoid arthritis, unspecified: Secondary | ICD-10-CM

## 2013-11-18 DIAGNOSIS — I2510 Atherosclerotic heart disease of native coronary artery without angina pectoris: Secondary | ICD-10-CM

## 2013-11-18 DIAGNOSIS — F17200 Nicotine dependence, unspecified, uncomplicated: Secondary | ICD-10-CM

## 2013-11-18 DIAGNOSIS — I10 Essential (primary) hypertension: Secondary | ICD-10-CM

## 2013-11-18 LAB — CBC WITH DIFFERENTIAL/PLATELET
BASOS ABS: 0.1 10*3/uL (ref 0.0–0.1)
Basophils Relative: 1 % (ref 0–1)
EOS ABS: 0.2 10*3/uL (ref 0.0–0.7)
EOS PCT: 3 % (ref 0–5)
HCT: 40 % (ref 36.0–46.0)
Hemoglobin: 13.5 g/dL (ref 12.0–15.0)
Lymphocytes Relative: 40 % (ref 12–46)
Lymphs Abs: 2.9 10*3/uL (ref 0.7–4.0)
MCH: 33.6 pg (ref 26.0–34.0)
MCHC: 33.8 g/dL (ref 30.0–36.0)
MCV: 99.5 fL (ref 78.0–100.0)
Monocytes Absolute: 0.6 10*3/uL (ref 0.1–1.0)
Monocytes Relative: 9 % (ref 3–12)
Neutro Abs: 3.4 10*3/uL (ref 1.7–7.7)
Neutrophils Relative %: 47 % (ref 43–77)
PLATELETS: 242 10*3/uL (ref 150–400)
RBC: 4.02 MIL/uL (ref 3.87–5.11)
RDW: 13.5 % (ref 11.5–15.5)
WBC: 7.2 10*3/uL (ref 4.0–10.5)

## 2013-11-18 LAB — COMPREHENSIVE METABOLIC PANEL
ALT: 14 U/L (ref 0–35)
AST: 15 U/L (ref 0–37)
Albumin: 4.4 g/dL (ref 3.5–5.2)
Alkaline Phosphatase: 58 U/L (ref 39–117)
BILIRUBIN TOTAL: 0.5 mg/dL (ref 0.2–1.2)
BUN: 10 mg/dL (ref 6–23)
CO2: 31 mEq/L (ref 19–32)
CREATININE: 0.71 mg/dL (ref 0.50–1.10)
Calcium: 9.6 mg/dL (ref 8.4–10.5)
Chloride: 103 mEq/L (ref 96–112)
Glucose, Bld: 94 mg/dL (ref 70–99)
Potassium: 3.9 mEq/L (ref 3.5–5.3)
Sodium: 139 mEq/L (ref 135–145)
Total Protein: 6.9 g/dL (ref 6.0–8.3)

## 2013-11-18 MED ORDER — LOSARTAN POTASSIUM 100 MG PO TABS: 100.0000 mg | ORAL_TABLET | Freq: Every day | ORAL | Status: DC

## 2013-11-18 MED ORDER — CELECOXIB 50 MG PO CAPS: 50.0000 mg | ORAL_CAPSULE | Freq: Two times a day (BID) | ORAL | Status: DC

## 2013-11-18 MED ORDER — HYDROCHLOROTHIAZIDE 25 MG PO TABS: 25.0000 mg | ORAL_TABLET | Freq: Every day | ORAL | Status: DC

## 2013-11-18 MED ORDER — HYDROXYZINE HCL 25 MG PO TABS: 25.0000 mg | ORAL_TABLET | Freq: Three times a day (TID) | ORAL | Status: DC | PRN

## 2013-11-18 MED ORDER — TRAMADOL HCL 50 MG PO TABS: 50.0000 mg | ORAL_TABLET | Freq: Four times a day (QID) | ORAL | Status: DC | PRN

## 2013-11-18 NOTE — Assessment & Plan Note (Signed)
No apparent urticaria or rash, other allergic symptoms Change claritin to zyrtec, given Rx for hydroxyzine , discussed caution with anticholinergic in her age group but she has already used it without a problem.

## 2013-11-18 NOTE — Patient Instructions (Signed)
Great to see you today!!  I have increased your losartan, a HTN medication, you can take 2 pill sthat you have everyday until you need a refill  I have started celebrex again for your arthritis.   Please come back in 1 month to talk about your labs and check you HTN

## 2013-11-18 NOTE — Assessment & Plan Note (Signed)
HAs started back again, discussed cessation, offered help which she declined

## 2013-11-18 NOTE — Progress Notes (Addendum)
Patient ID: [name] Page, female   DOB: [birth year], 68 y.o.   MRN: [id number]  Kenn File, MD Phone: [phone]  Subjective:  Chief complaint-noted  # pt here for itching, HTN  Allergies, itching, ear symptoms Pt describes several weaks of sneezing, itching all over (no apparent rash) and ear popping. She states this happens every year and would like hydroxyzine  HTN, CAD with Hx of stent and MI See's Dr. Rex Kras at [facility], now Heart care, planning visit in June BP at home 150s/80s No dyspnea, CP, edema, or palpitations Compliant with meds but taking 50 mg of losartan not 75.   RA HAs persisitent hand pain and swelling,. No specific joints but BL hands affected.  Has liked celebrex previously, cant see rheum b/c she cant afford her co-pay Would like to try celebrex again, ultram helping some, takes every-other day or so.  Has never seen rheum or been treated with DMARDs    needs refills on several meds  ROS- No fever + sneeze, rhinnorhea, ear popping, No dyspnea No chest pain No diarrhea   Past Medical History Patient Active Problem List   Diagnosis Date Noted  . Itching 11/18/2013  . Rheumatoid arthritis 10/17/2012  . Sinus tachycardia 12/10/2011  . Cough 11/12/2011  . Otitis externa 10/12/2011  . COPD (chronic obstructive pulmonary disease) 10/01/2011  . Restrictive lung disease 10/01/2011  . ACNE VULGARIS, CYSTIC, PUSTULAR 03/28/2010  . G E R D 11/20/2006  . HYPERCHOLESTEROLEMIA 09/19/2006  . Smoker 09/19/2006  . HYPERTENSION, BENIGN SYSTEMIC 09/19/2006  . CORONARY, ARTERIOSCLEROSIS 09/19/2006  . CLAUDICATION, INTERMITTENT 09/19/2006  . RHINITIS, ALLERGIC 09/19/2006  . REFLUX ESOPHAGITIS 09/19/2006  . OSTEOARTHRITIS, MULTI SITES 09/19/2006    Medications- reviewed and updated Current Outpatient Prescriptions  Medication Sig Dispense Refill  . albuterol (PROVENTIL HFA;VENTOLIN HFA) 108 (90 BASE) MCG/ACT inhaler Inhale 2 puffs into the lungs  every 6 (six) hours as needed for wheezing.  1 Inhaler  0  . aspirin 325 MG tablet Take 1 tablet (325 mg total) by mouth daily.  30 tablet  11  . celecoxib (CELEBREX) 50 MG capsule Take 1 capsule (50 mg total) by mouth 2 (two) times daily.  60 capsule  11  . cilostazol (PLETAL) 100 MG tablet Take 1 tablet (100 mg total) by mouth 2 (two) times daily.  180 tablet  3  . diltiazem (CARDIZEM) 120 MG tablet Take 1 tablet (120 mg total) by mouth 3 (three) times daily.  90 tablet  11  . famotidine (PEPCID) 20 MG tablet One at bedtime  30 tablet  11  . hydrochlorothiazide (HYDRODIURIL) 25 MG tablet Take 1 tablet (25 mg total) by mouth daily.  90 tablet  3  . hydrOXYzine (ATARAX/VISTARIL) 25 MG tablet Take 1 tablet (25 mg total) by mouth 3 (three) times daily as needed.  60 tablet  1  . losartan (COZAAR) 100 MG tablet Take 1 tablet (100 mg total) by mouth daily.  30 tablet  11  . metoprolol succinate (TOPROL-XL) 50 MG 24 hr tablet Take 1 tablet (50 mg total) by mouth daily.  90 tablet  3  . Multiple Vitamins-Minerals (MULTIVITAMIN WITH MINERALS) tablet Take 1 tablet by mouth daily.        . nitroGLYCERIN (NITROSTAT) 0.4 MG SL tablet Place 1 tablet (0.4 mg total) under the tongue every 5 (five) minutes as needed for chest pain.  30 tablet  0  . pantoprazole (PROTONIX) 40 MG tablet Take 1 tablet (40 mg total) by  mouth 2 (two) times daily.  60 tablet  11  . pravastatin (PRAVACHOL) 40 MG tablet TAKE ONE TABLET BY MOUTH ONE TIME DAILY  90 tablet  0  . traMADol (ULTRAM) 50 MG tablet Take 1 tablet (50 mg total) by mouth every 6 (six) hours as needed.  60 tablet  0   No current facility-administered medications for this visit.    Objective: BP 152/80  Pulse 81  Temp(Src) 97.5 F (36.4 C) (Oral)  Ht 5\' 2"  (1.575 m)  Wt 162 lb (73.483 kg)  BMI 29.62 kg/m2 Gen: NAD, alert, cooperative with exam HEENT: NCAT CV: RRR, good W2/N5, 2/6 systolic murmur loudest at R sternal border/aortic area Resp: CTABL, no  wheezes, non-labored Ext: No edema, warm MSK: BL hands with ulnar deviation and erlarged/swollen PIP joints on several fingers Neuro: Alert and oriented, normal gait   Assessment/Plan:  Rheumatoid arthritis Pain continued and chronic, worse since dc celebrex 1-2 yeasrs ago Discussed risks and benfits of celebrex and she would like to re-start Would rather use DMARD but she cant afford rheum and declines my offer of starting methotrexate today HAve discussed with preceptor and would consider starting methotrexate should she decide she'd like to.  Itching No apparent urticaria or rash, other allergic symptoms Change claritin to zyrtec, given Rx for hydroxyzine , discussed caution with anticholinergic in her age group but she has already used it without a problem.    HYPERTENSION, BENIGN SYSTEMIC Elevated today, Sounds elevated at home With CAD would have goal of 130/80 Increase losartan to 100 mg daily Continue dilt, HCTZ and metoprolol as well.    CORONARY, ARTERIOSCLEROSIS Previous MI Discussed inc CVD risk with NSAID but she would still like it gioven her severe RA Cont statin, asa, BB, ARB, dilt  Smoker HAs started back again, discussed cessation, offered help which she declined    Orders Placed This Encounter  Procedures  . CBC with Differential  . Comprehensive metabolic panel    Meds ordered this encounter  Medications  . hydrochlorothiazide (HYDRODIURIL) 25 MG tablet    Sig: Take 1 tablet (25 mg total) by mouth daily.    Dispense:  90 tablet    Refill:  3  . traMADol (ULTRAM) 50 MG tablet    Sig: Take 1 tablet (50 mg total) by mouth every 6 (six) hours as needed.    Dispense:  60 tablet    Refill:  0  . hydrOXYzine (ATARAX/VISTARIL) 25 MG tablet    Sig: Take 1 tablet (25 mg total) by mouth 3 (three) times daily as needed.    Dispense:  60 tablet    Refill:  1  . losartan (COZAAR) 100 MG tablet    Sig: Take 1 tablet (100 mg total) by mouth daily.     Dispense:  30 tablet    Refill:  11    Sent this already in january  . celecoxib (CELEBREX) 50 MG capsule    Sig: Take 1 capsule (50 mg total) by mouth 2 (two) times daily.    Dispense:  60 capsule    Refill:  11

## 2013-11-18 NOTE — Assessment & Plan Note (Signed)
Previous MI Discussed inc CVD risk with NSAID but she would still like it gioven her severe RA Cont statin, asa, BB, ARB, dilt

## 2013-11-18 NOTE — Assessment & Plan Note (Signed)
Elevated today, Sounds elevated at home With CAD would have goal of 130/80 Increase losartan to 100 mg daily Continue dilt, HCTZ and metoprolol as well.

## 2013-11-18 NOTE — Assessment & Plan Note (Signed)
Pain continued and chronic, worse since dc celebrex 1-2 yeasrs ago Discussed risks and benfits of celebrex and she would like to re-start Would rather use DMARD but she cant afford rheum and declines my offer of starting methotrexate today HAve discussed with preceptor and would consider starting methotrexate should she decide she'd like to.

## 2013-11-19 ENCOUNTER — Encounter: Payer: Self-pay | Admitting: Family Medicine

## 2013-12-07 ENCOUNTER — Other Ambulatory Visit: Payer: Self-pay | Admitting: Family Medicine

## 2013-12-07 NOTE — Telephone Encounter (Signed)
Declined refill on tramadol as patient not using it much and I gave her 60 just a few weeks ago.   Laroy Apple, MD [place] Resident, PGY-2 12/07/2013, 1:39 PM

## 2013-12-22 ENCOUNTER — Ambulatory Visit (INDEPENDENT_AMBULATORY_CARE_PROVIDER_SITE_OTHER): Payer: Medicare Other | Admitting: Family Medicine

## 2013-12-22 ENCOUNTER — Encounter: Payer: Self-pay | Admitting: Family Medicine

## 2013-12-22 ENCOUNTER — Other Ambulatory Visit: Payer: Self-pay | Admitting: Gastroenterology

## 2013-12-22 VITALS — BP 136/52 | HR 78 | Temp 98.8°F | Resp 18

## 2013-12-22 DIAGNOSIS — L299 Pruritus, unspecified: Secondary | ICD-10-CM

## 2013-12-22 DIAGNOSIS — J449 Chronic obstructive pulmonary disease, unspecified: Secondary | ICD-10-CM

## 2013-12-22 DIAGNOSIS — F17200 Nicotine dependence, unspecified, uncomplicated: Secondary | ICD-10-CM

## 2013-12-22 DIAGNOSIS — M069 Rheumatoid arthritis, unspecified: Secondary | ICD-10-CM

## 2013-12-22 DIAGNOSIS — I10 Essential (primary) hypertension: Secondary | ICD-10-CM

## 2013-12-22 DIAGNOSIS — E7800 Pure hypercholesterolemia, unspecified: Secondary | ICD-10-CM

## 2013-12-22 LAB — LIPID PANEL
CHOL/HDL RATIO: 4.5 ratio
Cholesterol: 157 mg/dL (ref 0–200)
HDL: 35 mg/dL — ABNORMAL LOW (ref >39)
LDL CALC: 87 mg/dL (ref 0–99)
Triglycerides: 173 mg/dL — ABNORMAL HIGH (ref <150)
VLDL: 35 mg/dL (ref 0–40)

## 2013-12-22 NOTE — Telephone Encounter (Signed)
NEEDS OFFICE VISIT FOR ANY FURTHER REFILLS! 

## 2013-12-22 NOTE — Assessment & Plan Note (Signed)
Down to [place], encouraged cessation

## 2013-12-22 NOTE — Assessment & Plan Note (Signed)
Improved on celebrex, using tramadol PRN Cant see rheum due to monetary limitations Would like to send her at some point, for now continue what we're doing.

## 2013-12-22 NOTE — Assessment & Plan Note (Signed)
Checking lipid panel today Continue statin

## 2013-12-22 NOTE — Progress Notes (Signed)
Patient ID: [name] Page, female   DOB: [birth year], 68 y.o.   MRN: [id number]  Kenn File, MD Phone: [phone]  Subjective:  Chief complaint-noted  Pt Here for followup hypertension, itching, and hand pain  Hypertension States she takes her medications regularly, watches her salt intake, does not check her blood pressure home. She denies headache, chest pain, dyspnea, and edema  Itching Has improved with the use of Zyrtec as opposed to Claritin, feels that it may be seasonal allergies related since she seems to have problems in the spring. Also using hydroxyzine every few days for itching.  COPD Has albuterol home but only uses it rarely, she still smoking a pack lasts her about one week.  Hand pain Has started using Celebrex, she states that her pain is much better with this but that she still has morning stiffness. States she uses Ultram every once in a while when her pain is very bad.  Hyperlipidemia States she watches her diet closely and takes her pravastatin regularly. She avoids fried and fatty foods. She is interested to know her LDL is.   ROS- Per HPI Past Medical History Patient Active Problem List   Diagnosis Date Noted  . Itching 11/18/2013  . Rheumatoid arthritis 10/17/2012  . Sinus tachycardia 12/10/2011  . Cough 11/12/2011  . Otitis externa 10/12/2011  . COPD (chronic obstructive pulmonary disease) 10/01/2011  . Restrictive lung disease 10/01/2011  . ACNE VULGARIS, CYSTIC, PUSTULAR 03/28/2010  . G E R D 11/20/2006  . HYPERCHOLESTEROLEMIA 09/19/2006  . Smoker 09/19/2006  . HYPERTENSION, BENIGN SYSTEMIC 09/19/2006  . CORONARY, ARTERIOSCLEROSIS 09/19/2006  . CLAUDICATION, INTERMITTENT 09/19/2006  . RHINITIS, ALLERGIC 09/19/2006  . REFLUX ESOPHAGITIS 09/19/2006  . OSTEOARTHRITIS, MULTI SITES 09/19/2006    Medications- reviewed and updated Current Outpatient Prescriptions  Medication Sig Dispense Refill  . aspirin 325 MG tablet Take 1 tablet  (325 mg total) by mouth daily.  30 tablet  11  . celecoxib (CELEBREX) 50 MG capsule Take 1 capsule (50 mg total) by mouth 2 (two) times daily.  60 capsule  11  . cilostazol (PLETAL) 100 MG tablet Take 1 tablet (100 mg total) by mouth 2 (two) times daily.  180 tablet  3  . diltiazem (CARDIZEM) 120 MG tablet Take 1 tablet (120 mg total) by mouth 3 (three) times daily.  90 tablet  11  . hydrochlorothiazide (HYDRODIURIL) 25 MG tablet Take 1 tablet (25 mg total) by mouth daily.  90 tablet  3  . hydrOXYzine (ATARAX/VISTARIL) 25 MG tablet Take 1 tablet (25 mg total) by mouth 3 (three) times daily as needed.  60 tablet  1  . losartan (COZAAR) 100 MG tablet Take 1 tablet (100 mg total) by mouth daily.  30 tablet  11  . metoprolol succinate (TOPROL-XL) 50 MG 24 hr tablet Take 1 tablet (50 mg total) by mouth daily.  90 tablet  3  . Multiple Vitamins-Minerals (MULTIVITAMIN WITH MINERALS) tablet Take 1 tablet by mouth daily.        . pantoprazole (PROTONIX) 40 MG tablet TAKE ONE TABLET BY MOUTH TWICE DAILY  60 tablet  0  . pravastatin (PRAVACHOL) 40 MG tablet TAKE ONE TABLET BY MOUTH ONE TIME DAILY  90 tablet  0  . traMADol (ULTRAM) 50 MG tablet Take 1 tablet (50 mg total) by mouth every 6 (six) hours as needed.  60 tablet  0  . albuterol (PROVENTIL HFA;VENTOLIN HFA) 108 (90 BASE) MCG/ACT inhaler Inhale 2 puffs into the lungs every 6 (  six) hours as needed for wheezing.  1 Inhaler  0  . famotidine (PEPCID) 20 MG tablet One at bedtime  30 tablet  11  . nitroGLYCERIN (NITROSTAT) 0.4 MG SL tablet Place 1 tablet (0.4 mg total) under the tongue every 5 (five) minutes as needed for chest pain.  30 tablet  0   No current facility-administered medications for this visit.    Objective: BP 136/52  Pulse 78  Temp(Src) 98.8 F (37.1 C) (Oral)  Resp 18  SpO2 98% Gen: NAD, alert, cooperative with exam HEENT: NCAT, EOMI, PERRL CV: RRR, good S1/S2, no murmur Resp: CTABL, no wheezes, non-labored, prolong exp phase,  decreased breath sounds on exp Neuro: Alert and oriented, No gross deficits   Assessment/Plan:  HYPERCHOLESTEROLEMIA Checking lipid panel today Continue statin  HYPERTENSION, BENIGN SYSTEMIC Well controlled today Continue losartan 100, Diltiazem, HCTZ, and metoprolol Previously had tachycardia, i bet its controlled with only metop sowould consider changing dilt to amlodipine if she needs more aggressive HTN tx  Rheumatoid arthritis Improved on celebrex, using tramadol PRN Cant see rheum due to monetary limitations Would like to send her at some point, for now continue what we're doing.   Smoker Down to [place], encouraged cessation  COPD (chronic obstructive pulmonary disease) Controlled, continue PRN albuterol Lungs clear today with prolong exp phase   Orders Placed This Encounter  Procedures  . Lipid Panel

## 2013-12-22 NOTE — Assessment & Plan Note (Signed)
Well controlled today Continue losartan 100, Diltiazem, HCTZ, and metoprolol Previously had tachycardia, i bet its controlled with only metop sowould consider changing dilt to amlodipine if she needs more aggressive HTN tx

## 2013-12-22 NOTE — Patient Instructions (Signed)
Great to see you !  I'll send you a letter with your cholesterol results.   Please follow up in 3 months.

## 2013-12-22 NOTE — Assessment & Plan Note (Signed)
Controlled, continue PRN albuterol Lungs clear today with prolong exp phase

## 2013-12-25 ENCOUNTER — Encounter: Payer: Self-pay | Admitting: Family Medicine

## 2014-01-11 ENCOUNTER — Other Ambulatory Visit: Payer: Self-pay | Admitting: Family Medicine

## 2014-01-29 ENCOUNTER — Other Ambulatory Visit: Payer: Self-pay | Admitting: Gastroenterology

## 2014-02-06 ENCOUNTER — Other Ambulatory Visit: Payer: Self-pay | Admitting: Gastroenterology

## 2014-02-16 ENCOUNTER — Ambulatory Visit (INDEPENDENT_AMBULATORY_CARE_PROVIDER_SITE_OTHER): Payer: Medicare Other | Admitting: Family Medicine

## 2014-02-16 ENCOUNTER — Encounter: Payer: Self-pay | Admitting: Family Medicine

## 2014-02-16 VITALS — BP 130/74 | HR 85 | Temp 98.3°F | Ht 62.0 in | Wt 158.6 lb

## 2014-02-16 DIAGNOSIS — K219 Gastro-esophageal reflux disease without esophagitis: Secondary | ICD-10-CM

## 2014-02-16 DIAGNOSIS — I10 Essential (primary) hypertension: Secondary | ICD-10-CM

## 2014-02-16 MED ORDER — PANTOPRAZOLE SODIUM 40 MG PO TBEC: 40.0000 mg | DELAYED_RELEASE_TABLET | Freq: Every day | ORAL | Status: DC

## 2014-02-16 MED ORDER — DILTIAZEM HCL 120 MG PO TABS: 120.0000 mg | ORAL_TABLET | Freq: Three times a day (TID) | ORAL | Status: DC

## 2014-02-16 NOTE — Assessment & Plan Note (Signed)
Controlled Refill Protonix

## 2014-02-16 NOTE — Progress Notes (Signed)
Patient ID: [name] Page, female   DOB: 03/04/1946, 68 y.o.   MRN: [id number]  Kenn File, MD Phone: [phone]  Subjective:  Chief complaint-noted  Pt Here for followup of GERD and hypertension  GERD well-controlled Protonix. Recently ran out of Protonix and had to buy over-the-counter Nexium because the pharmacy said that she could not get refills.  Hypertension Taken her medications like she is prescribed every single day No headache, chest pain, dyspnea, palpitations, or leg edema  Her main complaint today is about recent medication refill challenges with the pharmacy, I reassured her that I will give the pharmacy call should that happen again and verbally: Her medications if that's necessary.  ROS-  Per history of present illness  Past Medical History Patient Active Problem List   Diagnosis Date Noted  . Itching 11/18/2013  . Rheumatoid arthritis 10/17/2012  . Sinus tachycardia 12/10/2011  . Cough 11/12/2011  . Otitis externa 10/12/2011  . COPD (chronic obstructive pulmonary disease) 10/01/2011  . Restrictive lung disease 10/01/2011  . ACNE VULGARIS, CYSTIC, PUSTULAR 03/28/2010  . G E R D 11/20/2006  . HYPERCHOLESTEROLEMIA 09/19/2006  . Smoker 09/19/2006  . HYPERTENSION, BENIGN SYSTEMIC 09/19/2006  . CORONARY, ARTERIOSCLEROSIS 09/19/2006  . CLAUDICATION, INTERMITTENT 09/19/2006  . RHINITIS, ALLERGIC 09/19/2006  . REFLUX ESOPHAGITIS 09/19/2006  . OSTEOARTHRITIS, MULTI SITES 09/19/2006    Medications- reviewed and updated Current Outpatient Prescriptions  Medication Sig Dispense Refill  . aspirin 325 MG tablet Take 1 tablet (325 mg total) by mouth daily.  30 tablet  11  . celecoxib (CELEBREX) 50 MG capsule Take 1 capsule (50 mg total) by mouth 2 (two) times daily.  60 capsule  11  . cilostazol (PLETAL) 100 MG tablet Take 1 tablet (100 mg total) by mouth 2 (two) times daily.  180 tablet  3  . diltiazem (CARDIZEM) 120 MG tablet Take 1 tablet (120 mg total)  by mouth 3 (three) times daily.  90 tablet  3  . losartan (COZAAR) 100 MG tablet Take 1 tablet (100 mg total) by mouth daily.  30 tablet  11  . metoprolol succinate (TOPROL-XL) 50 MG 24 hr tablet Take 1 tablet (50 mg total) by mouth daily.  90 tablet  3  . pantoprazole (PROTONIX) 40 MG tablet Take 1 tablet (40 mg total) by mouth daily.  90 tablet  3  . pravastatin (PRAVACHOL) 40 MG tablet Take 1 tablet (40 mg total) by mouth daily.  90 tablet  3  . albuterol (PROVENTIL HFA;VENTOLIN HFA) 108 (90 BASE) MCG/ACT inhaler Inhale 2 puffs into the lungs every 6 (six) hours as needed for wheezing.  1 Inhaler  0  . famotidine (PEPCID) 20 MG tablet One at bedtime  30 tablet  11  . hydrochlorothiazide (HYDRODIURIL) 25 MG tablet Take 1 tablet (25 mg total) by mouth daily.  90 tablet  3  . hydrOXYzine (ATARAX/VISTARIL) 25 MG tablet Take 1 tablet (25 mg total) by mouth 3 (three) times daily as needed.  60 tablet  1  . Multiple Vitamins-Minerals (MULTIVITAMIN WITH MINERALS) tablet Take 1 tablet by mouth daily.        . nitroGLYCERIN (NITROSTAT) 0.4 MG SL tablet Place 1 tablet (0.4 mg total) under the tongue every 5 (five) minutes as needed for chest pain.  30 tablet  0  . traMADol (ULTRAM) 50 MG tablet Take 1 tablet (50 mg total) by mouth every 6 (six) hours as needed.  60 tablet  0   No current facility-administered medications for  this visit.    Objective: BP 130/74  Pulse 85  Temp(Src) 98.3 F (36.8 C) (Oral)  Ht 5\' 2"  (1.575 m)  Wt 158 lb 9.6 oz (71.94 kg)  BMI 29.00 kg/m2 Gen: NAD, alert, cooperative with exam HEENT: NCAT CV: RRR, good S1/S2, no murmur Resp: CTABL, no wheezes, non-labored Ext: No edema, warm, compression stockings in place Neuro: Alert and oriented, No gross deficits   Assessment/Plan:  G E R D Controlled Refill Protonix  HYPERTENSION, BENIGN SYSTEMIC Well-controlled Refilled diltiazem, no red flags Followup 6 months or sooner if needed  Meds ordered this encounter    Medications  . DISCONTD: pantoprazole (PROTONIX) 40 MG tablet    Sig: Take 1 tablet (40 mg total) by mouth daily.    Dispense:  90 tablet    Refill:  3  . pantoprazole (PROTONIX) 40 MG tablet    Sig: Take 1 tablet (40 mg total) by mouth daily.    Dispense:  90 tablet    Refill:  3  . diltiazem (CARDIZEM) 120 MG tablet    Sig: Take 1 tablet (120 mg total) by mouth 3 (three) times daily.    Dispense:  90 tablet    Refill:  3

## 2014-02-16 NOTE — Patient Instructions (Signed)
Great to see you!  I have printed some refills, give [place] a call and leave a message and I'll call it in.   Hypertension Hypertension is another name for high blood pressure. High blood pressure forces your heart to work harder to pump blood. A blood pressure reading has two numbers, which includes a higher number over a lower number (example: 110/72). HOME CARE   Have your blood pressure rechecked by your doctor.  Only take medicine as told by your doctor. Follow the directions carefully. The medicine does not work as well if you skip doses. Skipping doses also puts you at risk for problems.  Do not smoke.  Monitor your blood pressure at home as told by your doctor. GET HELP IF:  You think you are having a reaction to the medicine you are taking.  You have repeat headaches or feel dizzy.  You have puffiness (swelling) in your ankles.  You have trouble with your vision. GET HELP RIGHT AWAY IF:   You get a very bad headache and are confused.  You feel weak, numb, or faint.  You get chest or belly (abdominal) pain.  You throw up (vomit).  You cannot breathe very well. MAKE SURE YOU:   Understand these instructions.  Will watch your condition.  Will get help right away if you are not doing well or get worse. Document Released: 12/26/2007 Document Revised: 07/14/2013 Document Reviewed: 05/01/2013 [facility] Patient Information 2015 [place], [place]. This information is not intended to replace advice given to you by your health care provider. Make sure you discuss any questions you have with your health care provider.

## 2014-02-16 NOTE — Assessment & Plan Note (Signed)
Well-controlled Refilled diltiazem, no red flags Followup 6 months or sooner if needed

## 2014-05-17 ENCOUNTER — Ambulatory Visit (INDEPENDENT_AMBULATORY_CARE_PROVIDER_SITE_OTHER): Payer: Medicare Other | Admitting: Family Medicine

## 2014-05-17 ENCOUNTER — Encounter: Payer: Self-pay | Admitting: Family Medicine

## 2014-05-17 VITALS — BP 117/65 | HR 92 | Temp 98.3°F | Ht 62.0 in | Wt 155.6 lb

## 2014-05-17 DIAGNOSIS — I10 Essential (primary) hypertension: Secondary | ICD-10-CM

## 2014-05-17 DIAGNOSIS — Z0000 Encounter for general adult medical examination without abnormal findings: Secondary | ICD-10-CM

## 2014-05-17 DIAGNOSIS — Z720 Tobacco use: Secondary | ICD-10-CM

## 2014-05-17 DIAGNOSIS — F17200 Nicotine dependence, unspecified, uncomplicated: Secondary | ICD-10-CM

## 2014-05-17 DIAGNOSIS — Z23 Encounter for immunization: Secondary | ICD-10-CM

## 2014-05-17 DIAGNOSIS — M069 Rheumatoid arthritis, unspecified: Secondary | ICD-10-CM

## 2014-05-17 MED ORDER — DILTIAZEM HCL ER COATED BEADS 360 MG PO TB24: 360.0000 mg | ORAL_TABLET | Freq: Every day | ORAL | Status: DC

## 2014-05-17 NOTE — Assessment & Plan Note (Addendum)
Well-controlled No Red flags Continue diltiazem, metoprolol, HCTZ, and losartan Diltiazem and metoprolol initially used for tachycardia Changing diltiazem from 120 3 times a day to 360 once a day Follow-up 3 months

## 2014-05-17 NOTE — Assessment & Plan Note (Signed)
Still smoking Trying to quit

## 2014-05-17 NOTE — Assessment & Plan Note (Signed)
Improved on Celebrex Continue Seen Rheumatology Due To Monetary Limitations, Would like to Refer Whenever It's Possible for DMARD

## 2014-05-17 NOTE — Progress Notes (Signed)
Patient ID: [name] Page, female   DOB: 03/21/1946, 68 y.o.   MRN: [id number]   HPI  Patient presents today for follow-up hypertension and flu shot   Hypertension Complaint with meds - Yes Checking BP at home ranging 110-150/60-70, mostly 110 to 1:30 She would like to change from 3 times a day diltiazem to once daily diltiazem,  does not remember history of tachycardia  Pertinent ROS:  Headache - No Chest pain - No Dyspnea - No Palpitations - No LE edema - No  Smoking Current area smoker, previously stopped for 6 months now smoking 3-4 cigarettes a day.  Rheumatoid arthritis Celebrex helping some, still have some intermittent pain   Smoking status noted ROS: Per HPI  Objective: BP 117/65  Pulse 92  Temp(Src) 98.3 F (36.8 C) (Oral)  Ht 5\' 2"  (1.575 m)  Wt 155 lb 9.6 oz (70.58 kg)  BMI 28.45 kg/m2 Gen: NAD, alert, cooperative with exam HEENT: NCAT CV: RRR, good S1/S2, no murmur Resp: CTABL, no wheezes, non-labored  Ext: No edema, warm Neuro: Alert and oriented, No gross deficits  Assessment and plan:  Smoker Still smoking Trying to quit  HYPERTENSION, BENIGN SYSTEMIC Well-controlled No Red flags Continue diltiazem, metoprolol, HCTZ, and losartan Diltiazem and metoprolol initially used for tachycardia Changing diltiazem from 120 3 times a day to 360 once a day Follow-up 3 months  Healthcare maintenance Flu shot today Mammogram information given, she like to change clinics Colonoscopy previously done Discussed smoking cessation  Rheumatoid arthritis Improved on Celebrex Continue Seen Rheumatology Due To Monetary Limitations, Would like to Refer Whenever It's Possible for DMARD    Meds ordered this encounter  Medications  . diltiazem (CARDIZEM LA) 360 MG 24 hr tablet    Sig: Take 1 tablet (360 mg total) by mouth daily.    Dispense:  30 tablet    Refill:  11

## 2014-05-17 NOTE — Patient Instructions (Signed)
Great to see you again  The other mammogram place is called [facility], number is  [phone]  If there is a problem with your prescription costing too much please call me  Let's follow up in 3 months

## 2014-05-17 NOTE — Assessment & Plan Note (Signed)
Flu shot today Mammogram information given, she like to change clinics Colonoscopy previously done Discussed smoking cessation

## 2014-07-26 ENCOUNTER — Other Ambulatory Visit: Payer: Self-pay | Admitting: Family Medicine

## 2014-08-19 ENCOUNTER — Ambulatory Visit (INDEPENDENT_AMBULATORY_CARE_PROVIDER_SITE_OTHER): Payer: Medicare Other | Admitting: Family Medicine

## 2014-08-19 ENCOUNTER — Encounter: Payer: Self-pay | Admitting: Family Medicine

## 2014-08-19 VITALS — BP 138/70 | HR 75 | Temp 99.0°F | Ht 62.0 in | Wt 155.6 lb

## 2014-08-19 DIAGNOSIS — I10 Essential (primary) hypertension: Secondary | ICD-10-CM

## 2014-08-19 DIAGNOSIS — Z0000 Encounter for general adult medical examination without abnormal findings: Secondary | ICD-10-CM

## 2014-08-19 DIAGNOSIS — M069 Rheumatoid arthritis, unspecified: Secondary | ICD-10-CM | POA: Diagnosis not present

## 2014-08-19 DIAGNOSIS — F17200 Nicotine dependence, unspecified, uncomplicated: Secondary | ICD-10-CM

## 2014-08-19 DIAGNOSIS — Z720 Tobacco use: Secondary | ICD-10-CM

## 2014-08-19 MED ORDER — PRAVASTATIN SODIUM 40 MG PO TABS: 40.0000 mg | ORAL_TABLET | Freq: Every day | ORAL | Status: DC

## 2014-08-19 MED ORDER — CILOSTAZOL 100 MG PO TABS
100.0000 mg | ORAL_TABLET | Freq: Two times a day (BID) | ORAL | Status: DC
Start: 2014-08-19 — End: 2015-09-22

## 2014-08-19 MED ORDER — METOPROLOL SUCCINATE ER 50 MG PO TB24: 50.0000 mg | ORAL_TABLET | Freq: Every day | ORAL | Status: DC

## 2014-08-19 MED ORDER — DILTIAZEM HCL ER 90 MG PO CP12: 90.0000 mg | ORAL_CAPSULE | Freq: Two times a day (BID) | ORAL | Status: DC

## 2014-08-19 NOTE — Assessment & Plan Note (Signed)
Encouraged mammo- given number for [facility]

## 2014-08-19 NOTE — Assessment & Plan Note (Signed)
Worsening hand pain and stiffness HAs had a hard time affording rheum Referral today Continue celebrex

## 2014-08-19 NOTE — Assessment & Plan Note (Signed)
Controlled Continue 100 chlorothiazide, losartan,  metoprolol Change diltiazem from 360 CR daily to 90 SR twice daily due to intolerance F/u 3 months

## 2014-08-19 NOTE — Patient Instructions (Signed)
Great to see you!  Try to get an appointment for your mammogram  [facility] [place], [place], [place] [address] [email or web address] [phone]  Come back in 3 months  1800 Quit now

## 2014-08-19 NOTE — Assessment & Plan Note (Signed)
Cutting back dramatically Encouraged 1800 Quit now

## 2014-08-19 NOTE — Progress Notes (Addendum)
Patient ID: [name] Page, femaleHealth maintenance DOB: [birth year], 69 y.o.   MRN: [id number]   HPI  Patient presents today for follow-up  Hypertension Has been taking medications everyday States that since she started diltiazem headaches every night and feels that the medicine is not as easily tolerated as the old dose Denies current problems with chest pain, dyspnea, palpitations, leg edema  Healthcare maint Wants mammo somewhere other than [facility] needs number Cutting back on smoking, pack lasting approx 7 days now.  Declines pharmacotherapy help[  Smoking status noted ROS: Per HPI  Objective: BP 138/70 mmHg  Pulse 75  Temp(Src) 99 F (37.2 C) (Oral)  Ht 5\' 2"  (1.575 m)  Wt 155 lb 9.6 oz (70.58 kg)  BMI 28.45 kg/m2 Gen: NAD, alert, cooperative with exam HEENT: NCAT CV: RRR, good S1/S2, no murmur Resp: CTABL, no wheezes, non-labored Neuro: Alert and oriented, No gross deficits  Assessment and plan:  Smoker Cutting back dramatically Encouraged 1800 Quit now   Rheumatoid arthritis Worsening hand pain and stiffness HAs had a hard time affording rheum Referral today Continue celebrex   HYPERTENSION, BENIGN SYSTEMIC Controlled Continue 100 chlorothiazide, losartan,  metoprolol Change diltiazem from 360 CR daily to 90 SR twice daily due to intolerance F/u 3 months   Healthcare maintenance Encouraged mammo- given number for [facility]     Orders Placed This Encounter  Procedures  . Ambulatory referral to Rheumatology    Referral Priority:  Routine    Referral Type:  Consultation    Referral Reason:  Specialty Services Required    Requested Specialty:  Rheumatology    Number of Visits Requested:  1    Meds ordered this encounter  Medications  . diltiazem (CARDIZEM SR) 90 MG 12 hr capsule    Sig: Take 1 capsule (90 mg total) by mouth 2 (two) times daily.    Dispense:  60 capsule    Refill:  11  . pravastatin (PRAVACHOL) 40 MG tablet    Sig: Take 1 tablet (40 mg total) by mouth daily.    Dispense:  90 tablet    Refill:  3  . metoprolol succinate (TOPROL-XL) 50 MG 24 hr tablet    Sig: Take 1 tablet (50 mg total) by mouth daily.    Dispense:  90 tablet    Refill:  3  . cilostazol (PLETAL) 100 MG tablet    Sig: Take 1 tablet (100 mg total) by mouth 2 (two) times daily.    Dispense:  180 tablet    Refill:  3   \

## 2014-09-20 ENCOUNTER — Other Ambulatory Visit: Payer: Self-pay | Admitting: Family Medicine

## 2014-09-21 ENCOUNTER — Other Ambulatory Visit: Payer: Self-pay | Admitting: Family Medicine

## 2014-10-05 ENCOUNTER — Ambulatory Visit: Payer: Medicare Other | Admitting: Family Medicine

## 2014-10-13 ENCOUNTER — Other Ambulatory Visit: Payer: Self-pay | Admitting: Family Medicine

## 2014-10-13 ENCOUNTER — Ambulatory Visit (INDEPENDENT_AMBULATORY_CARE_PROVIDER_SITE_OTHER): Payer: Medicare Other | Admitting: Family Medicine

## 2014-10-13 ENCOUNTER — Encounter: Payer: Self-pay | Admitting: Family Medicine

## 2014-10-13 VITALS — BP 140/76 | HR 102 | Temp 98.5°F | Ht 62.0 in | Wt 160.9 lb

## 2014-10-13 DIAGNOSIS — M069 Rheumatoid arthritis, unspecified: Secondary | ICD-10-CM | POA: Diagnosis not present

## 2014-10-13 DIAGNOSIS — Z0000 Encounter for general adult medical examination without abnormal findings: Secondary | ICD-10-CM | POA: Diagnosis not present

## 2014-10-13 DIAGNOSIS — Z720 Tobacco use: Secondary | ICD-10-CM

## 2014-10-13 DIAGNOSIS — J449 Chronic obstructive pulmonary disease, unspecified: Secondary | ICD-10-CM

## 2014-10-13 DIAGNOSIS — F17200 Nicotine dependence, unspecified, uncomplicated: Secondary | ICD-10-CM

## 2014-10-13 DIAGNOSIS — E2839 Other primary ovarian failure: Secondary | ICD-10-CM

## 2014-10-13 DIAGNOSIS — J301 Allergic rhinitis due to pollen: Secondary | ICD-10-CM

## 2014-10-13 DIAGNOSIS — I10 Essential (primary) hypertension: Secondary | ICD-10-CM | POA: Diagnosis not present

## 2014-10-13 DIAGNOSIS — Z780 Asymptomatic menopausal state: Secondary | ICD-10-CM | POA: Insufficient documentation

## 2014-10-13 MED ORDER — BISOPROLOL FUMARATE 5 MG PO TABS: 5.0000 mg | ORAL_TABLET | Freq: Every day | ORAL | Status: DC

## 2014-10-13 MED ORDER — TRAMADOL HCL 50 MG PO TABS: 50.0000 mg | ORAL_TABLET | Freq: Four times a day (QID) | ORAL | Status: DC | PRN

## 2014-10-13 NOTE — Assessment & Plan Note (Signed)
Asymptomatic, stopped smoking Changing metop to bisprolol to avoid bronchospasm

## 2014-10-13 NOTE — Assessment & Plan Note (Signed)
Worsened with spring pollen, restart cklaritin DC hydroxizine unless absolutely needs it

## 2014-10-13 NOTE — Progress Notes (Signed)
Patient ID: [name] Page, female   DOB: 01/29/1946, 69 y.o.   MRN: [id number]   HPI  Patient presents today for follow-up hypertension and medication management  Hypertension Has stopped diltiazem about 2 weeks ago due to price and feeling like she doesn't need it Has continued metoprolol, losartan, and HCTZ No chest pain, dyspnea, leg edema, palpitations  Cough and postnasal drip Has started back up with spring coming back, has started Claritin with some improvement. No fever, chills, sweats, appetite changes Notices that when she is on Claritin itching improves  Smoking Has stopped smoking about 3-4 weeks ago, had a relapse for about 3 days but has now stopped again for 3 more days.  COPD States she breathes easily, has only used her albuterol twice Has stopped smoking  PMH: Smoking status noted ROS: Per HPI  Objective: BP 140/76 mmHg  Pulse 102  Temp(Src) 98.5 F (36.9 C) (Oral)  Ht 5\' 2"  (1.575 m)  Wt 160 lb 14.4 oz (72.984 kg)  BMI 29.42 kg/m2 Gen: NAD, alert, cooperative with exam HEENT: NCAT CV: RRR, good S1/S2, no murmur Resp: CTABL, no wheezes, non-labored Ext: No edema, warm Neuro: Alert and oriented  Assessment and plan:  HYPERTENSION, BENIGN SYSTEMIC Reasonable control Stopped diltiazem due to cost Start bisoprolol as cannot maximize metop with COPD F/u 1-2  month   Smoker Now stopped X 4 weeks with relapse for 3 days   Allergic rhinitis Worsened with spring pollen, restart cklaritin DC hydroxizine unless absolutely needs it   COPD (chronic obstructive pulmonary disease) Asymptomatic, stopped smoking Changing metop to bisprolol to avoid bronchospasm   Post-menopause Screen for Osteoporosis with DEXA scan     Orders Placed This Encounter  Procedures  . DG Bone Density    Standing Status: Future     Number of Occurrences:      Standing Expiration Date: 12/13/2015    Order Specific Question:  Reason for Exam (SYMPTOM  OR  DIAGNOSIS REQUIRED)    Answer:  osteoporosis screening, higher risk due to RA    Order Specific Question:  Preferred imaging location?    Answer:  [id number]    Meds ordered this encounter  Medications  . bisoprolol (ZEBETA) 5 MG tablet    Sig: Take 1 tablet (5 mg total) by mouth daily.    Dispense:  30 tablet    Refill:  11  . traMADol (ULTRAM) 50 MG tablet    Sig: Take 1 tablet (50 mg total) by mouth every 6 (six) hours as needed.    Dispense:  60 tablet    Refill:  3

## 2014-10-13 NOTE — Patient Instructions (Signed)
Great to see you!  Stop metoprolol and diltiazem Start bisoprolol  Come back in 6-8 weeks and we will do labs  Try daily claritin instead of hydroxizine

## 2014-10-13 NOTE — Assessment & Plan Note (Signed)
Screen for Osteoporosis with DEXA scan

## 2014-10-13 NOTE — Assessment & Plan Note (Signed)
Now stopped X 4 weeks with relapse for 3 days

## 2014-10-13 NOTE — Assessment & Plan Note (Signed)
Reasonable control Stopped diltiazem due to cost Start bisoprolol as cannot maximize metop with COPD F/u 1-2  month

## 2014-10-20 ENCOUNTER — Other Ambulatory Visit: Payer: Self-pay

## 2014-10-20 ENCOUNTER — Ambulatory Visit
Admission: RE | Admit: 2014-10-20 | Discharge: 2014-10-20 | Disposition: A | Discharge status: Home / Self Care | Payer: Medicare Other | Source: Ambulatory Visit | Attending: Family Medicine | Admitting: Family Medicine

## 2014-10-20 ENCOUNTER — Ambulatory Visit
Admission: RE | Admit: 2014-10-20 | Discharge: 2014-10-20 | Disposition: A | Discharge status: Home / Self Care | Payer: Medicare Other | Source: Ambulatory Visit

## 2014-10-20 DIAGNOSIS — Z1231 Encounter for screening mammogram for malignant neoplasm of breast: Secondary | ICD-10-CM

## 2014-10-20 DIAGNOSIS — M85852 Other specified disorders of bone density and structure, left thigh: Secondary | ICD-10-CM | POA: Diagnosis not present

## 2014-10-20 DIAGNOSIS — E2839 Other primary ovarian failure: Secondary | ICD-10-CM

## 2014-10-20 DIAGNOSIS — M8588 Other specified disorders of bone density and structure, other site: Secondary | ICD-10-CM | POA: Diagnosis not present

## 2014-10-22 ENCOUNTER — Encounter: Payer: Self-pay | Admitting: Family Medicine

## 2014-11-04 ENCOUNTER — Encounter: Payer: Self-pay | Admitting: Family Medicine

## 2014-11-04 DIAGNOSIS — M8580 Other specified disorders of bone density and structure, unspecified site: Secondary | ICD-10-CM | POA: Insufficient documentation

## 2014-11-21 ENCOUNTER — Other Ambulatory Visit: Payer: Self-pay | Admitting: Family Medicine

## 2014-11-25 ENCOUNTER — Other Ambulatory Visit: Payer: Self-pay | Admitting: Family Medicine

## 2014-12-08 ENCOUNTER — Ambulatory Visit (INDEPENDENT_AMBULATORY_CARE_PROVIDER_SITE_OTHER): Payer: Medicare Other | Admitting: Family Medicine

## 2014-12-08 ENCOUNTER — Encounter: Payer: Self-pay | Admitting: Family Medicine

## 2014-12-08 VITALS — BP 115/63 | HR 81 | Temp 98.3°F | Ht 62.0 in | Wt 160.5 lb

## 2014-12-08 DIAGNOSIS — Z23 Encounter for immunization: Secondary | ICD-10-CM | POA: Diagnosis not present

## 2014-12-08 DIAGNOSIS — H9203 Otalgia, bilateral: Secondary | ICD-10-CM

## 2014-12-08 DIAGNOSIS — L299 Pruritus, unspecified: Secondary | ICD-10-CM

## 2014-12-08 DIAGNOSIS — H9209 Otalgia, unspecified ear: Secondary | ICD-10-CM | POA: Insufficient documentation

## 2014-12-08 DIAGNOSIS — J301 Allergic rhinitis due to pollen: Secondary | ICD-10-CM | POA: Diagnosis not present

## 2014-12-08 DIAGNOSIS — I10 Essential (primary) hypertension: Secondary | ICD-10-CM

## 2014-12-08 MED ORDER — PRAVASTATIN SODIUM 40 MG PO TABS: 40.0000 mg | ORAL_TABLET | Freq: Every day | ORAL | Status: DC

## 2014-12-08 MED ORDER — PANTOPRAZOLE SODIUM 40 MG PO TBEC: 40.0000 mg | DELAYED_RELEASE_TABLET | Freq: Every day | ORAL | Status: DC

## 2014-12-08 MED ORDER — HYDROXYZINE HCL 25 MG PO TABS: 25.0000 mg | ORAL_TABLET | Freq: Three times a day (TID) | ORAL | Status: DC | PRN

## 2014-12-08 NOTE — Assessment & Plan Note (Signed)
On statin, check labs

## 2014-12-08 NOTE — Patient Instructions (Addendum)
Lets get fasting labs, wait until after June 2nd  Consider allegra instead of zyrtec  Stop metoprolol, continue bisoprolol  Come back in 3 months (call for appt sooner if your blood pressures are higher than 140/90)

## 2014-12-08 NOTE — Progress Notes (Signed)
Patient ID: [name] Page, female   DOB: 02/25/1946, 69 y.o.   MRN: [id number]   HPI  Patient presents today for follow-up  Hypertension Taking meds, we reviewed meds she is taking both beta blockers, her previously and the  No chest pain, dyspnea, palpitations, leg edema, headaches  Ear pain Ear pain over the last 2 weeks or so. Notes right-sided sharp ear pain and left-sided ear irritation. Also had some bloody crust come from her left ear. No fevers, chills, sweats  Allergic rhinitis doing well with Zyrtec, no stuffy nose Celebrex is getting expensive  Smoking status noted ROS: Per HPI  Objective: BP 115/63 mmHg  Pulse 81  Temp(Src) 98.3 F (36.8 C) (Oral)  Ht 5\' 2"  (1.575 m)  Wt 160 lb 8 oz (72.802 kg)  BMI 29.35 kg/m2 Gen: NAD, alert, cooperative with exam HEENT: NCAT, TMs bilaterally have some fluid, no ear canal trauma to show reason for heme crusting CV: RRR, good S1/S2, no murmur Resp: CTABL, no wheezes, non-labored Ext: No edema, warm Neuro: Alert and oriented, No gross deficits  Assessment and plan:  Allergic rhinitis Improved with zyrtec, recommend changing to allegra considering her other symptoms   Ear pain Ear pain for 2 weeks small amount of fluid BL Discussed/offered antibiotics- omnicef Patient want sto wait, will call if not improved in 1 week Recommended allegra instead of zyrtec thinking eustacian tube dysfx may be helped that way Consider ENT if antibiotics dont help   HYPERTENSION, BENIGN SYSTEMIC controlled Med recc'ed carefully, she is taking a bisoprololand metoprolol, discontinue metoprolol fasting labs    HYPERCHOLESTEROLEMIA On statin, check labs    Orders Placed This Encounter  Procedures  . Pneumococcal conjugate vaccine 13-valent  . CBC with Differential    Standing Status: Future     Number of Occurrences:      Standing Expiration Date: 12/08/2015  . Comprehensive metabolic panel    Standing Status: Future   Number of Occurrences:      Standing Expiration Date: 12/08/2015  . TSH    Standing Status: Future     Number of Occurrences:      Standing Expiration Date: 12/08/2015  . Lipid Panel    Standing Status: Future     Number of Occurrences:      Standing Expiration Date: 12/08/2015    Meds ordered this encounter  Medications  . pravastatin (PRAVACHOL) 40 MG tablet    Sig: Take 1 tablet (40 mg total) by mouth daily.    Dispense:  90 tablet    Refill:  3  . hydrOXYzine (ATARAX/VISTARIL) 25 MG tablet    Sig: Take 1 tablet (25 mg total) by mouth 3 (three) times daily as needed.    Dispense:  60 tablet    Refill:  1  . pantoprazole (PROTONIX) 40 MG tablet    Sig: Take 1 tablet (40 mg total) by mouth daily.    Dispense:  90 tablet    Refill:  3

## 2014-12-08 NOTE — Assessment & Plan Note (Signed)
controlled Med recc'ed carefully, she is taking a bisoprololand metoprolol, discontinue metoprolol fasting labs

## 2014-12-08 NOTE — Assessment & Plan Note (Signed)
Ear pain for 2 weeks small amount of fluid BL Discussed/offered antibiotics- omnicef Patient want sto wait, will call if not improved in 1 week Recommended allegra instead of zyrtec thinking eustacian tube dysfx may be helped that way Consider ENT if antibiotics dont help

## 2014-12-08 NOTE — Assessment & Plan Note (Signed)
Improved with zyrtec, recommend changing to allegra considering her other symptoms

## 2015-01-04 ENCOUNTER — Other Ambulatory Visit: Payer: Medicare Other

## 2015-01-04 DIAGNOSIS — I10 Essential (primary) hypertension: Secondary | ICD-10-CM | POA: Diagnosis not present

## 2015-01-04 LAB — CBC WITH DIFFERENTIAL/PLATELET
Basophils Absolute: 0.1 10*3/uL (ref 0.0–0.1)
Basophils Relative: 1 % (ref 0–1)
Eosinophils Absolute: 0.3 10*3/uL (ref 0.0–0.7)
Eosinophils Relative: 5 % (ref 0–5)
HCT: 39.7 % (ref 36.0–46.0)
Hemoglobin: 13.3 g/dL (ref 12.0–15.0)
LYMPHS ABS: 2.3 10*3/uL (ref 0.7–4.0)
Lymphocytes Relative: 38 % (ref 12–46)
MCH: 33.3 pg (ref 26.0–34.0)
MCHC: 33.5 g/dL (ref 30.0–36.0)
MCV: 99.5 fL (ref 78.0–100.0)
MONO ABS: 0.5 10*3/uL (ref 0.1–1.0)
MONOS PCT: 8 % (ref 3–12)
MPV: 10.1 fL (ref 8.6–12.4)
Neutro Abs: 2.9 10*3/uL (ref 1.7–7.7)
Neutrophils Relative %: 48 % (ref 43–77)
PLATELETS: 222 10*3/uL (ref 150–400)
RBC: 3.99 MIL/uL (ref 3.87–5.11)
RDW: 13.4 % (ref 11.5–15.5)
WBC: 6 10*3/uL (ref 4.0–10.5)

## 2015-01-04 LAB — COMPREHENSIVE METABOLIC PANEL
ALBUMIN: 4.5 g/dL (ref 3.5–5.2)
ALT: 18 U/L (ref 0–35)
AST: 20 U/L (ref 0–37)
Alkaline Phosphatase: 56 U/L (ref 39–117)
BUN: 10 mg/dL (ref 6–23)
CO2: 29 mEq/L (ref 19–32)
Calcium: 9.6 mg/dL (ref 8.4–10.5)
Chloride: 105 mEq/L (ref 96–112)
Creat: 0.8 mg/dL (ref 0.50–1.10)
Glucose, Bld: 98 mg/dL (ref 70–99)
POTASSIUM: 4.4 meq/L (ref 3.5–5.3)
SODIUM: 144 meq/L (ref 135–145)
TOTAL PROTEIN: 6.9 g/dL (ref 6.0–8.3)
Total Bilirubin: 0.7 mg/dL (ref 0.2–1.2)

## 2015-01-04 LAB — LIPID PANEL
Cholesterol: 164 mg/dL (ref 0–200)
HDL: 39 mg/dL — AB (ref >=46)
LDL CALC: 85 mg/dL (ref 0–99)
TRIGLYCERIDES: 198 mg/dL — AB (ref <150)
Total CHOL/HDL Ratio: 4.2 Ratio
VLDL: 40 mg/dL (ref 0–40)

## 2015-01-04 LAB — TSH: TSH: 1.603 u[IU]/mL (ref 0.350–4.500)

## 2015-01-04 NOTE — Progress Notes (Signed)
Cbc with diff,cmp,tsh and flp done today [facility] Raider Valbuena

## 2015-01-06 ENCOUNTER — Emergency Department (HOSPITAL_COMMUNITY)
Admission: EM | Admit: 2015-01-06 | Discharge: 2015-01-07 | Disposition: A | Discharge status: Home / Self Care | Payer: Medicare Other | Attending: Emergency Medicine | Admitting: Emergency Medicine

## 2015-01-06 ENCOUNTER — Encounter (HOSPITAL_COMMUNITY): Payer: Self-pay | Admitting: Emergency Medicine

## 2015-01-06 DIAGNOSIS — J9811 Atelectasis: Secondary | ICD-10-CM | POA: Diagnosis not present

## 2015-01-06 DIAGNOSIS — Z8701 Personal history of pneumonia (recurrent): Secondary | ICD-10-CM | POA: Insufficient documentation

## 2015-01-06 DIAGNOSIS — R1013 Epigastric pain: Secondary | ICD-10-CM | POA: Insufficient documentation

## 2015-01-06 DIAGNOSIS — Z720 Tobacco use: Secondary | ICD-10-CM | POA: Insufficient documentation

## 2015-01-06 DIAGNOSIS — E785 Hyperlipidemia, unspecified: Secondary | ICD-10-CM | POA: Insufficient documentation

## 2015-01-06 DIAGNOSIS — Z79899 Other long term (current) drug therapy: Secondary | ICD-10-CM | POA: Insufficient documentation

## 2015-01-06 DIAGNOSIS — J449 Chronic obstructive pulmonary disease, unspecified: Secondary | ICD-10-CM | POA: Diagnosis not present

## 2015-01-06 DIAGNOSIS — I509 Heart failure, unspecified: Secondary | ICD-10-CM | POA: Insufficient documentation

## 2015-01-06 DIAGNOSIS — R197 Diarrhea, unspecified: Secondary | ICD-10-CM | POA: Insufficient documentation

## 2015-01-06 DIAGNOSIS — Z7982 Long term (current) use of aspirin: Secondary | ICD-10-CM | POA: Insufficient documentation

## 2015-01-06 DIAGNOSIS — R112 Nausea with vomiting, unspecified: Secondary | ICD-10-CM | POA: Insufficient documentation

## 2015-01-06 DIAGNOSIS — R109 Unspecified abdominal pain: Secondary | ICD-10-CM

## 2015-01-06 DIAGNOSIS — K219 Gastro-esophageal reflux disease without esophagitis: Secondary | ICD-10-CM | POA: Insufficient documentation

## 2015-01-06 DIAGNOSIS — I10 Essential (primary) hypertension: Secondary | ICD-10-CM | POA: Insufficient documentation

## 2015-01-06 DIAGNOSIS — Z880 Allergy status to penicillin: Secondary | ICD-10-CM | POA: Diagnosis not present

## 2015-01-06 DIAGNOSIS — Z8674 Personal history of sudden cardiac arrest: Secondary | ICD-10-CM | POA: Insufficient documentation

## 2015-01-06 DIAGNOSIS — Z86010 Personal history of colonic polyps: Secondary | ICD-10-CM | POA: Diagnosis not present

## 2015-01-06 DIAGNOSIS — K5900 Constipation, unspecified: Secondary | ICD-10-CM | POA: Diagnosis not present

## 2015-01-06 DIAGNOSIS — R1033 Periumbilical pain: Secondary | ICD-10-CM | POA: Insufficient documentation

## 2015-01-06 DIAGNOSIS — N281 Cyst of kidney, acquired: Secondary | ICD-10-CM | POA: Diagnosis not present

## 2015-01-06 LAB — COMPREHENSIVE METABOLIC PANEL
ALT: 20 U/L (ref 14–54)
AST: 23 U/L (ref 15–41)
Albumin: 4.1 g/dL (ref 3.5–5.0)
Alkaline Phosphatase: 53 U/L (ref 38–126)
Anion gap: 10 (ref 5–15)
BUN: 8 mg/dL (ref 6–20)
CALCIUM: 9.8 mg/dL (ref 8.9–10.3)
CO2: 25 mmol/L (ref 22–32)
Chloride: 100 mmol/L — ABNORMAL LOW (ref 101–111)
Creatinine, Ser: 0.79 mg/dL (ref 0.44–1.00)
GFR calc Af Amer: >60 mL/min (ref >60)
GFR calc non Af Amer: >60 mL/min (ref >60)
Glucose, Bld: 121 mg/dL — ABNORMAL HIGH (ref 65–99)
Potassium: 3.5 mmol/L (ref 3.5–5.1)
Sodium: 135 mmol/L (ref 135–145)
TOTAL PROTEIN: 7.2 g/dL (ref 6.5–8.1)
Total Bilirubin: 0.7 mg/dL (ref 0.3–1.2)

## 2015-01-06 LAB — URINALYSIS, ROUTINE W REFLEX MICROSCOPIC
BILIRUBIN URINE: NEGATIVE
Glucose, UA: NEGATIVE mg/dL
HGB URINE DIPSTICK: NEGATIVE
Ketones, ur: NEGATIVE mg/dL
Leukocytes, UA: NEGATIVE
Nitrite: NEGATIVE
PH: 7 (ref 5.0–8.0)
Protein, ur: 30 mg/dL — AB
SPECIFIC GRAVITY, URINE: 1.021 (ref 1.005–1.030)
Urobilinogen, UA: 0.2 mg/dL (ref 0.0–1.0)

## 2015-01-06 LAB — CBC WITH DIFFERENTIAL/PLATELET
BASOS ABS: 0 10*3/uL (ref 0.0–0.1)
Basophils Relative: 0 % (ref 0–1)
EOS PCT: 1 % (ref 0–5)
Eosinophils Absolute: 0.1 10*3/uL (ref 0.0–0.7)
HEMATOCRIT: 42.2 % (ref 36.0–46.0)
Hemoglobin: 14.5 g/dL (ref 12.0–15.0)
Lymphocytes Relative: 22 % (ref 12–46)
Lymphs Abs: 1.9 10*3/uL (ref 0.7–4.0)
MCH: 33.9 pg (ref 26.0–34.0)
MCHC: 34.4 g/dL (ref 30.0–36.0)
MCV: 98.6 fL (ref 78.0–100.0)
Monocytes Absolute: 0.4 10*3/uL (ref 0.1–1.0)
Monocytes Relative: 4 % (ref 3–12)
NEUTROS ABS: 6.3 10*3/uL (ref 1.7–7.7)
Neutrophils Relative %: 73 % (ref 43–77)
Platelets: 216 10*3/uL (ref 150–400)
RBC: 4.28 MIL/uL (ref 3.87–5.11)
RDW: 12.6 % (ref 11.5–15.5)
WBC: 8.6 10*3/uL (ref 4.0–10.5)

## 2015-01-06 LAB — URINE MICROSCOPIC-ADD ON

## 2015-01-06 LAB — I-STAT TROPONIN, ED: Troponin i, poc: 0 ng/mL (ref 0.00–0.08)

## 2015-01-06 LAB — LIPASE, BLOOD: Lipase: 24 U/L (ref 22–51)

## 2015-01-06 NOTE — ED Notes (Signed)
The patient said she has been throwing up since yesterday.  She has also been having nausea and diarrhea.  The patient said she has been sick since 1400 yesterday.  She rates her pain 6/10.

## 2015-01-07 ENCOUNTER — Encounter (HOSPITAL_COMMUNITY): Payer: Self-pay | Admitting: Radiology

## 2015-01-07 ENCOUNTER — Emergency Department (HOSPITAL_COMMUNITY): Payer: Medicare Other

## 2015-01-07 DIAGNOSIS — J9811 Atelectasis: Secondary | ICD-10-CM | POA: Diagnosis not present

## 2015-01-07 DIAGNOSIS — N281 Cyst of kidney, acquired: Secondary | ICD-10-CM | POA: Diagnosis not present

## 2015-01-07 DIAGNOSIS — K5900 Constipation, unspecified: Secondary | ICD-10-CM | POA: Diagnosis not present

## 2015-01-07 MED ORDER — ALUMINUM-MAGNESIUM-SIMETHICONE 200-200-20 MG/5ML PO SUSP: 15.0000 mL | Freq: Three times a day (TID) | ORAL | Status: DC

## 2015-01-07 MED ORDER — ONDANSETRON 4 MG PO TBDP: 4.0000 mg | ORAL_TABLET | Freq: Three times a day (TID) | ORAL | Status: DC | PRN

## 2015-01-07 MED ORDER — SODIUM CHLORIDE 0.9 % IV BOLUS (SEPSIS)
500.0000 mL | Freq: Once | INTRAVENOUS | Status: AC
  Administered 2015-01-07: 500 mL via INTRAVENOUS

## 2015-01-07 MED ORDER — IOHEXOL 300 MG/ML  SOLN
25.0000 mL | Freq: Once | INTRAMUSCULAR | Status: AC | PRN
  Administered 2015-01-07: 25 mL via ORAL

## 2015-01-07 MED ORDER — MORPHINE SULFATE 4 MG/ML IJ SOLN
4.0000 mg | Freq: Once | INTRAMUSCULAR | Status: AC
  Administered 2015-01-07: 4 mg via INTRAVENOUS
  Filled 2015-01-07: qty 1

## 2015-01-07 MED ORDER — IOHEXOL 300 MG/ML  SOLN
100.0000 mL | Freq: Once | INTRAMUSCULAR | Status: AC | PRN
  Administered 2015-01-07: 100 mL via INTRAVENOUS

## 2015-01-07 MED ORDER — DICYCLOMINE HCL 20 MG PO TABS: 20.0000 mg | ORAL_TABLET | Freq: Two times a day (BID) | ORAL | Status: DC

## 2015-01-07 MED ORDER — ONDANSETRON HCL 4 MG/2ML IJ SOLN
4.0000 mg | Freq: Once | INTRAMUSCULAR | Status: AC
  Administered 2015-01-07: 4 mg via INTRAVENOUS
  Filled 2015-01-07: qty 2

## 2015-01-07 NOTE — ED Notes (Signed)
PA made aware of pt's BP.  

## 2015-01-07 NOTE — ED Notes (Signed)
PA at bedside.

## 2015-01-07 NOTE — ED Provider Notes (Signed)
Patient care acquired from [facility], PA-C pending acute abdominal series.  Discuss x-ray results with patient. Reevaluation still with generalized abdominal tenderness without peritoneal signs. Given continued tenderness we'll obtain CT scan to ensure no small bowel obstruction. Patient is agreeable to plan. Declines any more pain medication at this time.   Results for orders placed or performed during the hospital encounter of 01/06/15  CBC with Differential  Result Value Ref Range   WBC 8.6 4.0 - 10.5 K/uL   RBC 4.28 3.87 - 5.11 MIL/uL   Hemoglobin 14.5 12.0 - 15.0 g/dL   HCT 42.2 36.0 - 46.0 %   MCV 98.6 78.0 - 100.0 fL   MCH 33.9 26.0 - 34.0 pg   MCHC 34.4 30.0 - 36.0 g/dL   RDW 12.6 11.5 - 15.5 %   Platelets 216 150 - 400 K/uL   Neutrophils Relative % 73 43 - 77 %   Neutro Abs 6.3 1.7 - 7.7 K/uL   Lymphocytes Relative 22 12 - 46 %   Lymphs Abs 1.9 0.7 - 4.0 K/uL   Monocytes Relative 4 3 - 12 %   Monocytes Absolute 0.4 0.1 - 1.0 K/uL   Eosinophils Relative 1 0 - 5 %   Eosinophils Absolute 0.1 0.0 - 0.7 K/uL   Basophils Relative 0 0 - 1 %   Basophils Absolute 0.0 0.0 - 0.1 K/uL  Comprehensive metabolic panel  Result Value Ref Range   Sodium 135 135 - 145 mmol/L   Potassium 3.5 3.5 - 5.1 mmol/L   Chloride 100 (L) 101 - 111 mmol/L   CO2 25 22 - 32 mmol/L   Glucose, Bld 121 (H) 65 - 99 mg/dL   BUN 8 6 - 20 mg/dL   Creatinine, Ser 0.79 0.44 - 1.00 mg/dL   Calcium 9.8 8.9 - 10.3 mg/dL   Total Protein 7.2 6.5 - 8.1 g/dL   Albumin 4.1 3.5 - 5.0 g/dL   AST 23 15 - 41 U/L   ALT 20 14 - 54 U/L   Alkaline Phosphatase 53 38 - 126 U/L   Total Bilirubin 0.7 0.3 - 1.2 mg/dL   GFR calc non Af Amer >60 >60 mL/min   GFR calc Af Amer >60 >60 mL/min   Anion gap 10 5 - 15  Lipase, blood  Result Value Ref Range   Lipase 24 22 - 51 U/L  Urinalysis, Routine w reflex microscopic (not at [facility])  Result Value Ref Range   Color, Urine YELLOW YELLOW   APPearance CLEAR CLEAR   Specific  Gravity, Urine 1.021 1.005 - 1.030   pH 7.0 5.0 - 8.0   Glucose, UA NEGATIVE NEGATIVE mg/dL   Hgb urine dipstick NEGATIVE NEGATIVE   Bilirubin Urine NEGATIVE NEGATIVE   Ketones, ur NEGATIVE NEGATIVE mg/dL   Protein, ur 30 (A) NEGATIVE mg/dL   Urobilinogen, UA 0.2 0.0 - 1.0 mg/dL   Nitrite NEGATIVE NEGATIVE   Leukocytes, UA NEGATIVE NEGATIVE  Urine microscopic-add on  Result Value Ref Range   Squamous Epithelial / LPF RARE RARE   WBC, UA 0-2 <3 WBC/hpf   RBC / HPF 0-2 <3 RBC/hpf   Urine-Other MUCOUS PRESENT   I-stat troponin, ED (only if pt is 69 y.o. or older & pain is above umbilicus)  not at [facility]  Result Value Ref Range   Troponin i, poc 0.00 0.00 - 0.08 ng/mL   Comment 3           Ct Abdomen Pelvis W Contrast  01/07/2015   CLINICAL DATA:  Acute onset of vomiting. Nausea and diarrhea. Lower abdominal pain. Initial encounter.  EXAM: CT ABDOMEN AND PELVIS WITH CONTRAST  TECHNIQUE: Multidetector CT imaging of the abdomen and pelvis was performed using the standard protocol following bolus administration of intravenous contrast.  CONTRAST:  138mL OMNIPAQUE IOHEXOL 300 MG/ML  SOLN  COMPARISON:  CT of the abdomen and pelvis from 03/30/2010, and abdominal radiograph performed earlier today at 12:38 a.m.  FINDINGS: The visualized lung bases are clear. Diffuse coronary artery calcifications are seen.  The liver and spleen are unremarkable in appearance. The gallbladder is within normal limits. The pancreas and adrenal glands are unremarkable.  A 1.8 cm cyst is noted at the lower pole of the right kidney. There is no evidence of hydronephrosis. No renal or ureteral stones are seen. No perinephric stranding is appreciated.  No free fluid is identified. The small bowel is unremarkable in appearance. Mild mucosal thickening at the fundus of the stomach is nonspecific and may be within normal limits. No acute vascular abnormalities are seen. Diffuse calcification is seen along the abdominal aorta and  its branches. There is mild ectasia of the distal abdominal aorta, without evidence of aneurysmal dilatation.  The appendix is normal in caliber and contains air, tracking along the mid pelvis. The cecum is noted at the right hemipelvis. The colon is unremarkable in appearance.  The bladder is mildly distended and grossly unremarkable. The uterus is grossly unremarkable. The ovaries are relatively symmetric. No suspicious adnexal masses are seen. No inguinal lymphadenopathy is seen.  No acute osseous abnormalities are identified. Vacuum phenomenon is noted at L5-S1, with underlying facet disease.  IMPRESSION: 1. Mild mucosal thickening at the fundus of the stomach is nonspecific and may be within normal limits, or could reflect mild gastritis given the patient's symptoms. 2. Diffuse coronary artery calcifications seen. 3. Diffuse calcification along the abdominal aorta and its branches. 4. Small right renal cyst seen.   Electronically Signed   By: Garald Balding M.D.   On: 01/07/2015 03:04   Dg Abd Acute W/chest  01/07/2015   CLINICAL DATA:  Acute onset of generalized abdominal pain. Vomiting, diarrhea and constipation. Initial encounter.  EXAM: DG ABDOMEN ACUTE W/ 1V CHEST  COMPARISON:  Chest radiograph performed 10/01/2011, and abdominal radiograph from 04/03/2010  FINDINGS: The lungs are well-aerated. Mild left basilar atelectasis is noted. There is no evidence of pleural effusion or pneumothorax. The cardiomediastinal silhouette is within normal limits.  The visualized bowel gas pattern is unremarkable. Scattered stool and air are seen within the colon; there is no evidence of small bowel dilatation to suggest obstruction. No free intra-abdominal air is identified on the provided upright view.  No acute osseous abnormalities are seen; the sacroiliac joints are unremarkable in appearance.  IMPRESSION: 1. Unremarkable bowel gas pattern; no free intra-abdominal air seen. Small to moderate amount of stool noted  in the colon. 2. Mild left basilar atelectasis noted; lungs otherwise clear.   Electronically Signed   By: Garald Balding M.D.   On: 01/07/2015 01:14    1. Abdominal pain, acute    I have reviewed nursing notes, vital signs, and all appropriate lab and imaging results if ordered as above.   Discussed CT scan results with patient. She is requesting to be discharged home. Patient able to tolerate fluids without difficulty. Will discharge home with symptomatic medications with advised PCP follow-up. Strict return precautions were discussed. Patient is agreeable to plan. Patient is stable at  time of discharge   Patient d/w with Dr. Claudine Mouton, agrees with plan.    Baron Sane, PA-C 01/07/15 [id number]  Everlene Balls, MD 01/07/15 [phone]

## 2015-01-07 NOTE — ED Notes (Signed)
Patient unable to sign patient aware of discharge instructions.

## 2015-01-07 NOTE — ED Provider Notes (Signed)
CSN: [id number]     Arrival date & time 01/06/15  2203 History   First MD Initiated Contact with Patient 01/07/15 0006     Chief Complaint  Patient presents with  . Abdominal Pain    The patient said she has been throwing up since yesterday.  She has also been having nausea and diarrhea.       (Consider location/radiation/quality/duration/timing/severity/associated sxs/prior Treatment) HPI Comments: Patient with history of MI, CHF, IBS, bowel obstruction approximately 10 years ago, history of cesarean section -- presents with complaints of abdominal pain, persistent nausea and vomiting starting yesterday evening (> 24 hrs ago). Patient states that she had a small watery bowel movement this morning that was nonbloody. She has been having multiple episodes of vomiting since onset and has been unable to keep down solids or fluids. She states that she is not currently passing gas. No urinary symptoms. No fevers, chest pain, or shortness of breath. She took antacids which helped slightly but symptoms then worsened. The onset of this condition was acute. The course is constant. Aggravating factors: none. Alleviating factors: none.    Patient is a 69 y.o. female presenting with abdominal pain. The history is provided by the patient.  Abdominal Pain Associated symptoms: diarrhea, nausea and vomiting   Associated symptoms: no chest pain, no cough, no dysuria, no fever and no sore throat     Past Medical History  Diagnosis Date  . Hypertension   . Heart attack 1997 and 2001  . Hyperlipidemia   . GERD (gastroesophageal reflux disease)   . [place] tarry stools     from iron pills  . Colon polyps 2005  . CHF (congestive heart failure) 1997  . COPD (chronic obstructive pulmonary disease)   . Status post dilation of esophageal narrowing   . Pneumonia 2012   Past Surgical History  Procedure Laterality Date  . Coronary angioplasty with stent placement  2002  . Polypectomy    . Colonoscopy      Family History  Problem Relation Age of Onset  . Heart disease Father   . Colon cancer Mother 37    EARLY 70'S  . Clotting disorder Brother   . Diabetes Sister    History  Substance Use Topics  . Smoking status: Current Some Day Smoker -- 0.30 packs/day    Types: Cigarettes  . Smokeless tobacco: Never Used  . Alcohol Use: 3.5 oz/week    7 drink(s) per week   OB History    No data available     Review of Systems  Constitutional: Negative for fever.  HENT: Negative for rhinorrhea and sore throat.   Eyes: Negative for redness.  Respiratory: Negative for cough.   Cardiovascular: Negative for chest pain.  Gastrointestinal: Positive for nausea, vomiting, abdominal pain and diarrhea.  Genitourinary: Negative for dysuria.  Musculoskeletal: Negative for myalgias.  Skin: Negative for rash.  Neurological: Negative for headaches.      Allergies  Lisinopril and Penicillins  Home Medications   Prior to Admission medications   Medication Sig Start Date End Date Taking? Authorizing Provider  aspirin 325 MG tablet Take 1 tablet (325 mg total) by mouth daily. 04/12/11  Yes Deneise Lever, MD  bisoprolol (ZEBETA) 5 MG tablet Take 1 tablet (5 mg total) by mouth daily. 10/13/14  Yes Timmothy Euler, MD  celecoxib (CELEBREX) 50 MG capsule TAKE ONE CAPSULE BY MOUTH TWICE DAILY 11/22/14  Yes Timmothy Euler, MD  cilostazol (PLETAL) 100 MG tablet Take 1  tablet (100 mg total) by mouth 2 (two) times daily. 08/19/14  Yes Timmothy Euler, MD  hydrochlorothiazide (HYDRODIURIL) 25 MG tablet TAKE 1 TABLET BY MOUTH EVERY DAY 11/22/14  Yes Timmothy Euler, MD  losartan (COZAAR) 100 MG tablet TAKE 1 TABLET BY MOUTH EVERY DAY 11/25/14  Yes Timmothy Euler, MD  Multiple Vitamins-Minerals (MULTIVITAMIN WITH MINERALS) tablet Take 1 tablet by mouth daily.     Yes Historical Provider, MD  pantoprazole (PROTONIX) 40 MG tablet Take 1 tablet (40 mg total) by mouth daily. 12/08/14  Yes Timmothy Euler, MD   pravastatin (PRAVACHOL) 40 MG tablet Take 1 tablet (40 mg total) by mouth daily. Patient taking differently: Take 40 mg by mouth every evening.  12/08/14  Yes Timmothy Euler, MD  albuterol (PROVENTIL HFA;VENTOLIN HFA) 108 (90 BASE) MCG/ACT inhaler Inhale 2 puffs into the lungs every 6 (six) hours as needed for wheezing. Patient not taking: Reported on 10/13/2014 04/28/13   Timmothy Euler, MD  famotidine (PEPCID) 20 MG tablet One at bedtime 05/20/13 05/20/14  Timmothy Euler, MD  hydrOXYzine (ATARAX/VISTARIL) 25 MG tablet Take 1 tablet (25 mg total) by mouth 3 (three) times daily as needed. Patient not taking: Reported on 01/06/2015 12/08/14   Timmothy Euler, MD   BP 160/83 mmHg  Pulse 102  Temp(Src) 98.6 F (37 C) (Oral)  Resp 20  SpO2 98%   Physical Exam  Constitutional: She appears well-developed and well-nourished.  HENT:  Head: Normocephalic and atraumatic.  Eyes: Conjunctivae are normal. Right eye exhibits no discharge. Left eye exhibits no discharge.  Neck: Normal range of motion. Neck supple.  Cardiovascular: Normal rate, regular rhythm and normal heart sounds.   Pulmonary/Chest: Effort normal and breath sounds normal.  Abdominal: Soft. Bowel sounds are increased. There is tenderness (mild to moderate) in the epigastric area and periumbilical area. There is no rigidity, no rebound, no guarding, no tenderness at McBurney's point and negative Murphy's sign.  Neurological: She is alert.  Skin: Skin is warm and dry.  Psychiatric: She has a normal mood and affect.  Nursing note and vitals reviewed.   ED Course  Procedures (including critical care time) Labs Review Labs Reviewed  COMPREHENSIVE METABOLIC PANEL - Abnormal; Notable for the following:    Chloride 100 (*)    Glucose, Bld 121 (*)    All other components within normal limits  URINALYSIS, ROUTINE W REFLEX MICROSCOPIC (NOT AT [facility]) - Abnormal; Notable for the following:    Protein, ur 30 (*)    All other  components within normal limits  CBC WITH DIFFERENTIAL/PLATELET  LIPASE, BLOOD  URINE MICROSCOPIC-ADD ON  I-STAT TROPOININ, ED    Imaging Review No results found.   EKG Interpretation None       12:18 AM Patient seen and examined. Lab work is reassuring however history and exam is conserning for SBO. Work-up initiated. Medications ordered.   Vital signs reviewed and are as follows: BP 160/83 mmHg  Pulse 102  Temp(Src) 98.6 F (37 C) (Oral)  Resp 20  SpO2 98%  ED ECG REPORT   Date: 01/07/2015  Rate: 94  Rhythm: normal sinus rhythm  QRS Axis: normal  Intervals: normal  ST/T Wave abnormalities: nonspecific ST changes  Conduction Disutrbances:none  Narrative Interpretation:   Old EKG Reviewed: changes noted, slower  I have personally reviewed the EKG tracing and agree with the computerized printout as noted.  Handoff to Shelby PA-C at shift change.    MDM  Final diagnoses:  Abdominal pain, acute    Pending completion of workup.     Carlisle Cater, PA-C 01/07/15 1203  Everlene Balls, MD 01/07/15 [phone]

## 2015-01-07 NOTE — Discharge Instructions (Signed)
Please follow up with your primary care physician in 1-2 days. If you do not have one please call the [facility] number listed above. Please read all discharge instructions and return precautions.  ° ° °Abdominal Pain °Many things can cause abdominal pain. Usually, abdominal pain is not caused by a disease and will improve without treatment. It can often be observed and treated at home. Your health care provider will do a physical exam and possibly order blood tests and X-rays to help determine the seriousness of your pain. However, in many cases, more time must pass before a clear cause of the pain can be found. Before that point, your health care provider may not know if you need more testing or further treatment. °HOME CARE INSTRUCTIONS  °Monitor your abdominal pain for any changes. The following actions may help to alleviate any discomfort you are experiencing: °· Only take over-the-counter or prescription medicines as directed by your health care provider. °· Do not take laxatives unless directed to do so by your health care provider. °· Try a clear liquid diet (broth, tea, or water) as directed by your health care provider. Slowly move to a bland diet as tolerated. °SEEK MEDICAL CARE IF: °· You have unexplained abdominal pain. °· You have abdominal pain associated with nausea or diarrhea. °· You have pain when you urinate or have a bowel movement. °· You experience abdominal pain that wakes you in the night. °· You have abdominal pain that is worsened or improved by eating food. °· You have abdominal pain that is worsened with eating fatty foods. °· You have a fever. °SEEK IMMEDIATE MEDICAL CARE IF:  °· Your pain does not go away within 2 hours. °· You keep throwing up (vomiting). °· Your pain is felt only in portions of the abdomen, such as the right side or the left lower portion of the abdomen. °· You pass bloody or [place] tarry stools. °MAKE SURE YOU: °· Understand these instructions.    °· Will watch your condition.   °· Will get help right away if you are not doing well or get worse.   °Document Released: 04/18/2005 Document Revised: 07/14/2013 Document Reviewed: 03/18/2013 °Exit[employer]® Patient Information ©2015 [employer]. This information is not intended to replace advice given to you by your health care provider. Make sure you discuss any questions you have with your health care provider. ° ° °

## 2015-01-10 ENCOUNTER — Encounter: Payer: Self-pay | Admitting: Family Medicine

## 2015-01-21 ENCOUNTER — Encounter: Payer: Self-pay | Admitting: Family Medicine

## 2015-01-21 ENCOUNTER — Ambulatory Visit (INDEPENDENT_AMBULATORY_CARE_PROVIDER_SITE_OTHER): Payer: Medicare Other | Admitting: Family Medicine

## 2015-01-21 VITALS — BP 151/73 | HR 70 | Temp 98.2°F | Ht 62.0 in | Wt 157.5 lb

## 2015-01-21 DIAGNOSIS — R112 Nausea with vomiting, unspecified: Secondary | ICD-10-CM | POA: Diagnosis not present

## 2015-01-21 NOTE — Patient Instructions (Signed)
It was a pleasure seeing you today in our clinic. Today we discussed your previous ED visit. Here is the treatment plan we have discussed and agreed upon together:   - I am happy to know that your symptoms resolved relatively quickly after your visit to the Emergency department.  - I look forward to getting to know you better over the next 2 years. If you have any questions please don't hesitate to contact me and I'll do my best to address them promptly.

## 2015-01-21 NOTE — Assessment & Plan Note (Addendum)
Patient feeling well after being seen in ED for vomiting. No episode of vomiting >1wk. No CP/SOB. - Patient to continue medication regimen.  - Discussed her med list. - Continue ED prescribed medications PRN for n/v

## 2015-01-21 NOTE — Progress Notes (Signed)
   HPI  CC: ED f/u Patient was seen on 6/16 in the ED for abdominal pain and vomiting. Patient states that she came to the ED b/c she feared this was another episode of Colitis (which she reports she has had in the past). She was [facility] after being given some medications and feeling a bit better. She reports her symptoms had persisted after DC from the ED for ~3 days. Since that time she has had no symptoms and "feels great". Denies N/V/D/C, no abdominal pain, fevers, chills, diaphoresis, CP, SOB, weakness, numbness, or trauma.  Review of Systems   See HPI for ROS. All other systems reviewed and are negative.  Past medical history and social history reviewed and updated in the EMR as appropriate.  Objective: BP 151/73 mmHg  Pulse 70  Temp(Src) 98.2 F (36.8 C) (Oral)  Ht 5\' 2"  (1.575 m)  Wt 157 lb 8 oz (71.442 kg)  BMI 28.80 kg/m2 Gen: NAD, alert, cooperative, and pleasant. CV: RRR, no murmur Resp: CTAB, no wheezes, non-labored Abd: SNTND, BS present, no guarding or organomegaly Ext: No edema, warm Neuro: Alert and oriented, Speech clear, No gross deficits  Assessment and plan:  Nausea with vomiting Patient feeling well after being seen in ED for vomiting. No episode of vomiting >1wk. No CP/SOB. - Patient to continue medication regimen.  - Discussed her med list. - Continue ED prescribed medications PRN for n/v    Elberta Leatherwood, MD,MS,  PGY1 01/21/2015 6:07 PM

## 2015-02-09 ENCOUNTER — Other Ambulatory Visit: Payer: Self-pay | Admitting: Family Medicine

## 2015-03-16 ENCOUNTER — Other Ambulatory Visit: Payer: Self-pay | Admitting: *Deleted

## 2015-03-16 MED ORDER — LOSARTAN POTASSIUM 100 MG PO TABS: 100.0000 mg | ORAL_TABLET | Freq: Every day | ORAL | Status: DC

## 2015-05-12 ENCOUNTER — Ambulatory Visit (INDEPENDENT_AMBULATORY_CARE_PROVIDER_SITE_OTHER): Payer: Medicare Other | Admitting: Family Medicine

## 2015-05-12 ENCOUNTER — Encounter: Payer: Self-pay | Admitting: Family Medicine

## 2015-05-12 VITALS — BP 138/78 | HR 60 | Temp 98.4°F | Ht 62.0 in | Wt 155.0 lb

## 2015-05-12 DIAGNOSIS — M069 Rheumatoid arthritis, unspecified: Secondary | ICD-10-CM | POA: Diagnosis not present

## 2015-05-12 DIAGNOSIS — Z23 Encounter for immunization: Secondary | ICD-10-CM | POA: Diagnosis not present

## 2015-05-12 DIAGNOSIS — I10 Essential (primary) hypertension: Secondary | ICD-10-CM | POA: Diagnosis not present

## 2015-05-12 MED ORDER — TRAMADOL HCL 50 MG PO TABS: 50.0000 mg | ORAL_TABLET | Freq: Four times a day (QID) | ORAL | Status: DC | PRN

## 2015-05-12 MED ORDER — LOSARTAN POTASSIUM 100 MG PO TABS: 100.0000 mg | ORAL_TABLET | Freq: Every day | ORAL | Status: DC

## 2015-05-12 MED ORDER — CELECOXIB 50 MG PO CAPS: 50.0000 mg | ORAL_CAPSULE | Freq: Two times a day (BID) | ORAL | Status: DC

## 2015-05-12 NOTE — Patient Instructions (Signed)
It was a pleasure seeing you today in our clinic. Today we discussed your blood pressure. Here is the treatment plan we have discussed and agreed upon together:   - Keep up the great work. - No need to change and medications today. - Let me know if there is anything I can do to try to help with medication costs. - Don't hesitate to call if you have any questions.

## 2015-05-12 NOTE — Assessment & Plan Note (Signed)
Patient reporting good pain relief with current medication regimen with tramadol and Celebrex. Unfortunately patient reports that Celebrex has been increasingly expensive and difficult to afford. - As able to show patient additional resources on goodRx.com - Coupon for generic Celebrex printed as well as new prescription for Celebrex printed and given to patient. - Tramadol refilled.

## 2015-05-12 NOTE — Progress Notes (Signed)
   HPI  CC: Blood pressure check Patient is here for a blood pressure check and flu shot. She states she has been feeling well and has no current complaints at this time. She reports good compliance with all medications and minimal to no side effects at this time. She is asking for some refills on some medications otherwise has no requests.  ROS: Denies fatigue headache vision changes shortness of breath chest pain cough abdominal pain nausea vomiting diarrhea hematochezia melena weakness numbness paresthesias.  Objective: BP 138/78 mmHg  Pulse 60  Temp(Src) 98.4 F (36.9 C) (Oral)  Ht 5\' 2"  (1.575 m)  Wt 155 lb (70.308 kg)  BMI 28.34 kg/m2 Gen: NAD, alert, cooperative, and pleasant. CV: RRR, no murmur Resp: CTAB, no wheezes, non-labored Neuro: Alert and oriented, Speech clear, No gross deficits  Assessment and plan:  HYPERTENSION, BENIGN SYSTEMIC Reports good compliance with medication regimen. No complaints at this time. Requires refill on losartan. - Refill for losartan sent. - Follow-up in 6 months.  Rheumatoid arthritis Patient reporting good pain relief with current medication regimen with tramadol and Celebrex. Unfortunately patient reports that Celebrex has been increasingly expensive and difficult to afford. - As able to show patient additional resources on goodRx.com - Coupon for generic Celebrex printed as well as new prescription for Celebrex printed and given to patient. - Tramadol refilled.    Orders Placed This Encounter  Procedures  . Flu Vaccine QUAD 36+ mos IM    Meds ordered this encounter  Medications  . DISCONTD: traMADol (ULTRAM) 50 MG tablet    Sig: Take 50 mg by mouth every 6 (six) hours as needed.    Refill:  3  . traMADol (ULTRAM) 50 MG tablet    Sig: Take 1 tablet (50 mg total) by mouth every 6 (six) hours as needed.    Dispense:  60 tablet    Refill:  0  . losartan (COZAAR) 100 MG tablet    Sig: Take 1 tablet (100 mg total) by mouth  daily.    Dispense:  30 tablet    Refill:  2  . celecoxib (CELEBREX) 50 MG capsule    Sig: Take 1 capsule (50 mg total) by mouth 2 (two) times daily. As Needed.    Dispense:  180 capsule    Refill:  3     Elberta Leatherwood, MD,MS,  PGY2 05/12/2015 5:00 PM

## 2015-05-12 NOTE — Assessment & Plan Note (Signed)
Reports good compliance with medication regimen. No complaints at this time. Requires refill on losartan. - Refill for losartan sent. - Follow-up in 6 months.

## 2015-09-08 ENCOUNTER — Other Ambulatory Visit: Payer: Self-pay | Admitting: Family Medicine

## 2015-09-22 ENCOUNTER — Other Ambulatory Visit: Payer: Self-pay | Admitting: *Deleted

## 2015-09-22 MED ORDER — CILOSTAZOL 100 MG PO TABS: 100.0000 mg | ORAL_TABLET | Freq: Two times a day (BID) | ORAL | Status: DC

## 2015-10-11 ENCOUNTER — Other Ambulatory Visit: Payer: Self-pay | Admitting: *Deleted

## 2015-10-11 DIAGNOSIS — I10 Essential (primary) hypertension: Secondary | ICD-10-CM

## 2015-10-11 MED ORDER — BISOPROLOL FUMARATE 5 MG PO TABS: 5.0000 mg | ORAL_TABLET | Freq: Every day | ORAL | Status: DC

## 2015-11-07 ENCOUNTER — Encounter: Payer: Self-pay | Admitting: Family Medicine

## 2015-11-07 ENCOUNTER — Ambulatory Visit (INDEPENDENT_AMBULATORY_CARE_PROVIDER_SITE_OTHER): Payer: Medicare Other | Admitting: Family Medicine

## 2015-11-07 VITALS — BP 156/82 | HR 85 | Temp 98.3°F | Ht 62.0 in | Wt 157.0 lb

## 2015-11-07 DIAGNOSIS — M159 Polyosteoarthritis, unspecified: Secondary | ICD-10-CM

## 2015-11-07 DIAGNOSIS — Z79899 Other long term (current) drug therapy: Secondary | ICD-10-CM

## 2015-11-07 DIAGNOSIS — I10 Essential (primary) hypertension: Secondary | ICD-10-CM | POA: Diagnosis not present

## 2015-11-07 LAB — CBC
HEMATOCRIT: 39.8 % (ref 35.0–45.0)
Hemoglobin: 13.3 g/dL (ref 11.7–15.5)
MCH: 33.7 pg — ABNORMAL HIGH (ref 27.0–33.0)
MCHC: 33.4 g/dL (ref 32.0–36.0)
MCV: 100.8 fL — AB (ref 80.0–100.0)
MPV: 9.7 fL (ref 7.5–12.5)
Platelets: 213 10*3/uL (ref 140–400)
RBC: 3.95 MIL/uL (ref 3.80–5.10)
RDW: 13.1 % (ref 11.0–15.0)
WBC: 7.1 10*3/uL (ref 3.8–10.8)

## 2015-11-07 LAB — TSH: TSH: 0.91 mIU/L

## 2015-11-07 MED ORDER — HYDROCHLOROTHIAZIDE 25 MG PO TABS: 25.0000 mg | ORAL_TABLET | Freq: Every day | ORAL | Status: DC

## 2015-11-07 MED ORDER — TRAMADOL HCL 50 MG PO TABS: 50.0000 mg | ORAL_TABLET | Freq: Four times a day (QID) | ORAL | Status: DC | PRN

## 2015-11-07 NOTE — Assessment & Plan Note (Signed)
Stable/worse: Patient reports good compliance with all of her medications at this time.She denies any symptoms of hypertension or hypotension. Today in the clinic patient's blood pressure was initially 162/76. Repeat blood pressure showed 156/82. Patient states that she has been under a little bit more stress than she typically is due to some family issues. She also states that while in the waiting room she had been slightly aggravated by some of the other patients she was surrounded by. - No medical changes at this time. As I believe that patient's hypertension may be due to some added stress. - I have asked patient to take her blood pressure  At her local pharmacy. If her blood pressure  Remains above 150/80 and I have asked her to contact me and I will likely increase the dosing of some of her medications. - labs obtained today

## 2015-11-07 NOTE — Assessment & Plan Note (Signed)
Stable: Patient reports some continued pain in her hands and fingers. She states that her pain is well managed with the prescribed tramadol. Patient has not had a refill on this medication in multiple months She is asking for a refill today. - Refill for tramadol provided.

## 2015-11-07 NOTE — Progress Notes (Signed)
   HPI  CC: Blood pressure check and medication refill. Patient is here for blood pressure check. She states that she has been feeling well and denies any symptoms of hypotension. She states that she regularly takes her medications as prescribed. She states that she has occasionally taken her blood pressure outside of the clinic and they have been within normal range.Today her blood pressure is elevated to 162/76. Repeat blood pressure yielded apressure of 156/82.Patient states that she has had some abnormal increased stress  This morning due to some family issues.Patient is asymptomatic. Denies any headache, blurred vision, chest pain, shortness of breath, nausea, vomiting, diarrhea,diaphoresis, falls, or trauma.  Review of Systems   See HPI for ROS. All other systems reviewed and are negative.  CC, SH/smoking status, and VS noted  Objective: BP 156/82 mmHg  Pulse 85  Temp(Src) 98.3 F (36.8 C) (Oral)  Ht 5\' 2"  (1.575 m)  Wt 157 lb (71.215 kg)  BMI 28.71 kg/m2 Gen: NAD, alert, cooperative, and pleasant. CV: RRR, no murmur Resp: CTAB, no wheezes, non-labored Ext: No edema, warm, Some arthritic changes noted at the DIP joints bilaterally  Assessment and plan:  HYPERTENSION, BENIGN SYSTEMIC Stable/worse: Patient reports good compliance with all of her medications at this time.She denies any symptoms of hypertension or hypotension. Today in the clinic patient's blood pressure was initially 162/76. Repeat blood pressure showed 156/82. Patient states that she has been under a little bit more stress than she typically is due to some family issues. She also states that while in the waiting room she had been slightly aggravated by some of the other patients she was surrounded by. - No medical changes at this time. As I believe that patient's hypertension may be due to some added stress. - I have asked patient to take her blood pressure  At her local pharmacy. If her blood pressure  Remains  above 150/80 and I have asked her to contact me and I will likely increase the dosing of some of her medications. - labs obtained today  Osteoarthritis of multiple joints Stable: Patient reports some continued pain in her hands and fingers. She states that her pain is well managed with the prescribed tramadol. Patient has not had a refill on this medication in multiple months She is asking for a refill today. - Refill for tramadol provided.    Orders Placed This Encounter  Procedures  . BASIC METABOLIC PANEL WITH GFR  . Lipid panel  . CBC  . TSH    Meds ordered this encounter  Medications  . traMADol (ULTRAM) 50 MG tablet    Sig: Take 1 tablet (50 mg total) by mouth every 6 (six) hours as needed.    Dispense:  60 tablet    Refill:  0  . hydrochlorothiazide (HYDRODIURIL) 25 MG tablet    Sig: Take 1 tablet (25 mg total) by mouth daily.    Dispense:  90 tablet    Refill:  3     Elberta Leatherwood, MD,MS,  PGY2 11/07/2015 6:18 PM

## 2015-11-07 NOTE — Patient Instructions (Signed)
It was a pleasure seeing you today in our clinic. Today we discussed Your blood pressure and medications. Here is the treatment plan we have discussed and agreed upon together:   - today your blood pressure is relatively elevated. Because there has been some abnormal increased stress today I will not be increasing your medications at this time.  However, I would like for you to take your blood pressure sometime this week at your local pharmacy. If your blood pressure remains elevated (Above 150/80) please contact our office to inform me and my staff, as we may want to change the dosing of some of your medications. - I will mail you the results of today's labs.

## 2015-11-08 LAB — LIPID PANEL
Cholesterol: 158 mg/dL (ref 125–200)
HDL: 40 mg/dL — AB (ref >=46)
LDL CALC: 69 mg/dL (ref <130)
TRIGLYCERIDES: 243 mg/dL — AB (ref <150)
Total CHOL/HDL Ratio: 4 Ratio (ref <=5.0)
VLDL: 49 mg/dL — AB (ref <30)

## 2015-11-08 LAB — BASIC METABOLIC PANEL WITH GFR
BUN: 9 mg/dL (ref 7–25)
CHLORIDE: 102 mmol/L (ref 98–110)
CO2: 27 mmol/L (ref 20–31)
Calcium: 9.6 mg/dL (ref 8.6–10.4)
Creat: 0.74 mg/dL (ref 0.50–0.99)
GFR, EST NON AFRICAN AMERICAN: 83 mL/min (ref >=60)
GFR, Est African American: >89 mL/min (ref >=60)
Glucose, Bld: 77 mg/dL (ref 65–99)
Potassium: 3.5 mmol/L (ref 3.5–5.3)
SODIUM: 139 mmol/L (ref 135–146)

## 2015-11-10 ENCOUNTER — Encounter: Payer: Self-pay | Admitting: Family Medicine

## 2016-01-11 ENCOUNTER — Other Ambulatory Visit: Payer: Self-pay | Admitting: Family Medicine

## 2016-01-16 ENCOUNTER — Telehealth: Payer: Self-pay | Admitting: Family Medicine

## 2016-01-16 NOTE — Telephone Encounter (Signed)
Pt called and wanted to let the doctor know that she found her prescription for Tramadol over the weekend. jw

## 2016-01-25 ENCOUNTER — Other Ambulatory Visit: Payer: Self-pay | Admitting: Family Medicine

## 2016-02-22 ENCOUNTER — Ambulatory Visit (INDEPENDENT_AMBULATORY_CARE_PROVIDER_SITE_OTHER): Payer: Medicare Other | Admitting: Family Medicine

## 2016-02-22 ENCOUNTER — Encounter: Payer: Self-pay | Admitting: Family Medicine

## 2016-02-22 VITALS — BP 144/82 | Temp 98.7°F | Ht 62.0 in | Wt 148.0 lb

## 2016-02-22 DIAGNOSIS — Z79899 Other long term (current) drug therapy: Secondary | ICD-10-CM | POA: Diagnosis not present

## 2016-02-22 DIAGNOSIS — K219 Gastro-esophageal reflux disease without esophagitis: Secondary | ICD-10-CM

## 2016-02-22 DIAGNOSIS — J449 Chronic obstructive pulmonary disease, unspecified: Secondary | ICD-10-CM | POA: Diagnosis not present

## 2016-02-22 DIAGNOSIS — I10 Essential (primary) hypertension: Secondary | ICD-10-CM | POA: Diagnosis not present

## 2016-02-22 LAB — VITAMIN B12: VITAMIN B 12: 327 pg/mL (ref 200–1100)

## 2016-02-22 LAB — FOLATE: Folate: 10.5 ng/mL (ref >5.4)

## 2016-02-22 MED ORDER — PRAVASTATIN SODIUM 40 MG PO TABS: 40.0000 mg | ORAL_TABLET | Freq: Every day | ORAL | 3 refills | Status: DC

## 2016-02-22 MED ORDER — PANTOPRAZOLE SODIUM 40 MG PO TBEC: 40.0000 mg | DELAYED_RELEASE_TABLET | Freq: Every day | ORAL | 3 refills | Status: DC

## 2016-02-22 NOTE — Assessment & Plan Note (Signed)
Stable: Patient's hypertension is currently stable. Blood pressure of 144/82 is on the high end of normal for her age range. - Continue current medication regimen.

## 2016-02-22 NOTE — Assessment & Plan Note (Signed)
Stable:  Well-controlled at this time. No changes in medication regimen at this time.

## 2016-02-22 NOTE — Assessment & Plan Note (Signed)
Stable: Patient symptoms are very well controlled with current pantoprazole. Patient noticed significant recurrence of symptoms any days she does not take this medication. We discussed that this medication can cause a deficiency in vitamin B-12. Due to this risk and her recent macrocytic RBCs noted on her CBC I believe that obtaining a new value for this as well as a folate level would be appropriate. - Labs obtained for vitamin B-12 and folate - Continue PPI.

## 2016-02-22 NOTE — Progress Notes (Signed)
   HPI  CC: Medication refills Patient is here for medication refills. She states that she has been doing well. She endorses excellent compliance with her medications at this time. She denies any adverse side effects from any of these medications. She states that she is staying active. She denies any fatigue or weakness. No recent shortness of breath or chest pain. She denies headache.   She states that her COPD has been well managed. No worsening of symptoms recently.  We discussed patient's diet. She states that she has been eating a well-balanced diet and regularly eats yogurt because she is concerned she could develop osteoporosis. We discussed that her PPI can place her at greater risk for developing osteoporosis but she states that the day she does not take this medication she has noticed substantial recurrence of GERD-like symptoms and that she prefers to stay on it.  Review of Systems   See HPI for ROS. All other systems reviewed and are negative.  CC, SH/smoking status, and VS noted  Objective: BP (!) 144/82 (BP Location: Left Arm, Patient Position: Sitting)   Temp 98.7 F (37.1 C) (Oral)   Ht 5\' 2"  (1.575 m)   Wt 148 lb (67.1 kg)   BMI 27.07 kg/m  Gen: NAD, alert, cooperative, and pleasant. CV: RRR, no murmur Resp: CTAB, no wheezes, non-labored Ext: No edema, warm Neuro: Alert and oriented, Speech clear, No gross deficits  Assessment and plan:  HYPERTENSION, BENIGN SYSTEMIC Stable: Patient's hypertension is currently stable. Blood pressure of 144/82 is on the high end of normal for her age range. - Continue current medication regimen.  COPD (chronic obstructive pulmonary disease) Stable:  Well-controlled at this time. No changes in medication regimen at this time.  G E R D Stable: Patient symptoms are very well controlled with current pantoprazole. Patient noticed significant recurrence of symptoms any days she does not take this medication. We discussed that this  medication can cause a deficiency in vitamin B-12. Due to this risk and her recent macrocytic RBCs noted on her CBC I believe that obtaining a new value for this as well as a folate level would be appropriate. - Labs obtained for vitamin B-12 and folate - Continue PPI.   Orders Placed This Encounter  Procedures  . Vitamin B12  . Folate    Meds ordered this encounter  Medications  . pravastatin (PRAVACHOL) 40 MG tablet    Sig: Take 1 tablet (40 mg total) by mouth daily.    Dispense:  90 tablet    Refill:  3  . pantoprazole (PROTONIX) 40 MG tablet    Sig: Take 1 tablet (40 mg total) by mouth daily.    Dispense:  90 tablet    Refill:  3     Elberta Leatherwood, MD,MS,  PGY3 02/22/2016 2:10 PM

## 2016-02-22 NOTE — Patient Instructions (Signed)
It was a pleasure seeing you today in our clinic. Today we discussed your medications. Here is the treatment plan we have discussed and agreed upon together:   - Continue using your medications as prescribed. I'm very pleased with how your vital signs look today. - If you have any issues please do not hesitate to call our office. - Today I obtained some labs. If there is anything we need to address from these results I will contact you personally otherwise I will mail you these results within the next 2 weeks.

## 2016-02-23 ENCOUNTER — Encounter: Payer: Self-pay | Admitting: Family Medicine

## 2016-03-19 ENCOUNTER — Other Ambulatory Visit: Payer: Self-pay | Admitting: Family Medicine

## 2016-03-20 ENCOUNTER — Other Ambulatory Visit: Payer: Self-pay | Admitting: Family Medicine

## 2016-03-20 MED ORDER — TRAMADOL HCL 50 MG PO TABS: 50.0000 mg | ORAL_TABLET | Freq: Four times a day (QID) | ORAL | 0 refills | Status: DC | PRN

## 2016-03-20 NOTE — Telephone Encounter (Signed)
yes

## 2016-03-21 NOTE — Telephone Encounter (Signed)
Patient is aware that script is ready for pick up. [name] Page,CMA  

## 2016-07-11 ENCOUNTER — Other Ambulatory Visit: Payer: Self-pay | Admitting: Family Medicine

## 2016-07-24 ENCOUNTER — Other Ambulatory Visit: Payer: Self-pay | Admitting: Family Medicine

## 2016-07-26 ENCOUNTER — Ambulatory Visit (INDEPENDENT_AMBULATORY_CARE_PROVIDER_SITE_OTHER): Payer: Medicare Other | Admitting: *Deleted

## 2016-07-26 ENCOUNTER — Encounter: Payer: Self-pay | Admitting: *Deleted

## 2016-07-26 VITALS — BP 138/68 | HR 68 | Temp 98.1°F | Ht 62.5 in | Wt 156.0 lb

## 2016-07-26 DIAGNOSIS — Z0000 Encounter for general adult medical examination without abnormal findings: Secondary | ICD-10-CM | POA: Diagnosis not present

## 2016-07-26 DIAGNOSIS — Z23 Encounter for immunization: Secondary | ICD-10-CM

## 2016-07-26 NOTE — Patient Instructions (Addendum)
Fall Prevention in the Home Introduction Falls can cause injuries. They can happen to people of all ages. There are many things you can do to make your home safe and to help prevent falls. What can I do on the outside of my home?  Regularly fix the edges of walkways and driveways and fix any cracks.  Remove anything that might make you trip as you walk through a door, such as a raised step or threshold.  Trim any bushes or trees on the path to your home.  Use bright outdoor lighting.  Clear any walking paths of anything that might make someone trip, such as rocks or tools.  Regularly check to see if handrails are loose or broken. Make sure that both sides of any steps have handrails.  Any raised decks and porches should have guardrails on the edges.  Have any leaves, snow, or ice cleared regularly.  Use sand or salt on walking paths during winter.  Clean up any spills in your garage right away. This includes oil or grease spills. What can I do in the bathroom?  Use night lights.  Install grab bars by the toilet and in the tub and shower. Do not use towel bars as grab bars.  Use non-skid mats or decals in the tub or shower.  If you need to sit down in the shower, use a plastic, non-slip stool.  Keep the floor dry. Clean up any water that spills on the floor as soon as it happens.  Remove soap buildup in the tub or shower regularly.  Attach bath mats securely with double-sided non-slip rug tape.  Do not have throw rugs and other things on the floor that can make you trip. What can I do in the bedroom?  Use night lights.  Make sure that you have a light by your bed that is easy to reach.  Do not use any sheets or blankets that are too big for your bed. They should not hang down onto the floor.  Have a firm chair that has side arms. You can use this for support while you get dressed.  Do not have throw rugs and other things on the floor that can make you trip. What  can I do in the kitchen?  Clean up any spills right away.  Avoid walking on wet floors.  Keep items that you use a lot in easy-to-reach places.  If you need to reach something above you, use a strong step stool that has a grab bar.  Keep electrical cords out of the way.  Do not use floor polish or wax that makes floors slippery. If you must use wax, use non-skid floor wax.  Do not have throw rugs and other things on the floor that can make you trip. What can I do with my stairs?  Do not leave any items on the stairs.  Make sure that there are handrails on both sides of the stairs and use them. Fix handrails that are broken or loose. Make sure that handrails are as long as the stairways.  Check any carpeting to make sure that it is firmly attached to the stairs. Fix any carpet that is loose or worn.  Avoid having throw rugs at the top or bottom of the stairs. If you do have throw rugs, attach them to the floor with carpet tape.  Make sure that you have a light switch at the top of the stairs and the bottom of the stairs. If  you do not have them, ask someone to add them for you. What else can I do to help prevent falls?  Wear shoes that:  Do not have high heels.  Have rubber bottoms.  Are comfortable and fit you well.  Are closed at the toe. Do not wear sandals.  If you use a stepladder:  Make sure that it is fully opened. Do not climb a closed stepladder.  Make sure that both sides of the stepladder are locked into place.  Ask someone to hold it for you, if possible.  Clearly mark and make sure that you can see:  Any grab bars or handrails.  First and last steps.  Where the edge of each step is.  Use tools that help you move around (mobility aids) if they are needed. These include:  Canes.  Walkers.  Scooters.  Crutches.  Turn on the lights when you go into a dark area. Replace any light bulbs as soon as they burn out.  Set up your furniture so you  have a clear path. Avoid moving your furniture around.  If any of your floors are uneven, fix them.  If there are any pets around you, be aware of where they are.  Review your medicines with your doctor. Some medicines can make you feel dizzy. This can increase your chance of falling. Ask your doctor what other things that you can do to help prevent falls. This information is not intended to replace advice given to you by your health care provider. Make sure you discuss any questions you have with your health care provider. Document Released: 05/05/2009 Document Revised: 12/15/2015 Document Reviewed: 08/13/2014  2017 [employer]  Health Maintenance, Female Introduction Adopting a healthy lifestyle and getting preventive care can go a long way to promote health and wellness. Talk with your health care provider about what schedule of regular examinations is right for you. This is a good chance for you to check in with your provider about disease prevention and staying healthy. In between checkups, there are plenty of things you can do on your own. Experts have done a lot of research about which lifestyle changes and preventive measures are most likely to keep you healthy. Ask your health care provider for more information. Weight and diet Eat a healthy diet  Be sure to include plenty of vegetables, fruits, low-fat dairy products, and lean protein.  Do not eat a lot of foods high in solid fats, added sugars, or salt.  Get regular exercise. This is one of the most important things you can do for your health.  Most adults should exercise for at least 150 minutes each week. The exercise should increase your heart rate and make you sweat (moderate-intensity exercise).  Most adults should also do strengthening exercises at least twice a week. This is in addition to the moderate-intensity exercise. Maintain a healthy weight  Body mass index (BMI) is a measurement that can be used to identify  possible weight problems. It estimates body fat based on height and weight. Your health care provider can help determine your BMI and help you achieve or maintain a healthy weight.  For females 21 years of age and older:  A BMI below 18.5 is considered underweight.  A BMI of 18.5 to 24.9 is normal.  A BMI of 25 to 29.9 is considered overweight.  A BMI of 30 and above is considered obese. Watch levels of cholesterol and blood lipids  You should start having your blood tested  for lipids and cholesterol at 71 years of age, then have this test every 5 years.  You may need to have your cholesterol levels checked more often if:  Your lipid or cholesterol levels are high.  You are older than 71 years of age.  You are at high risk for heart disease. Cancer screening Lung Cancer  Lung cancer screening is recommended for adults 25-63 years old who are at high risk for lung cancer because of a history of smoking.  A yearly low-dose CT scan of the lungs is recommended for people who:  Currently smoke.  Have quit within the past 15 years.  Have at least a 30-pack-year history of smoking. A pack year is smoking an average of one pack of cigarettes a day for 1 year.  Yearly screening should continue until it has been 15 years since you quit.  Yearly screening should stop if you develop a health problem that would prevent you from having lung cancer treatment. Breast Cancer  Practice breast self-awareness. This means understanding how your breasts normally appear and feel.  It also means doing regular breast self-exams. Let your health care provider know about any changes, no matter how small.  If you are in your 20s or 30s, you should have a clinical breast exam (CBE) by a health care provider every 1-3 years as part of a regular health exam.  If you are 10 or older, have a CBE every year. Also consider having a breast X-ray (mammogram) every year.  If you have a family history of  breast cancer, talk to your health care provider about genetic screening.  If you are at high risk for breast cancer, talk to your health care provider about having an MRI and a mammogram every year.  Breast cancer gene (BRCA) assessment is recommended for women who have family members with BRCA-related cancers. BRCA-related cancers include:  Breast.  Ovarian.  Tubal.  Peritoneal cancers.  Results of the assessment will determine the need for genetic counseling and BRCA1 and BRCA2 testing. Cervical Cancer  Your health care provider may recommend that you be screened regularly for cancer of the pelvic organs (ovaries, uterus, and vagina). This screening involves a pelvic examination, including checking for microscopic changes to the surface of your cervix (Pap test). You may be encouraged to have this screening done every 3 years, beginning at age 45.  For women ages 73-65, health care providers may recommend pelvic exams and Pap testing every 3 years, or they may recommend the Pap and pelvic exam, combined with testing for human papilloma virus (HPV), every 5 years. Some types of HPV increase your risk of cervical cancer. Testing for HPV may also be done on women of any age with unclear Pap test results.  Other health care providers may not recommend any screening for nonpregnant women who are considered low risk for pelvic cancer and who do not have symptoms. Ask your health care provider if a screening pelvic exam is right for you.  If you have had past treatment for cervical cancer or a condition that could lead to cancer, you need Pap tests and screening for cancer for at least 20 years after your treatment. If Pap tests have been discontinued, your risk factors (such as having a new sexual partner) need to be reassessed to determine if screening should resume. Some women have medical problems that increase the chance of getting cervical cancer. In these cases, your health care provider may  recommend more frequent  screening and Pap tests. Colorectal Cancer  This type of cancer can be detected and often prevented.  Routine colorectal cancer screening usually begins at 71 years of age and continues through 71 years of age.  Your health care provider may recommend screening at an earlier age if you have risk factors for colon cancer.  Your health care provider may also recommend using home test kits to check for hidden blood in the stool.  A small camera at the end of a tube can be used to examine your colon directly (sigmoidoscopy or colonoscopy). This is done to check for the earliest forms of colorectal cancer.  Routine screening usually begins at age 50.  Direct examination of the colon should be repeated every 5-10 years through 71 years of age. However, you may need to be screened more often if early forms of precancerous polyps or small growths are found. Skin Cancer  Check your skin from head to toe regularly.  Tell your health care provider about any new moles or changes in moles, especially if there is a change in a mole's shape or color.  Also tell your health care provider if you have a mole that is larger than the size of a pencil eraser.  Always use sunscreen. Apply sunscreen liberally and repeatedly throughout the day.  Protect yourself by wearing long sleeves, pants, a wide-brimmed hat, and sunglasses whenever you are outside. Heart disease, diabetes, and high blood pressure  High blood pressure causes heart disease and increases the risk of stroke. High blood pressure is more likely to develop in:  People who have blood pressure in the high end of the normal range (130-139/85-89 mm Hg).  People who are overweight or obese.  People who are African American.  If you are 18-39 years of age, have your blood pressure checked every 3-5 years. If you are 40 years of age or older, have your blood pressure checked every year. You should have your blood pressure  measured twice-once when you are at a hospital or clinic, and once when you are not at a hospital or clinic. Record the average of the two measurements. To check your blood pressure when you are not at a hospital or clinic, you can use:  An automated blood pressure machine at a pharmacy.  A home blood pressure monitor.  If you are between 55 years and 79 years old, ask your health care provider if you should take aspirin to prevent strokes.  Have regular diabetes screenings. This involves taking a blood sample to check your fasting blood sugar level.  If you are at a normal weight and have a low risk for diabetes, have this test once every three years after 71 years of age.  If you are overweight and have a high risk for diabetes, consider being tested at a younger age or more often. Preventing infection Hepatitis B  If you have a higher risk for hepatitis B, you should be screened for this virus. You are considered at high risk for hepatitis B if:  You were born in a country where hepatitis B is common. Ask your health care provider which countries are considered high risk.  Your parents were born in a high-risk country, and you have not been immunized against hepatitis B (hepatitis B vaccine).  You have HIV or AIDS.  You use needles to inject street drugs.  You live with someone who has hepatitis B.  You have had sex with someone who has hepatitis B.    You get hemodialysis treatment.  You take certain medicines for conditions, including cancer, organ transplantation, and autoimmune conditions. Hepatitis C  Blood testing is recommended for:  Everyone born from [birth year] through 1965.  Anyone with known risk factors for hepatitis C. Sexually transmitted infections (STIs)  You should be screened for sexually transmitted infections (STIs) including gonorrhea and chlamydia if:  You are sexually active and are younger than 71 years of age.  You are older than 71 years of age and  your health care provider tells you that you are at risk for this type of infection.  Your sexual activity has changed since you were last screened and you are at an increased risk for chlamydia or gonorrhea. Ask your health care provider if you are at risk.  If you do not have HIV, but are at risk, it may be recommended that you take a prescription medicine daily to prevent HIV infection. This is called pre-exposure prophylaxis (PrEP). You are considered at risk if:  You are sexually active and do not regularly use condoms or know the HIV status of your partner(s).  You take drugs by injection.  You are sexually active with a partner who has HIV. Talk with your health care provider about whether you are at high risk of being infected with HIV. If you choose to begin PrEP, you should first be tested for HIV. You should then be tested every 3 months for as long as you are taking PrEP. Pregnancy  If you are premenopausal and you may become pregnant, ask your health care provider about preconception counseling.  If you may become pregnant, take 400 to 800 micrograms (mcg) of folic acid every day.  If you want to prevent pregnancy, talk to your health care provider about birth control (contraception). Osteoporosis and menopause  Osteoporosis is a disease in which the bones lose minerals and strength with aging. This can result in serious bone fractures. Your risk for osteoporosis can be identified using a bone density scan.  If you are 5 years of age or older, or if you are at risk for osteoporosis and fractures, ask your health care provider if you should be screened.  Ask your health care provider whether you should take a calcium or vitamin D supplement to lower your risk for osteoporosis.  Menopause may have certain physical symptoms and risks.  Hormone replacement therapy may reduce some of these symptoms and risks. Talk to your health care provider about whether hormone replacement  therapy is right for you. Follow these instructions at home:  Schedule regular health, dental, and eye exams.  Stay current with your immunizations.  Do not use any tobacco products including cigarettes, chewing tobacco, or electronic cigarettes.  If you are pregnant, do not drink alcohol.  If you are breastfeeding, limit how much and how often you drink alcohol.  Limit alcohol intake to no more than 1 drink per day for nonpregnant women. One drink equals 12 ounces of beer, 5 ounces of wine, or 1 ounces of hard liquor.  Do not use street drugs.  Do not share needles.  Ask your health care provider for help if you need support or information about quitting drugs.  Tell your health care provider if you often feel depressed.  Tell your health care provider if you have ever been abused or do not feel safe at home. This information is not intended to replace advice given to you by your health care provider. Make sure you discuss any  questions you have with your health care provider. Document Released: 01/22/2011 Document Revised: 12/15/2015 Document Reviewed: 04/12/2015  2017 [employer]vier   Steps to Quit Smoking Smoking tobacco can be bad for your health. It can also affect almost every organ in your body. Smoking puts you and people around you at risk for many serious long-lasting (chronic) diseases. Quitting smoking is hard, but it is one of the best things that you can do for your health. It is never too late to quit. What are the benefits of quitting smoking? When you quit smoking, you lower your risk for getting serious diseases and conditions. They can include:  Lung cancer or lung disease.  Heart disease.  Stroke.  Heart attack.  Not being able to have children (infertility).  Weak bones (osteoporosis) and broken bones (fractures). If you have coughing, wheezing, and shortness of breath, those symptoms may get better when you quit. You may also get sick less often. If you  are pregnant, quitting smoking can help to lower your chances of having a baby of low birth weight. What can I do to help me quit smoking? Talk with your doctor about what can help you quit smoking. Some things you can do (strategies) include:  Quitting smoking totally, instead of slowly cutting back how much you smoke over a period of time.  Going to in-person counseling. You are more likely to quit if you go to many counseling sessions.  Using resources and support systems, such as:  Online chats with a Social worker.  Phone quitlines.  Printed Furniture conservator/restorer.  Support groups or group counseling.  Text messaging programs.  Mobile phone apps or applications.  Taking medicines. Some of these medicines may have nicotine in them. If you are pregnant or breastfeeding, do not take any medicines to quit smoking unless your doctor says it is okay. Talk with your doctor about counseling or other things that can help you. Talk with your doctor about using more than one strategy at the same time, such as taking medicines while you are also going to in-person counseling. This can help make quitting easier. What things can I do to make it easier to quit? Quitting smoking might feel very hard at first, but there is a lot that you can do to make it easier. Take these steps:  Talk to your family and friends. Ask them to support and encourage you.  Call phone quitlines, reach out to support groups, or work with a Social worker.  Ask people who smoke to not smoke around you.  Avoid places that make you want (trigger) to smoke, such as:  Bars.  Parties.  Smoke-break areas at work.  Spend time with people who do not smoke.  Lower the stress in your life. Stress can make you want to smoke. Try these things to help your stress:  Getting regular exercise.  Deep-breathing exercises.  Yoga.  Meditating.  Doing a body scan. To do this, close your eyes, focus on one area of your body at a  time from head to toe, and notice which parts of your body are tense. Try to relax the muscles in those areas.  Download or buy apps on your mobile phone or tablet that can help you stick to your quit plan. There are many free apps, such as QuitGuide from the [employer] manager for Disease Control and Prevention). You can find more support from smokefree.gov and other websites. This information is not intended to replace advice given to you by your  health care provider. Make sure you discuss any questions you have with your health care provider. Document Released: 05/05/2009 Document Revised: 03/06/2016 Document Reviewed: 11/23/2014 Elsevier Interactive Patient Education  2017 [employer].

## 2016-07-26 NOTE — Progress Notes (Signed)
Subjective:   [name] Page is a 71 y.o. female who presents for an Initial Medicare Annual Wellness Visit.  Cardiac Risk Factors include: advanced age (>26men, >69 women);dyslipidemia;hypertension;smoking/ tobacco exposure     Objective:    Today's Vitals   07/26/16 1341  BP: 138/68  Pulse: 68  Temp: 98.1 F (36.7 C)  TempSrc: Oral  SpO2: 96%  Weight: 156 lb (70.8 kg)  Height: 5' 2.5" (1.588 m)  PainSc: 0-No pain   Body mass index is 28.08 kg/m.   Current Medications (verified) Outpatient Encounter Prescriptions as of 07/26/2016  Medication Sig  . albuterol (PROVENTIL HFA;VENTOLIN HFA) 108 (90 BASE) MCG/ACT inhaler Inhale 2 puffs into the lungs every 6 (six) hours as needed for wheezing.  Marland Kitchen aluminum-magnesium hydroxide-simethicone (MAALOX) [id number] MG/5ML SUSP Take 15 mLs by mouth 4 (four) times daily -  before meals and at bedtime.  Marland Kitchen aspirin 325 MG tablet Take 1 tablet (325 mg total) by mouth daily.  . bisoprolol (ZEBETA) 5 MG tablet Take 1 tablet (5 mg total) by mouth daily.  . celecoxib (CELEBREX) 50 MG capsule TAKE ONE CAPSULE BY MOUTH TWICE DAILY AS NEEDED  . cilostazol (PLETAL) 100 MG tablet Take 1 tablet (100 mg total) by mouth 2 (two) times daily.  . hydrochlorothiazide (HYDRODIURIL) 25 MG tablet Take 1 tablet (25 mg total) by mouth daily.  . hydrOXYzine (ATARAX/VISTARIL) 25 MG tablet Take 1 tablet (25 mg total) by mouth 3 (three) times daily as needed.  Marland Kitchen losartan (COZAAR) 100 MG tablet TAKE 1 TABLET BY MOUTH DAILY  . Multiple Vitamins-Minerals (MULTIVITAMIN WITH MINERALS) tablet Take 1 tablet by mouth daily.    . pantoprazole (PROTONIX) 40 MG tablet Take 1 tablet (40 mg total) by mouth daily.  . pravastatin (PRAVACHOL) 40 MG tablet Take 1 tablet (40 mg total) by mouth daily.  . traMADol (ULTRAM) 50 MG tablet Take 1 tablet (50 mg total) by mouth every 6 (six) hours as needed.  . famotidine (PEPCID) 20 MG tablet One at bedtime   No facility-administered  encounter medications on file as of 07/26/2016.     Allergies (verified) Lisinopril and Penicillins   History: Past Medical History:  Diagnosis Date  . [place] tarry stools    from iron pills  . CHF (congestive heart failure) ([place]) 1997  . Colon polyps 2005  . COPD (chronic obstructive pulmonary disease) ([place])   . GERD (gastroesophageal reflux disease)   . Heart attack 1997 and 2001  . Hyperlipidemia   . Hypertension   . Pneumonia 2012  . Status post dilation of esophageal narrowing    Past Surgical History:  Procedure Laterality Date  . COLONOSCOPY    . CORONARY ANGIOPLASTY WITH STENT PLACEMENT  2002  . POLYPECTOMY     Family History  Problem Relation Age of Onset  . Heart disease Father   . Colon cancer Mother 59    EARLY 70'S  . Cancer Mother     colon  . Clotting disorder Brother   . Diabetes Sister    Social History   Occupational History  . Retired    Social History Main Topics  . Smoking status: Current Every Day Smoker    Packs/day: 0.40    Years: 40.00    Types: Cigarettes  . Smokeless tobacco: Never Used  . Alcohol use No  . Drug use: No  . Sexual activity: No    Tobacco Counseling Ready to quit: Yes Counseling given: Not Answered Discussed smoking cessation  and gave information on 1-800-QUIT-NOW  Activities of Daily Living In your present state of health, do you have any difficulty performing the following activities: 07/26/2016  Hearing? N  Vision? N  Difficulty concentrating or making decisions? N  Walking or climbing stairs? N  Dressing or bathing? N  Doing errands, shopping? N  Preparing Food and eating ? N  Using the Toilet? N  In the past six months, have you accidently leaked urine? N  Do you have problems with loss of bowel control? N  Managing your Medications? N  Managing your Finances? N  Housekeeping or managing your Housekeeping? N  Some recent data might be hidden  Home Safety:  My home has a working smoke alarm:  Yes X 1  on second floor. Advised installing smoke alarm on first floor as well           My home throw rugs have been fastened down to the floor or removed:  Removed I have non-slip mats in the bathtub and shower:  Yes         All my home's stairs have railings or bannisters: Two level home with no outside stairs. Has bannister to second floor           My home's floors, stairs and hallways are free from clutter, wires and cords:  Yes     I wear seatbelts consistently:  Yes    Immunizations and Health Maintenance Immunization History  Administered Date(s) Administered  . Influenza Split 05/23/2011  . Influenza,inj,Quad PF,36+ Mos 05/18/2013, 05/17/2014, 05/12/2015  . Pneumococcal Conjugate-13 12/08/2014  . Pneumococcal Polysaccharide-23 05/23/2011  . Td 11/20/2005   Health Maintenance Due  Topic Date Due  . Hepatitis C Screening  02-08-1946  . TETANUS/TDAP  11/21/2015  . INFLUENZA VACCINE  02/21/2016  Flu vaccine administered today Patient will obtain TDaP at local pharmacy Patient will hold off on Hep C screen till it can be drawn in-house [facility] Medicare)  Patient Care Team: Elberta Leatherwood, MD as PCP - General (Family Medicine) Fulton Reek, MD as Attending Physician (Cardiology) [facility] Dermatology  Indicate any recent Medical Services you may have received from other than [facility] providers in the past year (date may be approximate).     Assessment:   This is a routine wellness examination for [name] Page.   Hearing/Vision screen  Hearing Screening   Method: Audiometry   125Hz  250Hz  500Hz  1000Hz  2000Hz  3000Hz  4000Hz  6000Hz  8000Hz   Right ear:   40 40 40  40    Left ear:   40 40 40  40      Dietary issues and exercise activities discussed: Current Exercise Habits: Home exercise routine, Type of exercise: walking, Time (Minutes): 15, Frequency (Times/Week): 3, Weekly Exercise (Minutes/Week): 45, Intensity: Moderate, Exercise limited by: None identified  Goals    . Blood Pressure <  140/90    . Quit smoking / using tobacco      Depression Screen PHQ 2/9 Scores 07/26/2016 11/07/2015 05/12/2015 05/12/2015 01/21/2015 12/08/2014 10/13/2014  PHQ - 2 Score 0 0 0 0 0 0 0    Fall Risk Fall Risk  07/26/2016 11/07/2015 05/12/2015 10/13/2014 04/28/2013  Falls in the past year? No No No No No   TUG Test:  Done in 8 seconds.   Cognitive Function: Mini-Cog  Passed with score 4/5  Screening Tests Health Maintenance  Topic Date Due  . Hepatitis C Screening  1945-12-01  . TETANUS/TDAP  11/21/2015  . INFLUENZA VACCINE  02/21/2016  .  MAMMOGRAM  10/19/2016  . COLONOSCOPY  12/16/2017  . DEXA SCAN  Completed  . ZOSTAVAX  Addressed  . PNA vac Low Risk Adult  Completed      Plan:   Patient will f/u with PCP for scalp dermatitis and left ear dryness with occasional pain  During the course of the visit, Anahita was educated and counseled about the following appropriate screening and preventive services:   Vaccines to include Pneumoccal, Influenza, Td, Zostavax  Cardiovascular disease screening  Colorectal cancer screening  Bone density screening  Diabetes screening  Mammography/PAP  Nutrition counseling  Smoking cessation counseling  Patient Instructions (the written plan) were given to the patient.    Velora Heckler, RN   07/26/2016

## 2016-07-31 ENCOUNTER — Encounter: Payer: Self-pay | Admitting: *Deleted

## 2016-07-31 NOTE — Progress Notes (Signed)
I have reviewed this visit and discussed with Howell Rucks, RN, BSN, and agree with her documentation.   Elberta Leatherwood, MD,MS,  PGY3 07/31/2016 2:42 PM

## 2016-08-29 ENCOUNTER — Encounter: Payer: Self-pay | Admitting: Family Medicine

## 2016-08-29 ENCOUNTER — Ambulatory Visit (INDEPENDENT_AMBULATORY_CARE_PROVIDER_SITE_OTHER): Payer: Medicare Other | Admitting: Family Medicine

## 2016-08-29 DIAGNOSIS — B350 Tinea barbae and tinea capitis: Secondary | ICD-10-CM | POA: Insufficient documentation

## 2016-08-29 DIAGNOSIS — I10 Essential (primary) hypertension: Secondary | ICD-10-CM | POA: Diagnosis not present

## 2016-08-29 MED ORDER — CILOSTAZOL 100 MG PO TABS: 100.0000 mg | ORAL_TABLET | Freq: Two times a day (BID) | ORAL | 3 refills | Status: DC

## 2016-08-29 MED ORDER — CELECOXIB 50 MG PO CAPS: 50.0000 mg | ORAL_CAPSULE | Freq: Two times a day (BID) | ORAL | 0 refills | Status: DC | PRN

## 2016-08-29 MED ORDER — BISOPROLOL FUMARATE 5 MG PO TABS: 5.0000 mg | ORAL_TABLET | Freq: Every day | ORAL | 11 refills | Status: DC

## 2016-08-29 MED ORDER — GRISEOFULVIN MICROSIZE 500 MG PO TABS: 500.0000 mg | ORAL_TABLET | Freq: Every day | ORAL | 0 refills | Status: DC

## 2016-08-29 NOTE — Assessment & Plan Note (Signed)
Patient is here with signs and symptoms consistent with tinea capitis. Will treat. - Griseofulvin 500 mg daily 4 weeks - I have asked patient to follow-up in 3-1/2 weeks. At that time we will assess her progress. If there is not sufficient progress at that time will consider writing an additional prescription for 2 more weeks of therapy.

## 2016-08-29 NOTE — Patient Instructions (Signed)
It was a pleasure seeing you today in our clinic. Today we discussed your hair loss and scalp dryness. Here is the treatment plan we have discussed and agreed upon together:   - I prescribed you Griseofulvin. This is an antifungal medication. Take 1 tablet every day for the next 4 weeks. If you notice any rash, shortness of breath, chest pain, or malaise stopped taking this medication and call our office or go to the nearest emergency department. - I would like to see you back in a little less than 4 weeks. At that time I would like to reevaluate your response and whether or not we have to continue this medication for an additional 2 weeks.

## 2016-08-29 NOTE — Progress Notes (Signed)
   HPI  CC: scalp dryness Been going on for about a year. Having some bald spots appear. No new soaps or detergents. Located only to scalp. Denies dandruff. No skin rashes. Has not tried any medications or new shampoos. No knowledge of insect or new pet exposure. No recent travel. No systemic symptoms.  ROS: Denies headache, blurred vision, hearing changes, new bruising, body rashes, chest pain, shortness of breath, nausea, vomiting, diarrhea, fever, chills.  CC, SH/smoking status, and VS noted  Objective: BP 132/60   Pulse 83   Temp 98.4 F (36.9 C) (Oral)   Ht 5' 2.5" (1.588 m)   Wt 153 lb 9.6 oz (69.7 kg)   SpO2 96%   BMI 27.65 kg/m  Gen: NAD, alert, cooperative, and pleasant. HEENT: NCAT, EOMI, PERRL, TMs clear but auditory canal dry and flaky. Scalp notably dry, with areas of hypopigmentation and clearing of hair. One patch of dark spots noted in an area of hair loss. CV: RRR, no murmur Resp: CTAB, no wheezes, non-labored  Assessment and plan:  Tinea capitis Patient is here with signs and symptoms consistent with tinea capitis. Will treat. - Griseofulvin 500 mg daily 4 weeks - I have asked patient to follow-up in 3-1/2 weeks. At that time we will assess her progress. If there is not sufficient progress at that time will consider writing an additional prescription for 2 more weeks of therapy.   Meds ordered this encounter  Medications  . bisoprolol (ZEBETA) 5 MG tablet    Sig: Take 1 tablet (5 mg total) by mouth daily.    Dispense:  30 tablet    Refill:  11  . celecoxib (CELEBREX) 50 MG capsule    Sig: Take 1 capsule (50 mg total) by mouth 2 (two) times daily as needed.    Dispense:  180 capsule    Refill:  0  . cilostazol (PLETAL) 100 MG tablet    Sig: Take 1 tablet (100 mg total) by mouth 2 (two) times daily.    Dispense:  180 tablet    Refill:  3  . griseofulvin (GRIFULVIN V) 500 MG tablet    Sig: Take 1 tablet (500 mg total) by mouth daily.    Dispense:  28  tablet    Refill:  0     Elberta Leatherwood, MD,MS,  PGY3 08/29/2016 5:54 PM

## 2016-09-21 ENCOUNTER — Encounter: Payer: Self-pay | Admitting: Family Medicine

## 2016-09-21 ENCOUNTER — Ambulatory Visit (INDEPENDENT_AMBULATORY_CARE_PROVIDER_SITE_OTHER): Payer: Medicare Other | Admitting: Family Medicine

## 2016-09-21 DIAGNOSIS — B350 Tinea barbae and tinea capitis: Secondary | ICD-10-CM | POA: Diagnosis not present

## 2016-09-21 MED ORDER — GRISEOFULVIN MICROSIZE 500 MG PO TABS: 500.0000 mg | ORAL_TABLET | Freq: Every day | ORAL | 0 refills | Status: DC

## 2016-09-21 NOTE — Assessment & Plan Note (Signed)
Patient is here for follow-up on her tinea capitis. No significant side effects with medication regimen. Some notable scalp tenderness that began last week. This is likely a sign of gradual improving symptoms, rather than anything concerning. Scalp dryness has improved dramatically and there is no evidence of expanding areas of alopecia. - Continue griseofulvin for an additional 2 weeks (6 weeks total) - Patient has been using some Selsun Blue with showers, informed patient that she could continue this if desired. - Over-the-counter antifungal cream if patient feels necessary, as it may help soothe the scalp (but informed her that this was not a necessity) - Follow-up in 4-8 weeks to ensure improving Sxs.

## 2016-09-21 NOTE — Progress Notes (Signed)
   HPI  CC: Tinea capitis, follow-up Patient is here for follow-up on her tinea capitis. She states that she is been doing relatively well on the prescribed medication, Griseofulvin. She believes that she has not had any worsening in her symptoms, but does not know if she has had much improvement. She endorses no significant side effects but states that over the past week or so she has had some increased sensitivity of her scalp. She states that with this sensitivity she has some discomfort when combing her hair. She is wondering whether or not this is a sign to be concerned. Otherwise she has been doing well. No fevers, chills, nausea, vomiting, diarrhea, vision changes, hearing changes, lightheadedness, dizziness, numbness, weakness, or paresthesias.  Review of Systems See HPI for ROS.   CC, SH/smoking status, and VS noted  Objective: BP 134/62   Pulse 83   Temp 98.4 F (36.9 C) (Oral)   Ht 5' 2.5" (1.588 m)   Wt 157 lb 3.2 oz (71.3 kg)   SpO2 96%   BMI 28.29 kg/m  Gen: NAD, alert, cooperative, and pleasant. HEENT: NCAT, EOMI, PERRL, significant improvement with scalp dryness/flakiness, areas of hypopigmentation with alopecia still present without evidence of worsening. No new dark spots present. CV: RRR, no murmur Resp: CTAB Neuro: Alert and oriented, Speech clear, No gross deficits   Assessment and plan:  Tinea capitis Patient is here for follow-up on her tinea capitis. No significant side effects with medication regimen. Some notable scalp tenderness that began last week. This is likely a sign of gradual improving symptoms, rather than anything concerning. Scalp dryness has improved dramatically and there is no evidence of expanding areas of alopecia. - Continue griseofulvin for an additional 2 weeks (6 weeks total) - Patient has been using some Selsun Blue with showers, informed patient that she could continue this if desired. - Over-the-counter antifungal cream if patient  feels necessary, as it may help soothe the scalp (but informed her that this was not a necessity) - Follow-up in 4-8 weeks to ensure improving Sxs.   Meds ordered this encounter  Medications  . griseofulvin (GRIFULVIN V) 500 MG tablet    Sig: Take 1 tablet (500 mg total) by mouth daily.    Dispense:  14 tablet    Refill:  0     Elberta Leatherwood, MD,MS,  PGY3 09/21/2016 2:56 PM

## 2016-09-21 NOTE — Patient Instructions (Signed)
It was a pleasure seeing you today in our clinic. Today we discussed your scalp issues. Here is the treatment plan we have discussed and agreed upon together:   - I have placed an order for an additional 2 weeks of the Griseofulvin. As before, take 1 tablet a day until the bottle is empty. - I would continue using the [facility] when bathing. - If you continue to have the slight discomfort in your scalp you may want to pick up some antifungal cream from your local pharmacy, "Lamisil". Apply this once or twice a day as needed. - I would like to see you back in one month.

## 2016-10-22 ENCOUNTER — Encounter: Payer: Self-pay | Admitting: Family Medicine

## 2016-10-22 ENCOUNTER — Ambulatory Visit (INDEPENDENT_AMBULATORY_CARE_PROVIDER_SITE_OTHER): Payer: Medicare Other | Admitting: Family Medicine

## 2016-10-22 DIAGNOSIS — B350 Tinea barbae and tinea capitis: Secondary | ICD-10-CM | POA: Diagnosis not present

## 2016-10-22 NOTE — Progress Notes (Signed)
   HPI  CC: Tinea capitis Patient is here to follow-up on her tinea capitis. She states that she was able to complete her full course of Grifulvin. She has not had any known adverse side effects this time. She overall feels well. She states that the tenderness of her scalp has diminished since our last visit. She is no longer using the [facility] but has tried a different brand of dandruff specific shampoo. She is also applying over-the-counter antifungal cream twice a day. She states that now that her scalp tenderness has decreased she is able to comb her hair. She states that initially she was noticing a significant amount of air loss when she combed her hair but this has since improved. She does not know if she has had any improvement but she states that she feels as though her hair loss has substantially slowed since initiating the Grifulvin.  ROS: She denies any recent rash, pruritus, fever, chills, headache, neck pain, photosensitivity, nausea, vomiting, diarrhea, abdominal pain, weakness, numbness, or paresthesias.  CC, SH/smoking status, and VS noted  Objective: BP 140/78   Pulse 76   Temp 98.9 F (37.2 C) (Oral)   Wt 155 lb (70.3 kg)   BMI 27.90 kg/m  Gen: NAD, alert, cooperative, and pleasant. HEENT: NCAT, EOMI, PERRL, no more scalp dryness/flakiness that was present initially, areas of hypopigmentation with alopecia still present without obvious evidence of worsening. No new dark spots present. [See photos below] CV: RRR, no murmur Resp: CTAB       Assessment and plan:  Tinea capitis Stable: Patient is here for follow-up on her tinea capitis. It is difficult to assess whether or not patient has had significant improvement since initiation and completion of her Grifulvin medication. That said, I feel that she has not had any significant worsening since our last visit. - Continue to monitor. - Return precautions discussed; patient is to come back if she feels as though she  has worsening hair loss. - Follow-up 2-3 months.  Next: if hair loss persists, DDx: Alopecia areata, discoid lupus, secondary syphilis, cicatricial alopecia, etc.   Elberta Leatherwood, MD,MS,  PGY3 10/22/2016 5:32 PM

## 2016-10-22 NOTE — Assessment & Plan Note (Addendum)
Stable: Patient is here for follow-up on her tinea capitis. It is difficult to assess whether or not patient has had significant improvement since initiation and completion of her Grifulvin medication. That said, I feel that she has not had any significant worsening since our last visit. - Continue to monitor. - Return precautions discussed; patient is to come back if she feels as though she has worsening hair loss. - Follow-up 2-3 months.  Next: if hair loss persists, DDx: Alopecia areata, discoid lupus, secondary syphilis, cicatricial alopecia, etc.

## 2016-12-22 ENCOUNTER — Other Ambulatory Visit: Payer: Self-pay | Admitting: Family Medicine

## 2016-12-31 ENCOUNTER — Encounter: Payer: Self-pay | Admitting: Family Medicine

## 2016-12-31 ENCOUNTER — Ambulatory Visit (INDEPENDENT_AMBULATORY_CARE_PROVIDER_SITE_OTHER): Payer: Medicare Other | Admitting: Family Medicine

## 2016-12-31 DIAGNOSIS — I10 Essential (primary) hypertension: Secondary | ICD-10-CM | POA: Diagnosis not present

## 2016-12-31 DIAGNOSIS — B350 Tinea barbae and tinea capitis: Secondary | ICD-10-CM

## 2016-12-31 MED ORDER — CELECOXIB 50 MG PO CAPS: 50.0000 mg | ORAL_CAPSULE | Freq: Two times a day (BID) | ORAL | 0 refills | Status: DC | PRN

## 2016-12-31 MED ORDER — PRAVASTATIN SODIUM 40 MG PO TABS: 40.0000 mg | ORAL_TABLET | Freq: Every day | ORAL | 3 refills | Status: DC

## 2016-12-31 MED ORDER — PANTOPRAZOLE SODIUM 40 MG PO TBEC: 40.0000 mg | DELAYED_RELEASE_TABLET | Freq: Every day | ORAL | 3 refills | Status: DC | PRN

## 2016-12-31 NOTE — Assessment & Plan Note (Signed)
Stable: No significant improvement with long course of antifungal. I informed patient that I would be willing to refer her to dermatology if she felt this was necessary. Patient refused. Patient stated that she would contact me if she changed her mind. - Consider dermatology referral in the future - Will monitor

## 2016-12-31 NOTE — Progress Notes (Signed)
   HPI  CC: Blood pressure check Patient is here for a blood pressure check. She states that she has been feeling well. No signs or symptoms of hypotension. No presyncopal symptoms. She has been active at home and tries to accomplish 30-60 minutes of walking daily. She has also been trying to eat well. She has no complaints at this time.  Alopecia >> patient had been seen earlier this year for issues of suspected tinea capitis with associated alopecia. Patient's symptoms do not seem to be improving much. Patient also states that she doesn't think they have gotten much worse either. She denies any new symptoms at this time.  ROS: Patient denies recent fever, chills, headache, vision changes, dizziness, lightheadedness, chest pain, shortness of breath, nausea, vomiting, diarrhea, dysuria, or peripheral edema.  CC, SH/smoking status, and VS noted  Objective: BP (!) 146/78   Temp 98.5 F (36.9 C) (Oral)   Wt 157 lb (71.2 kg)   BMI 28.26 kg/m  Gen: NAD, alert, cooperative, and pleasant. HEENT: NCAT, EOMI, PERRL, persistent areas of hair loss/thinning noted CV: RRR, no murmur Resp: CTAB, no wheezes, non-labored Neuro: Alert and oriented, Speech clear, No gross deficits   Assessment and plan:  HYPERTENSION, BENIGN SYSTEMIC Stable: Patient continues to be tolerating current medication regimen well. She denies any adverse side effects at this time. No symptoms of hypotension. - Continue current antihypertensive regimen - Discussed the possibility of discontinuing Celebrex >> patient stated that she would like to continue this due to pain relief from arthritis  Tinea capitis Stable: No significant improvement with long course of antifungal. I informed patient that I would be willing to refer her to dermatology if she felt this was necessary. Patient refused. Patient stated that she would contact me if she changed her mind. - Consider dermatology referral in the future - Will  monitor   Meds ordered this encounter  Medications  . celecoxib (CELEBREX) 50 MG capsule    Sig: Take 1 capsule (50 mg total) by mouth 2 (two) times daily as needed.    Dispense:  180 capsule    Refill:  0  . pravastatin (PRAVACHOL) 40 MG tablet    Sig: Take 1 tablet (40 mg total) by mouth daily.    Dispense:  90 tablet    Refill:  3  . pantoprazole (PROTONIX) 40 MG tablet    Sig: Take 1 tablet (40 mg total) by mouth daily as needed.    Dispense:  90 tablet    Refill:  3     Elberta Leatherwood, MD,MS,  PGY3 12/31/2016 5:41 PM

## 2016-12-31 NOTE — Assessment & Plan Note (Signed)
Stable: Patient continues to be tolerating current medication regimen well. She denies any adverse side effects at this time. No symptoms of hypotension. - Continue current antihypertensive regimen - Discussed the possibility of discontinuing Celebrex >> patient stated that she would like to continue this due to pain relief from arthritis

## 2017-04-05 ENCOUNTER — Other Ambulatory Visit: Payer: Self-pay | Admitting: Family Medicine

## 2017-04-09 IMAGING — CT CT ABD-PELV W/ CM
2 of 5 series · 16 of 46 positions shown, 18 images · IV contrast (Omni 300)
Comparison: CT of the abdomen and pelvis from 03/30/2010, and
abdominal radiograph performed earlier today at [DATE] a.m.

CLINICAL DATA: Acute onset of vomiting. Nausea and diarrhea. Lower
abdominal pain. Initial encounter.

EXAM:
CT ABDOMEN AND PELVIS WITH CONTRAST
TECHNIQUE: Multidetector CT imaging of the abdomen and pelvis was performed
using the standard protocol following bolus administration of
intravenous contrast.
CONTRAST:  100mL OMNIPAQUE IOHEXOL 300 MG/ML  SOLN

[Series 2: abd/ pelvis 5.0 i30f 1 · axial · 0.88mm/px · z∈[-339,+31]mm · 13 of 84 slices shown, 15 images]
[im 5/84  soft-tissue]
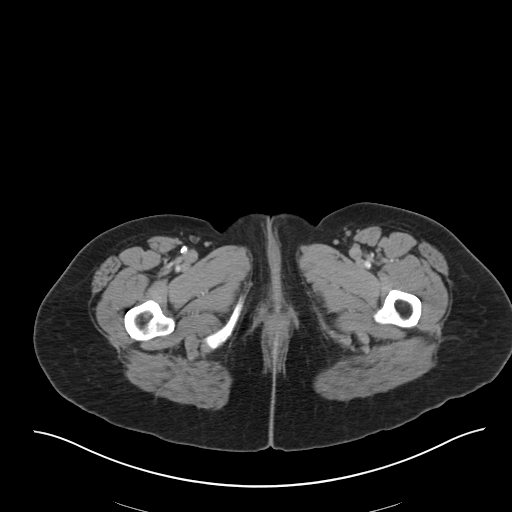
[im 5/84  bone]
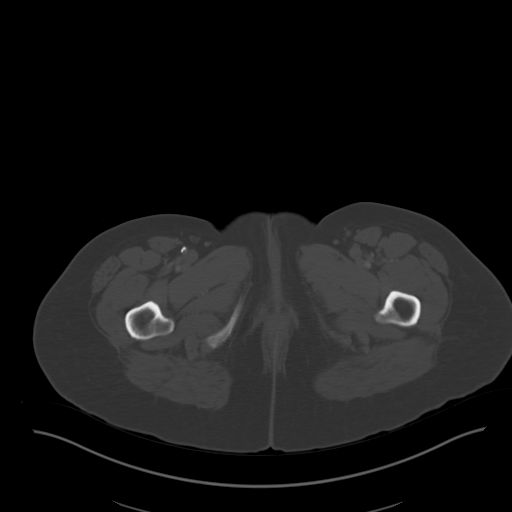
[im 10/84  soft-tissue]
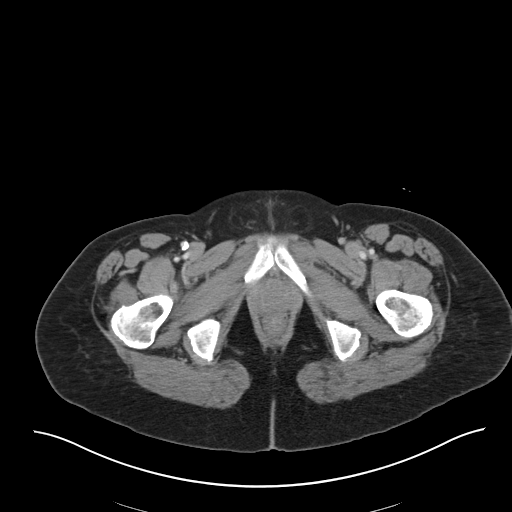
[im 19/84  soft-tissue]
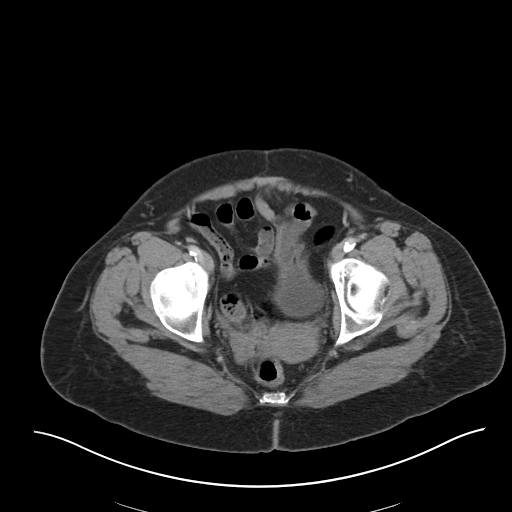
[im 24/84  soft-tissue]
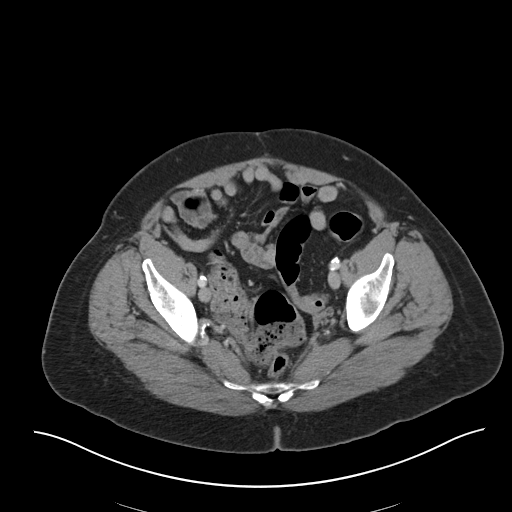
[im 28/84  soft-tissue]
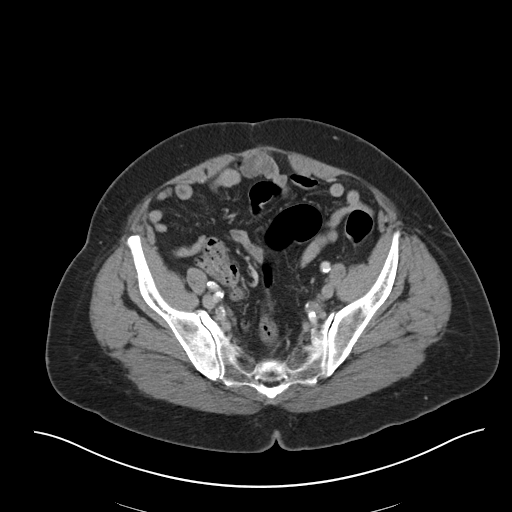
[im 37/84  soft-tissue]
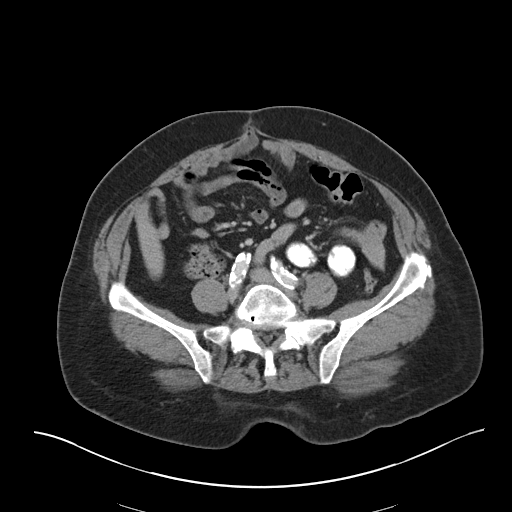
[im 42/84  soft-tissue]
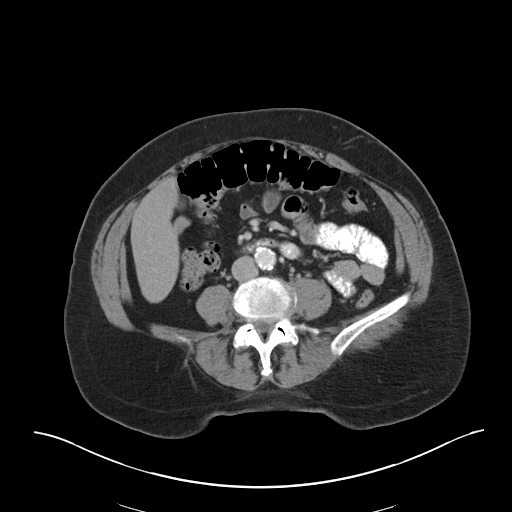
[im 47/84  soft-tissue]
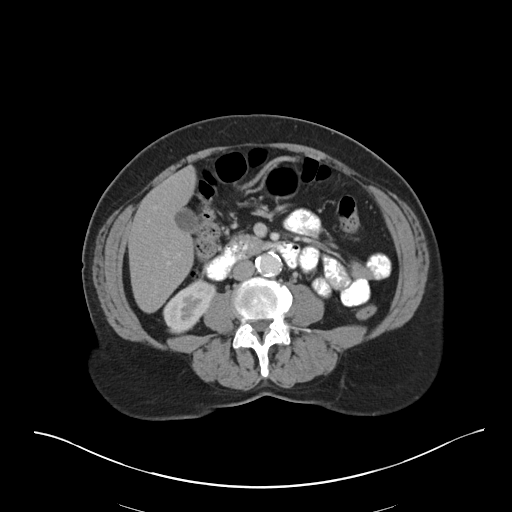
[im 56/84  soft-tissue]
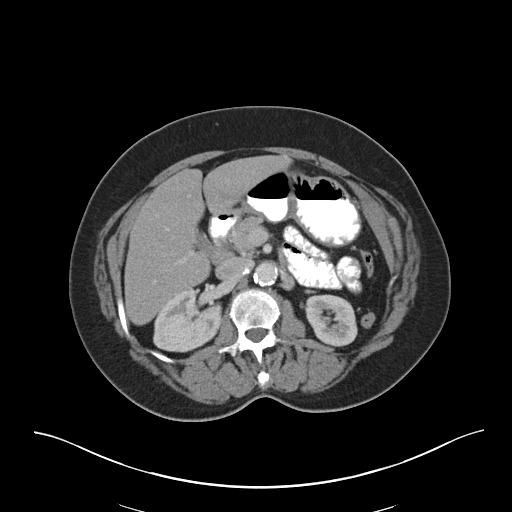
[im 56/84  bone]
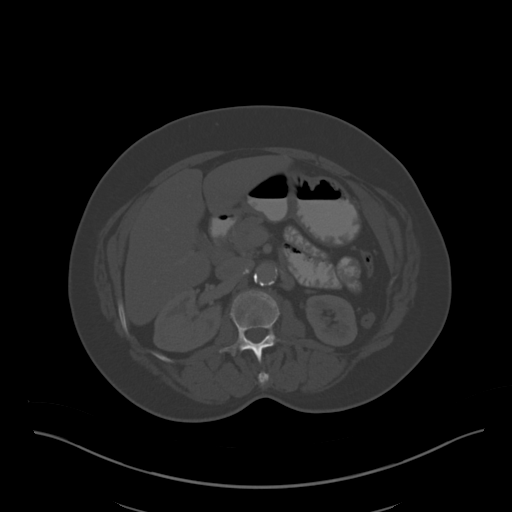
[im 60/84  soft-tissue]
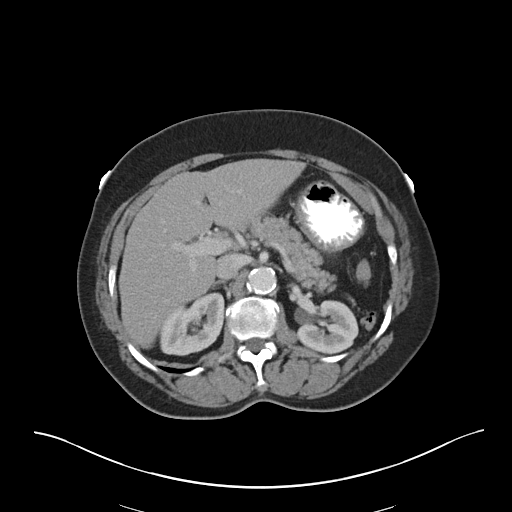
[im 65/84  soft-tissue]
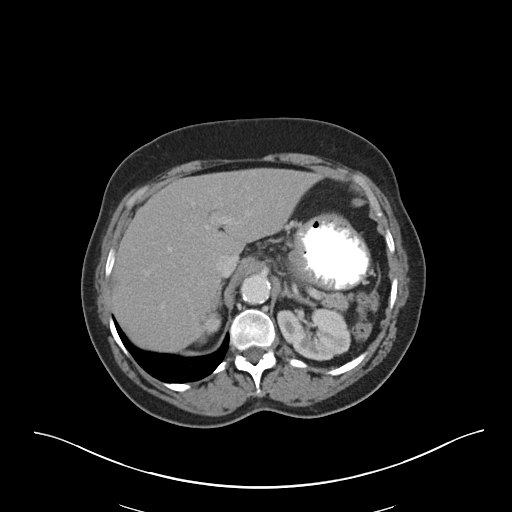
[im 74/84  soft-tissue]
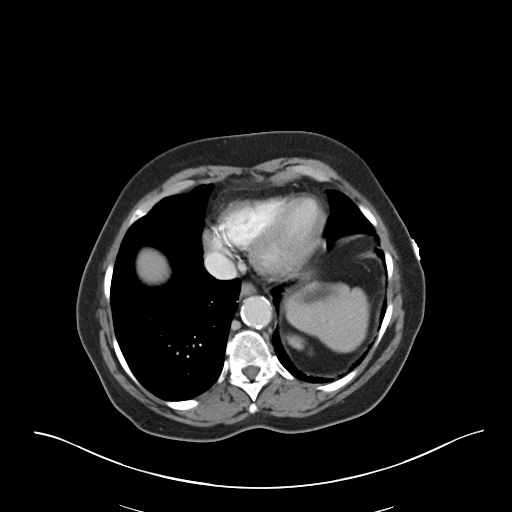
[im 79/84  soft-tissue]
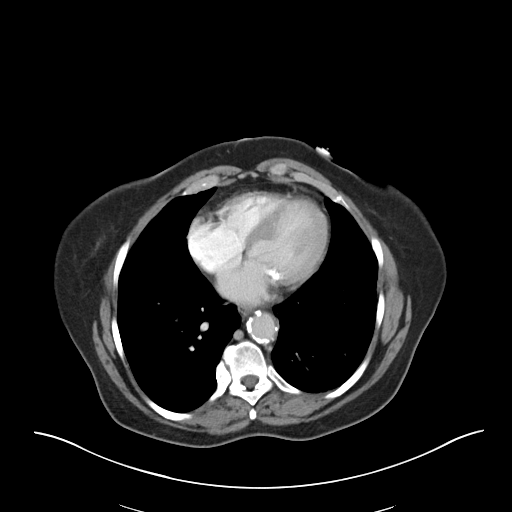

[Series 5: coronals · coronal · 0.71mm/px · 3 of 157 slices shown]
[im 53/157  soft-tissue]
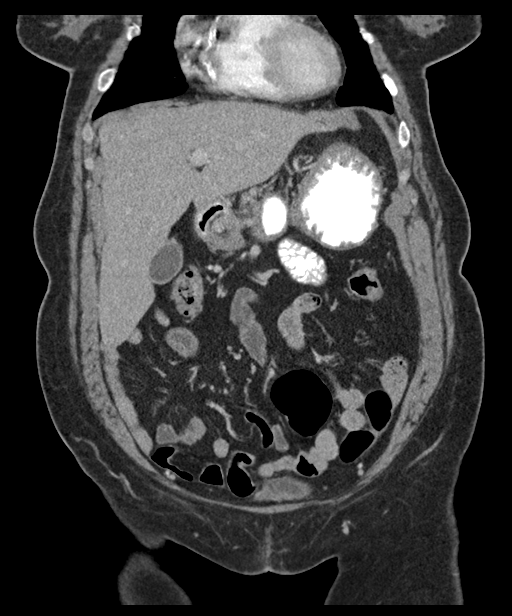
[im 70/157  soft-tissue]
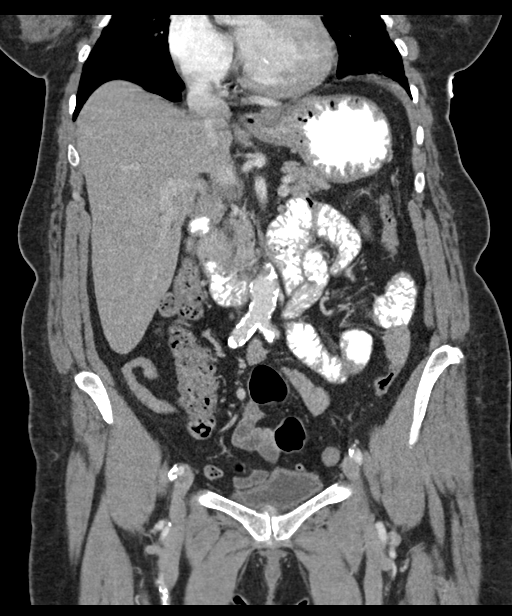
[im 87/157  soft-tissue]
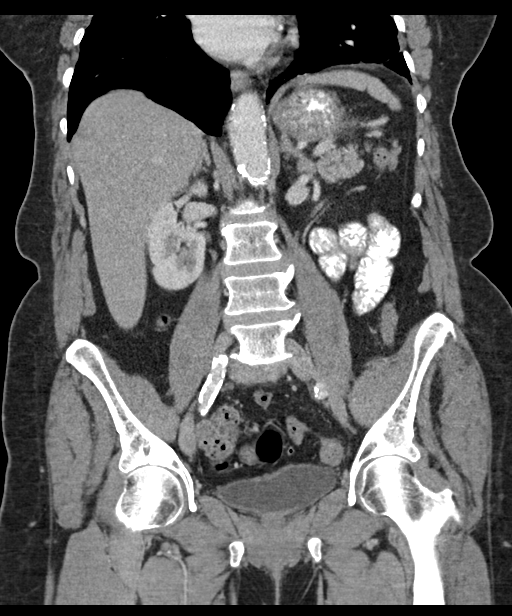

[16 of 46 positions shown; findings below may reference images not displayed]

FINDINGS: The visualized lung bases are clear. Diffuse coronary artery
calcifications are seen.

The liver and spleen are unremarkable in appearance. The gallbladder
is within normal limits. The pancreas and adrenal glands are
unremarkable.

A 1.8 cm cyst is noted at the lower pole of the right kidney. There
is no evidence of hydronephrosis. No renal or ureteral stones are
seen. No perinephric stranding is appreciated.

No free fluid is identified. The small bowel is unremarkable in
appearance. Mild mucosal thickening at the fundus of the stomach is
nonspecific and may be within normal limits. No acute vascular
abnormalities are seen. Diffuse calcification is seen along the
abdominal aorta and its branches. There is mild ectasia of the
distal abdominal aorta, without evidence of aneurysmal dilatation.

The appendix is normal in caliber and contains air, tracking along
the mid pelvis. The cecum is noted at the right hemipelvis. The
colon is unremarkable in appearance.

The bladder is mildly distended and grossly unremarkable. The uterus
is grossly unremarkable. The ovaries are relatively symmetric. No
suspicious adnexal masses are seen. No inguinal lymphadenopathy is
seen.

No acute osseous abnormalities are identified. Vacuum phenomenon is
noted at L5-S1, with underlying facet disease.
IMPRESSION: 1. Mild mucosal thickening at the fundus of the stomach is
nonspecific and may be within normal limits, or could reflect mild
gastritis given the patient's symptoms.
2. Diffuse coronary artery calcifications seen.
3. Diffuse calcification along the abdominal aorta and its branches.
4. Small right renal cyst seen.

## 2017-04-17 ENCOUNTER — Ambulatory Visit (INDEPENDENT_AMBULATORY_CARE_PROVIDER_SITE_OTHER): Payer: Medicare Other | Admitting: Internal Medicine

## 2017-04-17 ENCOUNTER — Encounter: Payer: Self-pay | Admitting: Internal Medicine

## 2017-04-17 VITALS — BP 150/88 | HR 72 | Temp 98.6°F | Ht 62.0 in | Wt 154.0 lb

## 2017-04-17 DIAGNOSIS — M069 Rheumatoid arthritis, unspecified: Secondary | ICD-10-CM | POA: Diagnosis not present

## 2017-04-17 DIAGNOSIS — Z23 Encounter for immunization: Secondary | ICD-10-CM | POA: Diagnosis not present

## 2017-04-17 DIAGNOSIS — J449 Chronic obstructive pulmonary disease, unspecified: Secondary | ICD-10-CM | POA: Diagnosis not present

## 2017-04-17 DIAGNOSIS — Z1159 Encounter for screening for other viral diseases: Secondary | ICD-10-CM | POA: Diagnosis not present

## 2017-04-17 DIAGNOSIS — I739 Peripheral vascular disease, unspecified: Secondary | ICD-10-CM

## 2017-04-17 DIAGNOSIS — I10 Essential (primary) hypertension: Secondary | ICD-10-CM

## 2017-04-17 DIAGNOSIS — I2510 Atherosclerotic heart disease of native coronary artery without angina pectoris: Secondary | ICD-10-CM

## 2017-04-17 MED ORDER — CELECOXIB 50 MG PO CAPS: 50.0000 mg | ORAL_CAPSULE | Freq: Two times a day (BID) | ORAL | 0 refills | Status: DC | PRN

## 2017-04-17 NOTE — Progress Notes (Signed)
   [name] Kerrin Mo, MD Phone: [phone]  Reason For Visit: Follow up   # CHRONIC HTN Current Meds - losartan, HCTZ  Reports good compliance, took meds today. Tolerating well, w/o complaints. Lifestyle - decreasing your smoking over the last few months  Denies CP, dyspnea, HA, edema, dizziness / lightheadedness  # COPD Tested for it 6-7 years ago which per patient was fine  mmRC Dyspnea Scale - have patient score 1-5  1. Are you troubled by shortness of breath when hurrying on level ground or walking up a slight hill? No  2. Do you have to walk slower than people of your age on level ground because of shortness of breath? No 3. Do you ever have to stop for breath when walking at your own pace on level ground? No 4. Do you ever have to stop for breath when walking about 100 yards (or after a few minutes) on level ground? No 5 Are you too short of breath to leave the house or short of breath on dressing or undressing? No  How many exacerbation do you have in 1 year? How many hospitalizations? None  Does not have to use Albuterol inhaler at all  Claudication/PAD  - Used to have intermittent pain in her legs  - since starting the cilostazol denies any pain her legs    -Patient has two stent in her heart, took plavix  - taking aspirin daily  - taking pravastatin 40 mg daily   Past Medical History Reviewed problem list.  Medications- reviewed and updated No additions to family history Social history- patient is a smoked for about 40 years, had a time when she was smoking 2 packs, now smoking about 1 pack a week   Objective: BP (!) 150/88   Pulse 72   Temp 98.6 F (37 C) (Oral)   Ht 5\' 2"  (1.575 m)   Wt 154 lb (69.9 kg)   SpO2 97%   BMI 28.17 kg/m  Gen: NAD, alert, cooperative with exam Cardio: regular rate and rhythm, S1S2 heard, no murmurs appreciated Pulm: clear to auscultation bilaterally, no wheezes, rhonchi or rales  MSK: Normal  gait and station Skin: dry, intact, no rashes or lesions  Assessment/Plan: See problem based a/p  Rheumatoid arthritis Patient with a both rheumatoid arthritis and osteoarthritis noted on her problem list. She has never seen a rheumatologist or had a workup. The patient is currently on celecoxib.Currently her PIP joints of 3rd and 4th digit on her left hand are swollen. She also indicates pain in her wrists. Osteoarthritis and RA can both effect the PIP joint, therefore will get some initial labs and follow up based on these  - Rheumatoid factor - ANA - Currently on celecoxib - discussed with patient given hx of PAD and stents - NSAIDs are not preferred. Patient would like to continue for now  - Basic metabolic panel - Follow up in 1 month    COPD (chronic obstructive pulmonary disease) Welll controlled, no dyspnea  - Albuterol inhaler as needed   CLAUDICATION, INTERMITTENT Denies any intermittent leg pain, chest pain, SOB  Continue Cilstazol  Continue pravastatin and aspirin

## 2017-04-17 NOTE — Patient Instructions (Addendum)
I want you to make sure you are following up with your cardiologist. I will refill your pain medication to help with hand swelling. I am going to check on some lab work today to come to determine whether we need to send you to a rheumatologist or not. I want you to follow-up in about 1 month for your blood pressure to be rechecked.

## 2017-04-18 LAB — RHEUMATOID FACTOR: Rhuematoid fact SerPl-aCnc: <10 IU/mL (ref 0.0–13.9)

## 2017-04-18 LAB — BASIC METABOLIC PANEL
BUN/Creatinine Ratio: 12 (ref 12–28)
BUN: 9 mg/dL (ref 8–27)
CO2: 25 mmol/L (ref 20–29)
Calcium: 9.9 mg/dL (ref 8.7–10.3)
Chloride: 92 mmol/L — ABNORMAL LOW (ref 96–106)
Creatinine, Ser: 0.77 mg/dL (ref 0.57–1.00)
GFR, EST AFRICAN AMERICAN: 90 mL/min/{1.73_m2} (ref >59)
GFR, EST NON AFRICAN AMERICAN: 78 mL/min/{1.73_m2} (ref >59)
Glucose: 85 mg/dL (ref 65–99)
Potassium: 4.2 mmol/L (ref 3.5–5.2)
Sodium: 134 mmol/L (ref 134–144)

## 2017-04-18 LAB — HCV COMMENT:

## 2017-04-18 LAB — HEPATITIS C ANTIBODY (REFLEX)

## 2017-04-18 LAB — ANA: Anti Nuclear Antibody(ANA): POSITIVE — AB

## 2017-04-22 NOTE — Assessment & Plan Note (Signed)
Denies any intermittent leg pain, chest pain, SOB  Continue Cilstazol  Continue pravastatin and aspirin

## 2017-04-22 NOTE — Assessment & Plan Note (Signed)
Welll controlled, no dyspnea  - Albuterol inhaler as needed

## 2017-04-22 NOTE — Assessment & Plan Note (Signed)
Blood pressure elevated, patient would like to work on exercise and weight loss instead of increasing medication  -Continue Losartan and HCTZ - Follow up in 1 month

## 2017-04-22 NOTE — Assessment & Plan Note (Addendum)
Patient with a both rheumatoid arthritis and osteoarthritis noted on her problem list. She has never seen a rheumatologist or had a workup. The patient is currently on celecoxib.Currently her PIP joints of 3rd and 4th digit on her left hand are swollen. She also indicates pain in her wrists. Osteoarthritis and RA can both effect the PIP joint, therefore will get some initial labs and follow up based on these  - Rheumatoid factor - ANA - Currently on celecoxib - discussed with patient given hx of PAD and stents - NSAIDs are not preferred. Patient would like to continue for now  - Basic metabolic panel - Follow up in 1 month

## 2017-04-24 ENCOUNTER — Telehealth: Payer: Self-pay | Admitting: Internal Medicine

## 2017-04-24 ENCOUNTER — Encounter: Payer: Self-pay | Admitting: Internal Medicine

## 2017-04-24 DIAGNOSIS — M2540 Effusion, unspecified joint: Secondary | ICD-10-CM

## 2017-04-24 NOTE — Telephone Encounter (Signed)
Tried to call patient to let her know about positive ANA results. Will place a referral to Rheumatology for further work up. Unable to get in touch with patient. Please give a call and let them know given their positive results will have them follow up with Rheumatology. If they have further questions, please let me know and I will give them a call back.

## 2017-04-25 NOTE — Telephone Encounter (Signed)
Unsure of what to say if she has questions about her positive ANA. It would be more appropriate for you to try pt again. Deseree Kennon Holter, CMA

## 2017-05-02 NOTE — Telephone Encounter (Signed)
Tried to call patient x 2 again. No answer and no way to leave a message. Will send patient a letter in mail since unable to get in contact with patient

## 2017-05-10 ENCOUNTER — Telehealth: Payer: Self-pay | Admitting: *Deleted

## 2017-05-10 NOTE — Telephone Encounter (Signed)
Patient left message on nurse line requesting return call from PCP to discuss referral to rheumatology. Hubbard Hartshorn, RN, BSN

## 2017-05-13 NOTE — Telephone Encounter (Signed)
Patient received letter in the mail. Calling regarding it's content. Discussed briefly over the phone. Plan to follow up this week in clinic

## 2017-05-16 ENCOUNTER — Ambulatory Visit (INDEPENDENT_AMBULATORY_CARE_PROVIDER_SITE_OTHER): Payer: Medicare Other | Admitting: Internal Medicine

## 2017-05-16 ENCOUNTER — Encounter: Payer: Self-pay | Admitting: Internal Medicine

## 2017-05-16 VITALS — BP 150/82 | HR 89 | Temp 98.5°F | Ht 62.0 in | Wt 154.0 lb

## 2017-05-16 DIAGNOSIS — I10 Essential (primary) hypertension: Secondary | ICD-10-CM

## 2017-05-16 DIAGNOSIS — M2540 Effusion, unspecified joint: Secondary | ICD-10-CM | POA: Diagnosis not present

## 2017-05-16 NOTE — Patient Instructions (Signed)
You are going to to follow up with Dr. Estanislado Pandy. You need to call to make an appointment # [phone].  Address is  [address], [place], [place] [address]. Please follow up with me afterwards in about 2 months

## 2017-05-16 NOTE — Assessment & Plan Note (Signed)
Continues to have joint swelling, positive AN Discuss results with the patient.  Rheumatology referral placed. Not been able to contact the patient.  Patient with dermatology number and address and told her to call them for an appointment.

## 2017-05-16 NOTE — Assessment & Plan Note (Signed)
Pressure elevated today.  Previously well controlled patient is currently on losartan hydrochlorothiazide. Believes it is due to agitation.  Will follow-up with me in 2 months

## 2017-05-16 NOTE — Progress Notes (Signed)
   [facility] Kerrin Mo, MD Phone: [phone]  Reason For Visit: follow-up joint swelling  # Joint Swelling  Continues to have joint swelling slightly improved from previous.  This patient her results given her the name is positive with a negative rheumatoid factor and history of joint swelling.  Recommend patient be seen by rheumatology.  Patient states she is also concerned about this issue.  #blood pressure elevated today -States she took her medications this morning and has been taking her medications upon admission.  She states that she had to rash to get here and was flying through traffic to be on time. Denies any headaches, dizziness, dyspnea, chest pain.  Past Medical History Reviewed problem list.  Medications- reviewed and updated No additions to family history Social history- patient is a smoker  Objective: BP (!) 150/82   Pulse 89   Temp 98.5 F (36.9 C) (Oral)   Ht 5\' 2"  (1.575 m)   Wt 154 lb (69.9 kg)   SpO2 99%   BMI 28.17 kg/m  Gen: NAD, alert, cooperative with exam Cardio: regular rate and rhythm, S1S2 heard, no murmurs appreciated Extremities: noted to still have some mild swelling in the DIP joint of the left hand Skin: dry, intact, no rashes or lesions   Assessment/Plan: See problem based a/p  HYPERTENSION, BENIGN SYSTEMIC Pressure elevated today.  Previously well controlled patient is currently on losartan hydrochlorothiazide. Believes it is due to agitation.  Will follow-up with me in 2 months  Joint swelling Continues to have joint swelling, positive AN Discuss results with the patient.  Rheumatology referral placed. Not been able to contact the patient.  Patient with dermatology number and address and told her to call them for an appointment.

## 2017-06-03 NOTE — Progress Notes (Signed)
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 female who presents with  hand pain and stiffness that started about 15 years ago. She states she first noticed swelling around her hand joints and then the pain developed over time. Patient states her hands have become increasingly painful and stiff.  She reports right shoulder and elbow pain for the past 2 weeks.  Denies any swelling of her joints.  She also has pain in right CMC joint.  Occasional back pain and right hip.  She reports her bilateral knees are "puffy" intermittently, expecially after climbing stairs.    Activities of Daily Living:  Patient reports morning stiffness for 30 minutes.   Patient Reports nocturnal pain.  Difficulty dressing/grooming: Denies Difficulty climbing stairs: Denies Difficulty getting out of chair: Denies Difficulty using hands for taps, buttons, cutlery, and/or writing: Denies   Review of Systems  Constitutional: Negative for fatigue, night sweats, weight gain, weight loss and weakness.  HENT: Negative for mouth sores, trouble swallowing, trouble swallowing, mouth dryness and nose dryness.   Eyes: Negative for pain, redness, visual disturbance and dryness.  Respiratory: Negative for cough, shortness of breath and difficulty breathing.   Cardiovascular: Negative for chest pain, palpitations, hypertension, irregular heartbeat and swelling in legs/feet.  Gastrointestinal: Negative for blood in stool, constipation and diarrhea.  Endocrine: Negative for increased urination.  Genitourinary: Negative for vaginal dryness.  Musculoskeletal: Positive for arthralgias, joint pain and morning stiffness.  Negative for joint swelling, myalgias, muscle weakness, muscle tenderness and myalgias.  Skin: Positive for hair loss. Negative for color change, rash, skin tightness, ulcers and sensitivity to sunlight.  Allergic/Immunologic: Negative for susceptible to infections.  Neurological: Positive for headaches. Negative for dizziness, memory loss and night sweats.  Hematological: Negative for swollen glands.  Psychiatric/Behavioral: Negative for depressed mood and sleep disturbance. The patient is not nervous/anxious.     PMFS History:  Patient Active Problem List   Diagnosis Date Noted  . Screening for viral disease 04/17/2017  . Osteopenia 11/04/2014  . Joint swelling 10/17/2012  . Sinus tachycardia 12/10/2011  . COPD (chronic obstructive pulmonary disease) ([place]) 10/01/2011  . Restrictive lung disease 10/01/2011  . ACNE VULGARIS, CYSTIC, PUSTULAR 03/28/2010  . G E R D 11/20/2006  . HYPERCHOLESTEROLEMIA 09/19/2006  . Smoker 09/19/2006  . HYPERTENSION, BENIGN SYSTEMIC 09/19/2006  . Coronary atherosclerosis 09/19/2006  . CLAUDICATION, INTERMITTENT 09/19/2006  . Allergic rhinitis 09/19/2006  . Osteoarthritis of multiple joints 09/19/2006    Past Medical History:  Diagnosis Date  . [place] tarry stools    from iron pills  . CHF (congestive heart failure) ([place]) 1997  . Colon polyps 2005  . COPD (chronic obstructive pulmonary disease) ([place])   . GERD (gastroesophageal reflux disease)   . Heart attack ([place]) 1997 and 2001  . Hyperlipidemia   . Hypertension   . Pneumonia 2012  . Status post dilation of esophageal narrowing     Family History  Problem Relation Age of Onset  . Heart disease Father   . Colon cancer Mother 37  EARLY 70'S  . Cancer Mother        colon  . Clotting disorder Brother   . Diabetes Sister    Past Surgical History:  Procedure Laterality Date  . COLONOSCOPY    . CORONARY ANGIOPLASTY WITH STENT PLACEMENT  2002  . POLYPECTOMY     Social History    Social History Narrative  . Not on file     Objective: Vital Signs: BP 136/83 (BP Location: Left Arm, Patient Position: Sitting, Cuff Size: Small)   Pulse 76   Resp 13   Ht 5\' 2"  (1.575 m)   Wt 156 lb (70.8 kg)   BMI 28.53 kg/m    Physical Exam  Constitutional: She is oriented to person, place, and time. She appears well-developed and well-nourished.  HENT:  Head: Normocephalic and atraumatic.  Eyes: Conjunctivae and EOM are normal.  Neck: Normal range of motion.  Cardiovascular: Normal rate, regular rhythm, normal heart sounds and intact distal pulses.  Pulmonary/Chest: Effort normal and breath sounds normal.  Abdominal: Soft. Bowel sounds are normal.  Lymphadenopathy:    She has no cervical adenopathy.  Neurological: She is alert and oriented to person, place, and time.  Skin: Skin is warm and dry. Capillary refill takes less than 2 seconds.  Psychiatric: She has a normal mood and affect. Her behavior is normal.  Nursing note and vitals reviewed.    Musculoskeletal Exam:C-spine and thoracic good ROM. Slightly limited ROM of lumbar spine. Flexion contracture of right elbow 5 degrees. Left elbow ROM normal.  Limited wrist extension, right: 35 degrees extension, flexion 60 degrees. Left: 30 with extension, 65 degrees of flexion. Bilateral CMC thickening. PIP and DIP thickening of all digits on right hand. Limited extension of all PIP and second digit DIP of right hand. PIP thickening of all digits on the left hand. Limited extension of 2nd and 3rd DIP of left hand.  No effusion or warmth of knee and ankles.  Crepitus bilateral knee joints. Hallux valgus deformity bilaterally. No synovitis of PIP or DIPs of bilateral feet.    Discomfort with hip ROM.   CDAI Exam: No CDAI exam completed.    Investigation: No additional findings. 04/17/2017 RF negative, ANA positive, hepatitis C negative, BMP normal Imaging: Xr Hip Unilat W Or W/o Pelvis 2-3 Views Left  Result Date:  06/17/2017 Moderate superolateral narrowing of left hip. Impression: Moderate osteoarthritis of hip joint.   Xr Hip Unilat W Or W/o Pelvis 2-3 Views Right  Result Date: 06/17/2017 Moderate superolateral narrowing of right hip. Impression: moderate osteoarthritis of hip joint.   Xr Hand 2 View Left  Result Date: 06/17/2017 CMC narrowing. Severe PIP narrowing of all digits.  Most severe in the 3rd digit. All DIP narrowing. No erosive changes.  Impression: severe osteoarthritis of the hand  Xr Hand 2 View Right  Result Date: 06/17/2017 CMC narrowing. Erosive changes in 2nd digit DIP. All PIP and DIP narrowing. Most severe in the 3rd PIP with subluxation.  Impression: severe osteoarthritis of right hand   Xr Knee 3 View Left  Result Date: 06/17/2017 Intercondylar osteophytes.  No chondrocalcinosis. Mild medial compartment narrowing Severe patellofemoral narrowing Impression: Severe chondromalacia patella and mild osteoarthritis.  Xr Knee 3 View Right  Result Date: 06/17/2017 Intercondylar osteophytes.  No chondrocalcinosis. Mild medial compartment narrowing Severe patellofemoral narrowing Impression: Severe chondromalacia patella and mild osteoarthritis.   Speciality Comments: No specialty comments available.    Procedures:  No procedures performed Allergies: Lisinopril and Penicillins   Assessment /  Plan:     Visit Diagnoses: Polyarthralgia - Positive ANA, no titer given, RF negative -patient complains of intermittent joint swelling. Her pain has been mostly in her hands. She also had few patches of all spots in her scalp. I've advised her to see dermatology for evaluation. To complete the workup I'll obtain following labs today. Plan: Sjogrens syndrome-B extractable nuclear antibody, Sjogrens syndrome-A extractable nuclear antibody, Anti-Smith antibody, RNP Antibody, Anti-scleroderma antibody, Anti-DNA antibody, double-stranded, C3 and C4, ANA, Rheumatoid factor, Cyclic citrul  peptide antibody, IgG, Angiotensin converting enzyme  Flexion contracture of right elbow - 5 degrees. She had no synovitis.  Pain in both hands - Plan: XR Hand 2 View Left, XR Hand 2 View Right. The x-rays were consistent with severe osteoarthritis of bilateral hands and erosive change in the right second finger DIP. Natural anti-inflammatories were discussed. Use of topical Voltaren gel was also discussed. A prescription was given and side effects were reviewed.  Pain of both hip joints - Plan: XR HIP UNILAT W OR W/O PELVIS 2-3 VIEWS RIGHT, XR HIP UNILAT W OR W/O PELVIS 2-3 VIEWS LEFT. The hip joint x-rays show moderate osteoarthritis of bilateral hip joints. Weight loss diet and exercise was discussed. Exercises were demonstrated and handout was given.  Chronic pain of both knees - Plan: XR KNEE 3 VIEW LEFT, XR KNEE 3 VIEW RIGHT, Uric acid. She has severe chondromalacia patella and mild osteoarthritis. Weight loss would be helpful.  Other fatigue - Plan: CBC with Differential/Platelet, Hepatic function panel  Osteopenia of multiple sites - 10/20/2014 T score -1.6 lumbar, BMD 0.924, left femur T score -1.9, BMD 0.640. Use of calcium and vitamin D discussed.  Chronic obstructive pulmonary disease, unspecified COPD type ([place]): Chronic smoker.  Atherosclerosis of coronary artery of native heart   HYPERCHOLESTEROLEMIA  HYPERTENSION, BENIGN SYSTEMIC  Smoker - 1/3 ppd x 50years .  smoking cessation was discussed.   Orders: Orders Placed This Encounter  Procedures  . XR KNEE 3 VIEW LEFT  . XR KNEE 3 VIEW RIGHT  . XR HIP UNILAT W OR W/O PELVIS 2-3 VIEWS RIGHT  . XR HIP UNILAT W OR W/O PELVIS 2-3 VIEWS LEFT  . XR Hand 2 View Left  . XR Hand 2 View Right  . Uric acid  . CBC with Differential/Platelet  . Hepatic function panel  . Sjogrens syndrome-B extractable nuclear antibody  . Sjogrens syndrome-A extractable nuclear antibody  . Anti-Smith antibody  . RNP Antibody  .  Anti-scleroderma antibody  . Anti-DNA antibody, double-stranded  . C3 and C4  . ANA  . Rheumatoid factor  . Cyclic citrul peptide antibody, IgG  . Angiotensin converting enzyme   Meds ordered this encounter  Medications  . diclofenac sodium (VOLTAREN) 1 % GEL    Sig: Apply 3 gm to 3 large joints up to 3 times a day.Dispense 3 tubes with 3 refills.    Dispense:  3 Tube    Refill:  1    Face-to-face time spent with patient was 50 minutes. Greater than 50% of time was spent in counseling and coordination of care.  Follow-Up Instructions: No Follow-up on file.   Bo Merino, MD  Note - This record has been created using Editor, commissioning.  Chart creation errors have been sought, but may not always  have been located. Such creation errors do not reflect on  the standard of medical care.

## 2017-06-17 ENCOUNTER — Ambulatory Visit: Payer: Medicare Other | Admitting: Rheumatology

## 2017-06-17 ENCOUNTER — Telehealth: Payer: Self-pay | Admitting: Pharmacist

## 2017-06-17 ENCOUNTER — Ambulatory Visit (INDEPENDENT_AMBULATORY_CARE_PROVIDER_SITE_OTHER): Payer: Self-pay

## 2017-06-17 ENCOUNTER — Encounter: Payer: Self-pay | Admitting: Rheumatology

## 2017-06-17 VITALS — BP 136/83 | HR 76 | Resp 13 | Ht 62.0 in | Wt 156.0 lb

## 2017-06-17 DIAGNOSIS — M25551 Pain in right hip: Secondary | ICD-10-CM | POA: Diagnosis not present

## 2017-06-17 DIAGNOSIS — I2510 Atherosclerotic heart disease of native coronary artery without angina pectoris: Secondary | ICD-10-CM | POA: Diagnosis not present

## 2017-06-17 DIAGNOSIS — M2550 Pain in unspecified joint: Secondary | ICD-10-CM | POA: Diagnosis not present

## 2017-06-17 DIAGNOSIS — M79641 Pain in right hand: Secondary | ICD-10-CM | POA: Diagnosis not present

## 2017-06-17 DIAGNOSIS — I10 Essential (primary) hypertension: Secondary | ICD-10-CM

## 2017-06-17 DIAGNOSIS — M25562 Pain in left knee: Secondary | ICD-10-CM

## 2017-06-17 DIAGNOSIS — F17200 Nicotine dependence, unspecified, uncomplicated: Secondary | ICD-10-CM | POA: Diagnosis not present

## 2017-06-17 DIAGNOSIS — M25552 Pain in left hip: Secondary | ICD-10-CM

## 2017-06-17 DIAGNOSIS — M24521 Contracture, right elbow: Secondary | ICD-10-CM | POA: Diagnosis not present

## 2017-06-17 DIAGNOSIS — E7800 Pure hypercholesterolemia, unspecified: Secondary | ICD-10-CM | POA: Diagnosis not present

## 2017-06-17 DIAGNOSIS — M8589 Other specified disorders of bone density and structure, multiple sites: Secondary | ICD-10-CM

## 2017-06-17 DIAGNOSIS — R5383 Other fatigue: Secondary | ICD-10-CM

## 2017-06-17 DIAGNOSIS — M25561 Pain in right knee: Secondary | ICD-10-CM | POA: Diagnosis not present

## 2017-06-17 DIAGNOSIS — M79642 Pain in left hand: Secondary | ICD-10-CM | POA: Diagnosis not present

## 2017-06-17 DIAGNOSIS — J449 Chronic obstructive pulmonary disease, unspecified: Secondary | ICD-10-CM

## 2017-06-17 DIAGNOSIS — G8929 Other chronic pain: Secondary | ICD-10-CM

## 2017-06-17 MED ORDER — DICLOFENAC SODIUM 1 % TD GEL: TRANSDERMAL | 1 refills | Status: DC

## 2017-06-17 NOTE — Patient Instructions (Addendum)
Natural anti-inflammatories  You can purchase these at [employer], [employer] or online.  . Turmeric (capsules)  . Ginger (ginger root or capsules)  . Omega 3 (Fish, flax seeds, chia seeds, walnuts, almonds)  . Tart cherry (dried or extract)   Patient should be under the care of a physician while taking these supplements. This may not be reproduced without the permission of Dr. Neal Trulson.  Knee Exercises Ask your health care provider which exercises are safe for you. Do exercises exactly as told by your health care provider and adjust them as directed. It is normal to feel mild stretching, pulling, tightness, or discomfort as you do these exercises, but you should stop right away if you feel sudden pain or your pain gets worse.Do not begin these exercises until told by your health care provider. STRETCHING AND RANGE OF MOTION EXERCISES These exercises warm up your muscles and joints and improve the movement and flexibility of your knee. These exercises also help to relieve pain, numbness, and tingling. Exercise A: Knee Extension, Prone 1. Lie on your abdomen on a bed. 2. Place your left / right knee just beyond the edge of the surface so your knee is not on the bed. You can put a towel under your left / right thigh just above your knee for comfort. 3. Relax your leg muscles and allow gravity to straighten your knee. You should feel a stretch behind your left / right knee. 4. Hold this position for __________ seconds. 5. Scoot up so your knee is supported between repetitions. Repeat __________ times. Complete this stretch __________ times a day. Exercise B: Knee Flexion, Active  1. Lie on your back with both knees straight. If this causes back discomfort, bend your left / right knee so your foot is flat on the floor. 2. Slowly slide your left / right heel back toward your buttocks until you feel a gentle stretch in the front of your knee or thigh. 3. Hold this position for  __________ seconds. 4. Slowly slide your left / right heel back to the starting position. Repeat __________ times. Complete this exercise __________ times a day. Exercise C: Quadriceps, Prone  1. Lie on your abdomen on a firm surface, such as a bed or padded floor. 2. Bend your left / right knee and hold your ankle. If you cannot reach your ankle or pant leg, loop a belt around your foot and grab the belt instead. 3. Gently pull your heel toward your buttocks. Your knee should not slide out to the side. You should feel a stretch in the front of your thigh and knee. 4. Hold this position for __________ seconds. Repeat __________ times. Complete this stretch __________ times a day. Exercise D: Hamstring, Supine 1. Lie on your back. 2. Loop a belt or towel over the ball of your left / right foot. The ball of your foot is on the walking surface, right under your toes. 3. Straighten your left / right knee and slowly pull on the belt to raise your leg until you feel a gentle stretch behind your knee. ? Do not let your left / right knee bend while you do this. ? Keep your other leg flat on the floor. 4. Hold this position for __________ seconds. Repeat __________ times. Complete this stretch __________ times a day. STRENGTHENING EXERCISES These exercises build strength and endurance in your knee. Endurance is the ability to use your muscles for a long time, even after they get tired. Exercise E:   Quadriceps, Isometric  1. Lie on your back with your left / right leg extended and your other knee bent. Put a rolled towel or small pillow under your knee if told by your health care provider. 2. Slowly tense the muscles in the front of your left / right thigh. You should see your kneecap slide up toward your hip or see increased dimpling just above the knee. This motion will push the back of the knee toward the floor. 3. For __________ seconds, keep the muscle as tight as you can without increasing your  pain. 4. Relax the muscles slowly and completely. Repeat __________ times. Complete this exercise __________ times a day. Exercise F: Straight Leg Raises - Quadriceps 1. Lie on your back with your left / right leg extended and your other knee bent. 2. Tense the muscles in the front of your left / right thigh. You should see your kneecap slide up or see increased dimpling just above the knee. Your thigh may even shake a bit. 3. Keep these muscles tight as you raise your leg 4-6 inches (10-15 cm) off the floor. Do not let your knee bend. 4. Hold this position for __________ seconds. 5. Keep these muscles tense as you lower your leg. 6. Relax your muscles slowly and completely after each repetition. Repeat __________ times. Complete this exercise __________ times a day. Exercise G: Hamstring, Isometric 1. Lie on your back on a firm surface. 2. Bend your left / right knee approximately __________ degrees. 3. Dig your left / right heel into the surface as if you are trying to pull it toward your buttocks. Tighten the muscles in the back of your thighs to dig as hard as you can without increasing any pain. 4. Hold this position for __________ seconds. 5. Release the tension gradually and allow your muscles to relax completely for __________ seconds after each repetition. Repeat __________ times. Complete this exercise __________ times a day. Exercise H: Hamstring Curls  If told by your health care provider, do this exercise while wearing ankle weights. Begin with __________ weights. Then increase the weight by 1 lb (0.5 kg) increments. Do not wear ankle weights that are more than __________. 1. Lie on your abdomen with your legs straight. 2. Bend your left / right knee as far as you can without feeling pain. Keep your hips flat against the floor. 3. Hold this position for __________ seconds. 4. Slowly lower your leg to the starting position.  Repeat __________ times. Complete this exercise  __________ times a day. Exercise I: Squats (Quadriceps) 1. Stand in front of a table, with your feet and knees pointing straight ahead. You may rest your hands on the table for balance but not for support. 2. Slowly bend your knees and lower your hips like you are going to sit in a chair. ? Keep your weight over your heels, not over your toes. ? Keep your lower legs upright so they are parallel with the table legs. ? Do not let your hips go lower than your knees. ? Do not bend lower than told by your health care provider. ? If your knee pain increases, do not bend as low. 3. Hold the squat position for __________ seconds. 4. Slowly push with your legs to return to standing. Do not use your hands to pull yourself to standing. Repeat __________ times. Complete this exercise __________ times a day. Exercise J: Wall Slides (Quadriceps)  1. Lean your back against a smooth wall or door while  you walk your feet out 18-24 inches (46-61 cm) from it. 2. Place your feet hip-width apart. 3. Slowly slide down the wall or door until your knees bend __________ degrees. Keep your knees over your heels, not over your toes. Keep your knees in line with your hips. 4. Hold for __________ seconds. Repeat __________ times. Complete this exercise __________ times a day. Exercise K: Straight Leg Raises - Hip Abductors 1. Lie on your side with your left / right leg in the top position. Lie so your head, shoulder, knee, and hip line up. You may bend your bottom knee to help you keep your balance. 2. Roll your hips slightly forward so your hips are stacked directly over each other and your left / right knee is facing forward. 3. Leading with your heel, lift your top leg 4-6 inches (10-15 cm). You should feel the muscles in your outer hip lifting. ? Do not let your foot drift forward. ? Do not let your knee roll toward the ceiling. 4. Hold this position for __________ seconds. 5. Slowly return your leg to the starting  position. 6. Let your muscles relax completely after each repetition. Repeat __________ times. Complete this exercise __________ times a day. Exercise L: Straight Leg Raises - Hip Extensors 1. Lie on your abdomen on a firm surface. You can put a pillow under your hips if that is more comfortable. 2. Tense the muscles in your buttocks and lift your left / right leg about 4-6 inches (10-15 cm). Keep your knee straight as you lift your leg. 3. Hold this position for __________ seconds. 4. Slowly lower your leg to the starting position. 5. Let your leg relax completely after each repetition. Repeat __________ times. Complete this exercise __________ times a day. This information is not intended to replace advice given to you by your health care provider. Make sure you discuss any questions you have with your health care provider. Document Released: 05/23/2005 Document Revised: 04/02/2016 Document Reviewed: 05/15/2015 Elsevier Interactive Patient Education  2018 [place] Exercises Hand exercises can be helpful to almost anyone. These exercises can strengthen the hands, improve flexibility and movement, and increase blood flow to the hands. These results can make work and daily tasks easier. Hand exercises can be especially helpful for people who have joint pain from arthritis or have nerve damage from overuse (carpal tunnel syndrome). These exercises can also help people who have injured a hand. Most of these hand exercises are fairly gentle stretching routines. You can do them often throughout the day. Still, it is a good idea to ask your health care provider which exercises would be best for you. Warming your hands before exercise may help to reduce stiffness. You can do this with gentle massage or by placing your hands in warm water for 15 minutes. Also, make sure you pay attention to your level of hand pain as you begin an exercise routine. Exercises Knuckle Bend Repeat this exercise 5-10  times with each hand. 6. Stand or sit with your arm, hand, and all five fingers pointed straight up. Make sure your wrist is straight. 7. Gently and slowly bend your fingers down and inward until the tips of your fingers are touching the tops of your palm. 8. Hold this position for a few seconds. 9. Extend your fingers out to their original position, all pointing straight up again.  Finger Fan Repeat this exercise 5-10 times with each hand. 5. Hold your arm and hand out in front  of you. Keep your wrist straight. 6. Squeeze your hand into a fist. 7. Hold this position for a few seconds. 8. Edison Simon out, or spread apart, your hand and fingers as much as possible, stretching every joint fully.  Tabletop Repeat this exercise 5-10 times with each hand. 5. Stand or sit with your arm, hand, and all five fingers pointed straight up. Make sure your wrist is straight. 6. Gently and slowly bend your fingers at the knuckles where they meet the hand until your hand is making an upside-down L shape. Your fingers should form a tabletop. 7. Hold this position for a few seconds. 8. Extend your fingers out to their original position, all pointing straight up again.  Making Os Repeat this exercise 5-10 times with each hand. 1. Stand or sit with your arm, hand, and all five fingers pointed straight up. Make sure your wrist is straight. 2. Make an O shape by touching your pointer finger to your thumb. Hold for a few seconds. Then open your hand wide. 3. Repeat this motion with each finger on your hand.  Table Spread Repeat this exercise 5-10 times with each hand. 5. Place your hand on a table with your palm facing down. Make sure your wrist is straight. 6. Spread your fingers out as much as possible. Hold this position for a few seconds. 7. Slide your fingers back together again. Hold for a few seconds.  Ball Grip  Repeat this exercise 10-15 times with each hand. 7. Hold a tennis ball or another soft ball  in your hand. 8. While slowly increasing pressure, squeeze the ball as hard as possible. 9. Squeeze as hard as you can for 3-5 seconds. 10. Relax and repeat.  Wrist Curls Repeat this exercise 10-15 times with each hand. 6. Sit in a chair that has armrests. 7. Hold a light weight in your hand, such as a dumbbell that weighs 1-3 pounds (0.5-1.4 kg). Ask your health care provider what weight would be best for you. 8. Rest your hand just over the end of the chair arm with your palm facing up. 9. Gently pivot your wrist up and down while holding the weight. Do not twist your wrist from side to side.  Contact a health care provider if:  Your hand pain or discomfort gets much worse when you do an exercise.  Your hand pain or discomfort does not improve within 2 hours after you exercise. If you have any of these problems, stop doing these exercises right away. Do not do them again unless your health care provider says that you can. Get help right away if:  You develop sudden, severe hand pain. If this happens, stop doing these exercises right away. Do not do them again unless your health care provider says that you can. This information is not intended to replace advice given to you by your health care provider. Make sure you discuss any questions you have with your health care provider. Document Released: 06/20/2015 Document Revised: 12/15/2015 Document Reviewed: 01/17/2015 Elsevier Interactive Patient Education  2018 [place].  Hip Exercises Ask your health care provider which exercises are safe for you. Do exercises exactly as told by your health care provider and adjust them as directed. It is normal to feel mild stretching, pulling, tightness, or discomfort as you do these exercises, but you should stop right away if you feel sudden pain or your pain gets worse.Do not begin these exercises until told by your health care provider. STRETCHING AND  RANGE OF MOTION EXERCISES These exercises  warm up your muscles and joints and improve the movement and flexibility of your hip. These exercises also help to relieve pain, numbness, and tingling. Exercise A: Hamstrings, Supine  1. Lie on your back. 2. Loop a belt or towel over the ball of your left / rightfoot. The ball of your foot is on the walking surface, right under your toes. 3. Straighten your left / rightknee and slowly pull on the belt to raise your leg. ? Do not let your left / right knee bend while you do this. ? Keep your other leg flat on the floor. ? Raise the left / right leg until you feel a gentle stretch behind your left / right knee or thigh. 4. Hold this position for __________ seconds. 5. Slowly return your leg to the starting position. Repeat __________ times. Complete this stretch __________ times a day. Exercise B: Hip Rotators  1. Lie on your back on a firm surface. 2. Hold your left / right knee with your left / right hand. Hold your ankle with your other hand. 3. Gently pull your left / right knee and rotate your lower leg toward your other shoulder. ? Pull until you feel a stretch in your buttocks. ? Keep your hips and shoulders firmly planted while you do this stretch. 4. Hold this position for __________ seconds. Repeat __________ times. Complete this stretch __________ times a day. Exercise C: V-Sit (Hamstrings and Adductors)  1. Sit on the floor with your legs extended in a large "V" shape. Keep your knees straight during this exercise. 2. Start with your head and chest upright, then bend at your waist to reach for your left foot (position A). You should feel a stretch in your right inner thigh. 3. Hold this position for __________ seconds. Then slowly return to the upright position. 4. Bend at your waist to reach forward (position B). You should feel a stretch behind both of your thighs and knees. 5. Hold this position for __________ seconds. Then slowly return to the upright position. 6. Bend at  your waist to reach for your right foot (position C). You should feel a stretch in your left inner thigh. 7. Hold this position for __________ seconds. Then slowly return to the upright position. Repeat __________ times. Complete this stretch __________ times a day. Exercise D: Lunge (Hip Flexors)  1. Place your left / right knee on the floor and bend your other knee so that is directly over your ankle. You should be half-kneeling. 2. Keep good posture with your head over your shoulders. 3. Tighten your buttocks to point your tailbone downward. This helps your back to keep from arching too much. 4. You should feel a gentle stretch in the front of your left / right thigh and hip. If you do not feel any resistance, slightly slide your other foot forward and then slowly lunge forward so your knee once again lines up over your ankle. 5. Make sure your tailbone continues to point downward. 6. Hold this position for __________ seconds. Repeat __________ times. Complete this stretch __________ times a day. STRENGTHENING EXERCISES These exercises build strength and endurance in your hip. Endurance is the ability to use your muscles for a long time, even after they get tired. Exercise E: Bridge (Hip Extensors)  1. Lie on your back on a firm surface with your knees bent and your feet flat on the floor. 2. Tighten your buttocks muscles and lift your bottom  off the floor until the trunk of your body is level with your thighs. ? Do not arch your back. ? You should feel the muscles working in your buttocks and the back of your thighs. If you do not feel these muscles, slide your feet 1-2 inches (2.5-5 cm) farther away from your buttocks. 3. Hold this position for __________ seconds. 4. Slowly lower your hips to the starting position. 5. Let your muscles relax completely between repetitions. 6. If this exercise is too easy, try doing it with your arms crossed over your chest. Repeat __________ times.  Complete this exercise __________ times a day. Exercise F: Straight Leg Raises - Hip Abductors  1. Lie on your side with your left / right leg in the top position. Lie so your head, shoulder, knee, and hip line up with each other. You may bend your bottom knee to help you balance. 2. Roll your hips slightly forward, so your hips are stacked directly over each other and your left / right knee is facing forward. 3. Leading with your heel, lift your top leg 4-6 inches (10-15 cm). You should feel the muscles in your outer hip lifting. ? Do not let your foot drift forward. ? Do not let your knee roll toward the ceiling. 4. Hold this position for __________ seconds. 5. Slowly return to the starting position. 6. Let your muscles relax completely between repetitions. Repeat __________ times. Complete this exercise __________ times a day. Exercise G: Straight Leg Raises - Hip Adductors  1. Lie on your side with your left / right leg in the bottom position. Lie so your head, shoulder, knee, and hip line up. You may place your upper foot in front to help you balance. 2. Roll your hips slightly forward, so your hips are stacked directly over each other and your left / right knee is facing forward. 3. Tense the muscles in your inner thigh and lift your bottom leg 4-6 inches (10-15 cm). 4. Hold this position for __________ seconds. 5. Slowly return to the starting position. 6. Let your muscles relax completely between repetitions. Repeat __________ times. Complete this exercise __________ times a day. Exercise H: Straight Leg Raises - Quadriceps  1. Lie on your back with your left / right leg extended and your other knee bent. 2. Tense the muscles in the front of your left / right thigh. When you do this, you should see your kneecap slide up or see increased dimpling just above your knee. 3. Tighten these muscles even more and raise your leg 4-6 inches (10-15 cm) off the floor. 4. Hold this position for  __________ seconds. 5. Keep these muscles tense as you lower your leg. 6. Relax the muscles slowly and completely between repetitions. Repeat __________ times. Complete this exercise __________ times a day. Exercise I: Hip Abductors, Standing 1. Tie one end of a rubber exercise band or tubing to a secure surface, such as a table or pole. 2. Loop the other end of the band or tubing around your left / right ankle. 3. Keeping your ankle with the band or tubing directly opposite of the secured end, step away until there is tension in the tubing or band. Hold onto a chair as needed for balance. 4. Lift your left / right leg out to your side. While you do this: ? Keep your back upright. ? Keep your shoulders over your hips. ? Keep your toes pointing forward. ? Make sure to use your hip muscles to lift  your leg. Do not "throw" your leg or tip your body to lift your leg. 5. Hold this position for __________ seconds. 6. Slowly return to the starting position. Repeat __________ times. Complete this exercise __________ times a day. Exercise J: Squats (Quadriceps) 1. Stand in a door frame so your feet and knees are in line with the frame. You may place your hands on the frame for balance. 2. Slowly bend your knees and lower your hips like you are going to sit in a chair. ? Keep your lower legs in a straight-up-and-down position. ? Do not let your hips go lower than your knees. ? Do not bend your knees lower than told by your health care provider. ? If your hip pain increases, do not bend as low. 3. Hold this position for ___________ seconds. 4. Slowly push with your legs to return to standing. Do not use your hands to pull yourself to standing. Repeat __________ times. Complete this exercise __________ times a day. This information is not intended to replace advice given to you by your health care provider. Make sure you discuss any questions you have with your health care provider. Document Released:  07/27/2005 Document Revised: 04/02/2016 Document Reviewed: 07/04/2015 Elsevier Interactive Patient Education  [employer].

## 2017-06-17 NOTE — Telephone Encounter (Signed)
Opened in error

## 2017-06-18 LAB — CBC WITH DIFFERENTIAL/PLATELET
BASOS ABS: 62 {cells}/uL (ref 0–200)
Basophils Relative: 0.9 %
EOS PCT: 1.5 %
Eosinophils Absolute: 104 cells/uL (ref 15–500)
HCT: 39 % (ref 35.0–45.0)
Hemoglobin: 13.2 g/dL (ref 11.7–15.5)
Lymphs Abs: 2981 cells/uL (ref 850–3900)
MCH: 33.7 pg — ABNORMAL HIGH (ref 27.0–33.0)
MCHC: 33.8 g/dL (ref 32.0–36.0)
MCV: 99.5 fL (ref 80.0–100.0)
MONOS PCT: 7.4 %
MPV: 9.3 fL (ref 7.5–12.5)
NEUTROS PCT: 47 %
Neutro Abs: 3243 cells/uL (ref 1500–7800)
Platelets: 224 10*3/uL (ref 140–400)
RBC: 3.92 10*6/uL (ref 3.80–5.10)
RDW: 11.9 % (ref 11.0–15.0)
TOTAL LYMPHOCYTE: 43.2 %
WBC mixed population: 511 cells/uL (ref 200–950)
WBC: 6.9 10*3/uL (ref 3.8–10.8)

## 2017-06-18 LAB — URIC ACID: Uric Acid, Serum: 5.5 mg/dL (ref 2.5–7.0)

## 2017-06-18 LAB — SJOGRENS SYNDROME-A EXTRACTABLE NUCLEAR ANTIBODY: SSA (Ro) (ENA) Antibody, IgG: 1.4 AI — AB

## 2017-06-18 LAB — HEPATIC FUNCTION PANEL
AG Ratio: 1.6 (calc) (ref 1.0–2.5)
ALBUMIN MSPROF: 4.6 g/dL (ref 3.6–5.1)
ALKALINE PHOSPHATASE (APISO): 54 U/L (ref 33–130)
ALT: 13 U/L (ref 6–29)
AST: 14 U/L (ref 10–35)
BILIRUBIN TOTAL: 0.8 mg/dL (ref 0.2–1.2)
Bilirubin, Direct: 0.1 mg/dL (ref 0.0–0.2)
Globulin: 2.8 g/dL (calc) (ref 1.9–3.7)
Indirect Bilirubin: 0.7 mg/dL (calc) (ref 0.2–1.2)
TOTAL PROTEIN: 7.4 g/dL (ref 6.1–8.1)

## 2017-06-18 LAB — ANTI-DNA ANTIBODY, DOUBLE-STRANDED

## 2017-06-18 LAB — ANA: ANA: POSITIVE — AB

## 2017-06-18 LAB — ANGIOTENSIN CONVERTING ENZYME: Angiotensin-Converting Enzyme: 66 U/L (ref 9–67)

## 2017-06-18 LAB — ANTI-NUCLEAR AB-TITER (ANA TITER): ANA Titer 1: 1:40 {titer} — ABNORMAL HIGH

## 2017-06-18 LAB — ANTI-SCLERODERMA ANTIBODY: Scleroderma (Scl-70) (ENA) Antibody, IgG: <1 AI

## 2017-06-18 LAB — C3 AND C4
C3 Complement: 125 mg/dL (ref 83–193)
C4 Complement: 19 mg/dL (ref 15–57)

## 2017-06-18 LAB — ANTI-SMITH ANTIBODY: ENA SM Ab Ser-aCnc: <1 AI

## 2017-06-18 LAB — CYCLIC CITRUL PEPTIDE ANTIBODY, IGG

## 2017-06-18 LAB — RNP ANTIBODY: RIBONUCLEIC PROTEIN(ENA) ANTIBODY, IGG: NEGATIVE AI

## 2017-06-18 LAB — SJOGRENS SYNDROME-B EXTRACTABLE NUCLEAR ANTIBODY: SSB (La) (ENA) Antibody, IgG: <1 AI

## 2017-06-18 NOTE — Progress Notes (Signed)
Will discuss at follow up visit

## 2017-07-11 ENCOUNTER — Telehealth: Payer: Self-pay | Admitting: Rheumatology

## 2017-07-11 NOTE — Telephone Encounter (Signed)
-----   Message from Carole Binning, LPN sent at [id number]/32/9924 11:14 AM EST ----- Regarding: Please schedule patient for new patient follow up Please schedule patient for new patient follow up.

## 2017-07-11 NOTE — Telephone Encounter (Signed)
I tried to contact patient to schedule npt fu appt. There was no answer, and not able to leave a message.

## 2017-07-13 ENCOUNTER — Other Ambulatory Visit: Payer: Self-pay | Admitting: Internal Medicine

## 2017-07-13 DIAGNOSIS — M069 Rheumatoid arthritis, unspecified: Secondary | ICD-10-CM

## 2017-07-19 ENCOUNTER — Other Ambulatory Visit: Payer: Self-pay | Admitting: Family Medicine

## 2017-07-26 ENCOUNTER — Other Ambulatory Visit: Payer: Self-pay | Admitting: Internal Medicine

## 2017-07-26 DIAGNOSIS — M069 Rheumatoid arthritis, unspecified: Secondary | ICD-10-CM

## 2017-08-21 ENCOUNTER — Other Ambulatory Visit: Payer: Self-pay | Admitting: Internal Medicine

## 2017-08-30 ENCOUNTER — Ambulatory Visit (INDEPENDENT_AMBULATORY_CARE_PROVIDER_SITE_OTHER): Payer: Medicare Other | Admitting: Internal Medicine

## 2017-08-30 VITALS — BP 125/80 | HR 82 | Temp 98.1°F | Ht 62.0 in | Wt 157.4 lb

## 2017-08-30 DIAGNOSIS — K219 Gastro-esophageal reflux disease without esophagitis: Secondary | ICD-10-CM | POA: Diagnosis not present

## 2017-08-30 DIAGNOSIS — Z0000 Encounter for general adult medical examination without abnormal findings: Secondary | ICD-10-CM

## 2017-08-30 DIAGNOSIS — H6093 Unspecified otitis externa, bilateral: Secondary | ICD-10-CM | POA: Diagnosis not present

## 2017-08-30 DIAGNOSIS — M2550 Pain in unspecified joint: Secondary | ICD-10-CM | POA: Diagnosis not present

## 2017-08-30 DIAGNOSIS — I10 Essential (primary) hypertension: Secondary | ICD-10-CM | POA: Diagnosis not present

## 2017-08-30 DIAGNOSIS — Z1231 Encounter for screening mammogram for malignant neoplasm of breast: Secondary | ICD-10-CM

## 2017-08-30 DIAGNOSIS — F17200 Nicotine dependence, unspecified, uncomplicated: Secondary | ICD-10-CM | POA: Diagnosis not present

## 2017-08-30 MED ORDER — CETIRIZINE HCL 10 MG PO TABS: 10.0000 mg | ORAL_TABLET | Freq: Every day | ORAL | 5 refills | Status: DC

## 2017-08-30 MED ORDER — BISOPROLOL FUMARATE 5 MG PO TABS: 5.0000 mg | ORAL_TABLET | Freq: Every day | ORAL | 11 refills | Status: DC

## 2017-08-30 MED ORDER — CLOTRIMAZOLE 1 % EX SOLN: 1.0000 "application " | Freq: Two times a day (BID) | CUTANEOUS | 0 refills | Status: DC

## 2017-08-30 MED ORDER — LOSARTAN POTASSIUM 100 MG PO TABS: 100.0000 mg | ORAL_TABLET | Freq: Every day | ORAL | 3 refills | Status: DC

## 2017-08-30 MED ORDER — FAMOTIDINE 20 MG PO TABS: 20.0000 mg | ORAL_TABLET | Freq: Two times a day (BID) | ORAL | 11 refills | Status: DC

## 2017-08-30 NOTE — Patient Instructions (Addendum)
I believe the itching in the ears likely a fungal infection.  I am going to treat you with topical drops that you placed in your ears twice daily once in the morning once at night for 10 days.  Also think that you have fluid in your ear, I want you to take Zyrtec.  Your blood pressure looks great.  Continue on your current medication regimen.  I have changed her reflux regimen to Pepcid please stop Protonix after you have finished up this month and start Pepcid you need to take this medication once in the morning with breakfast and once that dinnertime.

## 2017-08-30 NOTE — Progress Notes (Signed)
   [name] [name] Mo, MD Phone: [phone]  Reason For Visit: Follow up   #CHRONIC HTN: Current Meds -losartan, HCTZ, bisprolol  Reports good compliance, took meds today. Tolerating well, w/o complaints. Denies CP, dyspnea, HA, edema, dizziness / lightheadedness  #Polyarthralgia -She was seen by rheumatology for evaluation on 11/26.  Lab work is obtained as were x-rays which were consistent with severe osteoarthritis.  Patient did not follow-up with rheumatology due to the co-pay.    # Reflux  -She has been on Protonix for over a year.  Previously having significant reflux has improved since being on Protonix.  Patient does have a history of osteopenia.  # Itchy ears  -Has had several weeks of very itchy ears.  She also notes that her ears have felt congested.  She indicates having some decreased hearing in her ears.  She denies any pain.  She denies any fevers.  #Health maintenance -Needs a screening mammogram -Would like to  hold off on Tdap  Past Medical History Reviewed problem list.  Medications- reviewed and updated No additions to family history Social history- patient is a smoker  Objective: BP 125/80 (BP Location: Right Arm, Patient Position: Sitting, Cuff Size: Normal)   Pulse 82   Temp 98.1 F (36.7 C) (Oral)   Ht 5\' 2"  (1.575 m)   Wt 157 lb 6.4 oz (71.4 kg)   SpO2 99%   BMI 28.79 kg/m  Gen: NAD, alert, cooperative with exam Ear: Middle ear effusion noted in the right ear, Slight crusting in bilaterally otitis externa  Cardio: regular rate and rhythm, S1S2 heard, no murmurs appreciated Pulm: clear to auscultation bilaterally, no wheezes, rhonchi or rales Extremities: warm, well perfused, No edema MSK: Normal gait and station Skin: dry, intact, no rashes or lesions   Assessment/Plan: See problem based a/p  Polyarthralgia Evaluated by rheumatology, minimal changes in ANA and anti-Ro unlikely to be significant discussed  with Dr. Mingo Amber.  Likely patients polyarthritis is due to osteoarthritis given negative immunological workup and findings of osteoarthritis noted on x-rays.  No signs of Sojourn syndrome or SLE -Continue Celebrex for now -Unable to pay for diclofenac cream this time -Will need repeat anti-Ro and ANA in 6 months to ensure that there is no further elevation in those labs  Otitis externa Possible fungal infection/ As notable for middle ear effusion  -Will treat with Lotromine drops twice daily for two weeks  - Start Zyrtec   G E R D Hx of Reflux - improved on Protonix, will switch to Pepcid 20 mg BID given hx of osteopenia   HYPERTENSION, BENIGN SYSTEMIC Blood pressure well controlled  Continue losartan, HCTZ, and bisprolol   Smoker Not currently interested in discussing quitting with a provider   Healthcare maintenance Needs a mammogram  Not interested in TDAP

## 2017-09-01 DIAGNOSIS — Z1231 Encounter for screening mammogram for malignant neoplasm of breast: Secondary | ICD-10-CM | POA: Insufficient documentation

## 2017-09-01 NOTE — Assessment & Plan Note (Signed)
Not currently interested in discussing quitting with a provider

## 2017-09-01 NOTE — Assessment & Plan Note (Addendum)
Evaluated by rheumatology, minimal changes in ANA and anti-Ro unlikely to be significant discussed with Dr. Mingo Amber.  Likely patients polyarthritis is due to osteoarthritis given negative immunological workup and findings of osteoarthritis noted on x-rays.  No signs of Sojourn syndrome or SLE -Continue Celebrex for now -Unable to pay for diclofenac cream this time -Will need repeat anti-Ro and ANA in 6 months to ensure that there is no further elevation in those labs

## 2017-09-01 NOTE — Assessment & Plan Note (Signed)
Blood pressure well controlled  Continue losartan, HCTZ, and bisprolol

## 2017-09-01 NOTE — Assessment & Plan Note (Signed)
Hx of Reflux - improved on Protonix, will switch to Pepcid 20 mg BID given hx of osteopenia

## 2017-09-01 NOTE — Assessment & Plan Note (Signed)
Possible fungal infection/ As notable for middle ear effusion  -Will treat with Lotromine drops twice daily for two weeks  - Start Zyrtec

## 2017-09-01 NOTE — Assessment & Plan Note (Signed)
Needs a mammogram  Not interested in TDAP

## 2017-09-16 ENCOUNTER — Encounter: Payer: Self-pay | Admitting: Internal Medicine

## 2017-09-16 ENCOUNTER — Ambulatory Visit (INDEPENDENT_AMBULATORY_CARE_PROVIDER_SITE_OTHER): Payer: Medicare Other | Admitting: Internal Medicine

## 2017-09-16 VITALS — BP 125/78 | HR 84 | Temp 98.2°F | Ht 62.0 in | Wt 154.8 lb

## 2017-09-16 DIAGNOSIS — M159 Polyosteoarthritis, unspecified: Secondary | ICD-10-CM | POA: Diagnosis not present

## 2017-09-16 DIAGNOSIS — L659 Nonscarring hair loss, unspecified: Secondary | ICD-10-CM | POA: Diagnosis not present

## 2017-09-16 MED ORDER — LORATADINE 10 MG PO TABS: 10.0000 mg | ORAL_TABLET | Freq: Every day | ORAL | 11 refills | Status: DC

## 2017-09-16 NOTE — Patient Instructions (Addendum)
It was nice seeing you today.  Continue using the eardrops.  Please follow-up with a dermatologist as soon as possible for your scalp.  And have her look at the lesion on the next your ear.

## 2017-09-16 NOTE — Progress Notes (Signed)
   [name] Kerrin Mo, MD Phone: [phone]  Reason For Visit: Hair loss   #Hair Loss  She presented to discuss hair loss.  She states that she has had significant hair loss.  She states that Dr. Arturo Morton in the past had given her an antifungal medication which did not help significantly and made her hair come out more.  She denies any other symptoms.  She does have joint pains and joint swelling.  Past Medical History Reviewed problem list.  Medications- reviewed and updated No additions to family history Social history- patient is a smoker  Objective: BP 125/78 (BP Location: Left Arm, Patient Position: Sitting, Cuff Size: Normal)   Pulse 84   Temp 98.2 F (36.8 C) (Oral)   Ht 5\' 2"  (1.575 m)   Wt 154 lb 12.8 oz (70.2 kg)   SpO2 98%   BMI 28.31 kg/m  Gen: NAD, alert, cooperative with exam HEENT: Hair loss specifically in spots of head with hypo-pigmentation    Neck: No masses palpated. No lymphadenopathy    Nose: nasal turbinates moist    Throat: moist mucus membranes, no erythema Cardio: regular rate and rhythm, S1S2 heard, no murmurs appreciated Pulm: clear to auscultation bilaterally, no wheezes, rhonchi or rales Skin: dry, intact, no rashes or lesions   Assessment/Plan: See problem based a/p  Hair loss Patient with vitiligo looking hyperpigmentation of her scalp.  Plus significant hair loss.  Will send to dermatology for further workup as I believe this is an autoimmune disorder and not a fungus

## 2017-09-19 DIAGNOSIS — L659 Nonscarring hair loss, unspecified: Secondary | ICD-10-CM | POA: Insufficient documentation

## 2017-09-19 NOTE — Assessment & Plan Note (Signed)
Patient with vitiligo looking hyperpigmentation of her scalp.  Plus significant hair loss.  Will send to dermatology for further workup as I believe this is an autoimmune disorder and not a fungus

## 2017-09-27 ENCOUNTER — Ambulatory Visit
Admission: RE | Admit: 2017-09-27 | Discharge: 2017-09-27 | Disposition: A | Discharge status: Home / Self Care | Payer: Medicare Other | Source: Ambulatory Visit | Attending: Family Medicine | Admitting: Family Medicine

## 2017-09-27 ENCOUNTER — Ambulatory Visit: Payer: Medicare Other

## 2017-09-27 DIAGNOSIS — Z1231 Encounter for screening mammogram for malignant neoplasm of breast: Secondary | ICD-10-CM | POA: Diagnosis not present

## 2017-10-03 ENCOUNTER — Ambulatory Visit (INDEPENDENT_AMBULATORY_CARE_PROVIDER_SITE_OTHER): Payer: Medicare Other | Admitting: Internal Medicine

## 2017-10-03 VITALS — BP 130/70 | HR 75 | Temp 98.6°F | Ht 62.0 in | Wt 156.6 lb

## 2017-10-03 DIAGNOSIS — I10 Essential (primary) hypertension: Secondary | ICD-10-CM

## 2017-10-03 DIAGNOSIS — H6093 Unspecified otitis externa, bilateral: Secondary | ICD-10-CM | POA: Diagnosis not present

## 2017-10-03 DIAGNOSIS — F17200 Nicotine dependence, unspecified, uncomplicated: Secondary | ICD-10-CM

## 2017-10-03 DIAGNOSIS — M069 Rheumatoid arthritis, unspecified: Secondary | ICD-10-CM

## 2017-10-03 MED ORDER — CELECOXIB 50 MG PO CAPS: 50.0000 mg | ORAL_CAPSULE | Freq: Two times a day (BID) | ORAL | 0 refills | Status: DC | PRN

## 2017-10-03 NOTE — Patient Instructions (Addendum)
You can stop the ear drops. Follow up in about 3 months

## 2017-10-03 NOTE — Assessment & Plan Note (Signed)
Itchy ears have resolved  Stopped Lotrimin

## 2017-10-03 NOTE — Progress Notes (Signed)
   [facility] Kerrin Mo, MD Phone: [phone]  Reason For Visit:   #Itchy ears -Patient has been taking Lotrimin drops to help with her itchy ears.  She states since doing this she has not had significant improvement with ear itching.  She denies any more symptoms of this.  Overall she is doing well.  #Tobacco Abuse  Patient has been smoking for many years.  Patient states that she often smokes because of her family particularly her sister and her her daughter.  They both create a lot of stress for her.  She is not willing to quit at this point or discuss quitting right now.  Past Medical History Reviewed problem list.  Medications- reviewed and updated No additions to family history Social history- patient is a non- smoker  Objective: BP 130/70   Pulse 75   Temp 98.6 F (37 C) (Oral)   Ht 5\' 2"  (1.575 m)   Wt 156 lb 9.6 oz (71 kg)   SpO2 96%   BMI 28.64 kg/m  Gen: NAD, alert, cooperative with exam HEENT: Normal    Neck: No masses palpated. No lymphadenopathy    Ears: Tympanic membranes intact, normal light reflex, no erythema, no bulging    Nose: nasal turbinates moist    Throat: moist mucus membranes, no erythema Cardio: regular rate and rhythm, S1S2 heard, no murmurs appreciated Pulm: clear to auscultation bilaterally, no wheezes, rhonchi or rales Skin: dry, intact, no rashes or lesions   Assessment/Plan: See problem based a/p  Smoker Not interested in quitting    Otitis externa Itchy ears have resolved  Stopped Lotrimin

## 2017-10-03 NOTE — Assessment & Plan Note (Signed)
Not interested in quitting 

## 2017-10-07 ENCOUNTER — Other Ambulatory Visit: Payer: Self-pay | Admitting: Family Medicine

## 2017-10-10 ENCOUNTER — Other Ambulatory Visit: Payer: Self-pay | Admitting: Internal Medicine

## 2017-10-27 ENCOUNTER — Other Ambulatory Visit: Payer: Self-pay | Admitting: Family Medicine

## 2017-11-12 ENCOUNTER — Encounter: Payer: Self-pay | Admitting: Gastroenterology

## 2017-11-21 ENCOUNTER — Encounter: Payer: Self-pay | Admitting: Internal Medicine

## 2017-11-21 ENCOUNTER — Ambulatory Visit (INDEPENDENT_AMBULATORY_CARE_PROVIDER_SITE_OTHER): Payer: Medicare Other | Admitting: Internal Medicine

## 2017-11-21 VITALS — BP 127/80 | HR 79 | Temp 99.1°F | Ht 62.0 in | Wt 157.6 lb

## 2017-11-21 DIAGNOSIS — I10 Essential (primary) hypertension: Secondary | ICD-10-CM | POA: Diagnosis not present

## 2017-11-21 DIAGNOSIS — K219 Gastro-esophageal reflux disease without esophagitis: Secondary | ICD-10-CM

## 2017-11-21 MED ORDER — OMEPRAZOLE 40 MG PO CPDR: 40.0000 mg | DELAYED_RELEASE_CAPSULE | Freq: Every day | ORAL | 5 refills | Status: DC

## 2017-11-21 MED ORDER — OMEPRAZOLE 40 MG PO CPDR: 40.0000 mg | DELAYED_RELEASE_CAPSULE | Freq: Every day | ORAL | 3 refills | Status: DC

## 2017-11-21 NOTE — Progress Notes (Signed)
   [name] Kerrin Mo, MD Phone: [phone]  Reason For Visit: Follow up for Blood Pressure   # CHRONIC HTN: Current Meds - Losartan, HCTZ, bisprolol  Reports good compliance, took meds today. Tolerating well, w/o complaints. Denies CP, dyspnea, HA, edema, dizziness / lightheadedness  # GERD  Patient indicates reflux.  She states that she is some cough at night and sensation in her throat.  She was previously on Prilosec and she would like to restart this medication.  We have trialed her on a course of Pepcid however this has not worked for her.  Past Medical History Reviewed problem list.  Medications- reviewed and updated No additions to family history Social history- patient is a  smoker  Objective: BP 127/80 (BP Location: Left Arm, Patient Position: Sitting, Cuff Size: Normal)   Pulse 79   Temp 99.1 F (37.3 C) (Oral)   Ht 5\' 2"  (1.575 m)   Wt 157 lb 9.6 oz (71.5 kg)   SpO2 95%   BMI 28.83 kg/m  Gen: NAD, alert, cooperative with exam Cardio: regular rate and rhythm, S1S2 heard, no murmurs appreciated Pulm: clear to auscultation bilaterally, no wheezes, rhonchi or rales Skin: dry, intact, no rashes or lesions   Assessment/Plan: See problem based a/p  HYPERTENSION, BENIGN SYSTEMIC Blood pressure well controlled  continue current  regiment  G E R D Trialed on Pepcid however patient rebound reflux therefore will restart Prilosec  Discussed pros/cons of PPI

## 2017-11-21 NOTE — Patient Instructions (Addendum)
Your blood pressure looks good. Continue your current regiment. Please follow up 2- 3 months. I have refilled your prilosec.

## 2017-11-24 ENCOUNTER — Encounter: Payer: Self-pay | Admitting: Internal Medicine

## 2017-11-24 NOTE — Assessment & Plan Note (Signed)
Trialed on Pepcid however patient rebound reflux therefore will restart Prilosec  Discussed pros/cons of PPI

## 2017-11-24 NOTE — Assessment & Plan Note (Signed)
Blood pressure well controlled  continue current  regiment

## 2017-11-26 ENCOUNTER — Other Ambulatory Visit: Payer: Self-pay | Admitting: Family Medicine

## 2017-12-06 ENCOUNTER — Other Ambulatory Visit: Payer: Self-pay | Admitting: Family Medicine

## 2017-12-10 ENCOUNTER — Other Ambulatory Visit: Payer: Self-pay

## 2017-12-11 ENCOUNTER — Other Ambulatory Visit: Payer: Self-pay | Admitting: Family Medicine

## 2017-12-11 MED ORDER — PRAVASTATIN SODIUM 40 MG PO TABS: 40.0000 mg | ORAL_TABLET | Freq: Every day | ORAL | 3 refills | Status: DC

## 2017-12-20 ENCOUNTER — Other Ambulatory Visit: Payer: Self-pay | Admitting: Family Medicine

## 2017-12-24 ENCOUNTER — Other Ambulatory Visit: Payer: Self-pay

## 2017-12-24 DIAGNOSIS — M069 Rheumatoid arthritis, unspecified: Secondary | ICD-10-CM

## 2017-12-24 MED ORDER — CILOSTAZOL 100 MG PO TABS: 100.0000 mg | ORAL_TABLET | Freq: Two times a day (BID) | ORAL | 0 refills | Status: DC

## 2017-12-24 MED ORDER — CELECOXIB 50 MG PO CAPS: 50.0000 mg | ORAL_CAPSULE | Freq: Two times a day (BID) | ORAL | 0 refills | Status: DC | PRN

## 2017-12-24 MED ORDER — HYDROCHLOROTHIAZIDE 25 MG PO TABS: 25.0000 mg | ORAL_TABLET | Freq: Every day | ORAL | 3 refills | Status: DC

## 2017-12-24 NOTE — Telephone Encounter (Signed)
Patient called a second time regarding HCTZ. It has been sent to wrong prescriber by pharmacy and pt is out. Refilled per standing order.  Danley Danker, RN [facility] RN)

## 2017-12-26 ENCOUNTER — Other Ambulatory Visit: Payer: Self-pay | Admitting: Internal Medicine

## 2017-12-26 DIAGNOSIS — M069 Rheumatoid arthritis, unspecified: Secondary | ICD-10-CM

## 2018-02-03 ENCOUNTER — Encounter: Payer: Self-pay | Admitting: Gastroenterology

## 2018-03-10 ENCOUNTER — Telehealth: Payer: Self-pay | Admitting: *Deleted

## 2018-03-10 NOTE — Telephone Encounter (Signed)
[name] Page,  Can you please send a letter to Dr Darrelyn Hillock to see about holding her Pletal please ?  Thanks, [name] PV

## 2018-03-10 NOTE — Telephone Encounter (Signed)
If we can get clearance from her PCP to hold Pletal for 7 days without office visits that will suffice. Thank you.

## 2018-03-10 NOTE — Telephone Encounter (Signed)
Dr Fuller Plan,  This pt is scheduled for a PV 8/30 and a colon with you 9/13- she has a hx of GERD, COPD, tachycardia, intermittent claudication and takes Pletal BID- she said her MD is [facility], now Dr Darrelyn Hillock -  Do you want her to see an APP or you prior to her colon or just get With her PCP to hold??  Please advise, thanks for your time, Lelan Pons - pre visit

## 2018-03-10 NOTE — Telephone Encounter (Signed)
   [name] Page 20-Jun-1946 [id number]  Dear Dr. Higinio Plan  :  We have scheduled the above named patient for a(n) Colonoscopy procedure. Our records show that (s)he is on anticoagulation therapy.  Please advise as to whether the patient may come off their therapy of Pletal 7 days prior to their procedure which is scheduled for 04/04/18.  Please route your response to Marlon Pel, CMA.     [facility]

## 2018-03-20 NOTE — Telephone Encounter (Signed)
I apologize for my delayed response. Yes, it should be fine for her to come off her Pletal therapy for 7 days prior to the colonoscopy and re-start afterwards.

## 2018-03-21 ENCOUNTER — Ambulatory Visit (AMBULATORY_SURGERY_CENTER): Payer: Self-pay

## 2018-03-21 VITALS — Ht 62.0 in | Wt 156.2 lb

## 2018-03-21 DIAGNOSIS — Z86010 Personal history of colonic polyps: Secondary | ICD-10-CM

## 2018-03-21 MED ORDER — NA SULFATE-K SULFATE-MG SULF 17.5-3.13-1.6 GM/177ML PO SOLN: 1.0000 | Freq: Once | ORAL | 0 refills | Status: AC

## 2018-03-21 NOTE — Progress Notes (Signed)
Denies allergies to eggs or soy products. Denies complication of anesthesia or sedation. Denies use of weight loss medication. Denies use of O2.   Emmi instructions declined.  

## 2018-03-26 ENCOUNTER — Other Ambulatory Visit: Payer: Self-pay | Admitting: Internal Medicine

## 2018-03-26 DIAGNOSIS — H6093 Unspecified otitis externa, bilateral: Secondary | ICD-10-CM

## 2018-04-02 ENCOUNTER — Other Ambulatory Visit: Payer: Self-pay

## 2018-04-02 ENCOUNTER — Ambulatory Visit: Payer: Medicare Other | Admitting: Family Medicine

## 2018-04-02 ENCOUNTER — Ambulatory Visit (INDEPENDENT_AMBULATORY_CARE_PROVIDER_SITE_OTHER): Payer: Medicare Other | Admitting: Family Medicine

## 2018-04-02 ENCOUNTER — Encounter: Payer: Self-pay | Admitting: Gastroenterology

## 2018-04-02 VITALS — BP 154/72 | HR 84 | Temp 98.6°F | Ht 62.0 in | Wt 155.0 lb

## 2018-04-02 DIAGNOSIS — M069 Rheumatoid arthritis, unspecified: Secondary | ICD-10-CM

## 2018-04-02 DIAGNOSIS — M26622 Arthralgia of left temporomandibular joint: Secondary | ICD-10-CM

## 2018-04-02 DIAGNOSIS — I10 Essential (primary) hypertension: Secondary | ICD-10-CM | POA: Diagnosis not present

## 2018-04-02 MED ORDER — CELECOXIB 50 MG PO CAPS: 50.0000 mg | ORAL_CAPSULE | Freq: Two times a day (BID) | ORAL | 0 refills | Status: DC | PRN

## 2018-04-02 MED ORDER — HYDROCHLOROTHIAZIDE 25 MG PO TABS: 25.0000 mg | ORAL_TABLET | Freq: Two times a day (BID) | ORAL | 3 refills | Status: DC

## 2018-04-02 MED ORDER — CETIRIZINE HCL 10 MG PO TABS: 10.0000 mg | ORAL_TABLET | Freq: Every day | ORAL | 3 refills | Status: DC

## 2018-04-02 MED ORDER — CILOSTAZOL 100 MG PO TABS: 100.0000 mg | ORAL_TABLET | Freq: Two times a day (BID) | ORAL | 0 refills | Status: DC

## 2018-04-02 MED ORDER — LOSARTAN POTASSIUM 100 MG PO TABS: 100.0000 mg | ORAL_TABLET | Freq: Every day | ORAL | 3 refills | Status: DC

## 2018-04-02 NOTE — Progress Notes (Signed)
Subjective: Chief Complaint  Patient presents with  . Follow-up    bp    HPI: [name] Page is a 72 y.o. presenting to clinic today to discuss the following:  HTN Follow Up Patient did not bring in her BP diary from home but state she has been checking it in the mornings and evenings. She states in the am it usually in the 161W systolic and 96E diastolic. In the evenings she states it runs a little higher with 454U systolic and 98-11B diastolic. She is compliant with her medications.  TMJ Patient states recently she has been getting headaches in the back of the left side of her head and when she does she gets associated ear pain. Patient also endorses pain on eating that makes her stop when she has to chew foods extensively that also radiates to the ear. Patient has not noticed any popping when opening her mouth and denies any "catching" of her jaw.  Health Maintenance: none     ROS noted in HPI.   Past Medical, Surgical, Social, and Family History Reviewed & Updated per EMR.   Pertinent Historical Findings include:   Social History   Tobacco Use  Smoking Status Current Every Day Smoker  . Packs/day: 0.25  . Years: 40.00  . Pack years: 10.00  . Types: Cigarettes  Smokeless Tobacco Never Used  Tobacco Comment   working on quitting    Objective: BP (!) 154/72   Pulse 84   Temp 98.6 F (37 C) (Oral)   Ht 5\' 2"  (1.575 m)   Wt 155 lb (70.3 kg)   SpO2 94%   BMI 28.35 kg/m  Vitals and nursing notes reviewed  Physical Exam Gen: Alert and Oriented x 3, NAD HEENT: Normocephalic, atraumatic, PERRLA, EOMI, TM visible with good light reflex, non-swollen, non-erythematous turbinates, non-erythematous pharyngeal mucosa, no exudates; noticeable popping when opening of the jaw, TMJ is non-tender to palpation but can palpate crepitus at TMJ CV: RRR, 2/6 systolic murmur heard best at RUSB, normal S1, S2 split Resp: CTAB, no wheezing, rales, or rhonchi, comfortable work  of breathing Abd: non-distended, non-tender, soft, +bs in all four quadrants MSK: Moves all four extremities Ext: no clubbing, cyanosis, or edema Skin: warm, dry, intact, no rashes  No results found for this or any previous visit (from the past 72 hour(s)).  Assessment/Plan:  HYPERTENSION, BENIGN SYSTEMIC BP slightly above goal at today's visit and patient self reports some consistent readings of BP over 150 at night. Increasing HCTZ from 25 to 50mg  daily and will recheck BMP today as it has been one year since we have gotten labs.   Follow up in one month and repeat BMP to check for potassium levels.  Arthralgia of left temporomandibular joint Patient is having typical presentation of TMJ which includes headache, ear ache unilateral on the affected side and pain with eating. Given exercises to do at home to help alleviate pain and patient can have Celecoxib and Tylenol.  If no improvement consider referral to PT and cyclobenzaprine 10mg  daily at night. If she continues to have pain after this could consider amitriptyline for up to 4 months.    PATIENT EDUCATION PROVIDED: See AVS    Diagnosis and plan along with any newly prescribed medication(s) were discussed in detail with this patient today. The patient verbalized understanding and agreed with the plan. Patient advised if symptoms worsen return to clinic or ER.   Health Maintainance:   Orders Placed This Encounter  Procedures  . Basic Metabolic Panel    Meds ordered this encounter  Medications  . celecoxib (CELEBREX) 50 MG capsule    Sig: Take 1 capsule (50 mg total) by mouth 2 (two) times daily as needed.    Dispense:  180 capsule    Refill:  0  . cilostazol (PLETAL) 100 MG tablet    Sig: Take 1 tablet (100 mg total) by mouth 2 (two) times daily.    Dispense:  180 tablet    Refill:  0  . cetirizine (ZYRTEC) 10 MG tablet    Sig: Take 1 tablet (10 mg total) by mouth daily.    Dispense:  90 tablet    Refill:  3  .  losartan (COZAAR) 100 MG tablet    Sig: Take 1 tablet (100 mg total) by mouth daily.    Dispense:  90 tablet    Refill:  3  . hydrochlorothiazide (HYDRODIURIL) 25 MG tablet    Sig: Take 1 tablet (25 mg total) by mouth 2 (two) times daily.    Dispense:  90 tablet    Refill:  [place], DO 04/02/2018, 1:29 PM PGY-2 [place]

## 2018-04-02 NOTE — Assessment & Plan Note (Signed)
Patient is having typical presentation of TMJ which includes headache, ear ache unilateral on the affected side and pain with eating. Given exercises to do at home to help alleviate pain and patient can have Celecoxib and Tylenol.  If no improvement consider referral to PT and cyclobenzaprine 10mg  daily at night. If she continues to have pain after this could consider amitriptyline for up to 4 months.

## 2018-04-02 NOTE — Assessment & Plan Note (Signed)
BP slightly above goal at today's visit and patient self reports some consistent readings of BP over 150 at night. Increasing HCTZ from 25 to 50mg  daily and will recheck BMP today as it has been one year since we have gotten labs.   Follow up in one month and repeat BMP to check for potassium levels.

## 2018-04-02 NOTE — Patient Instructions (Addendum)
It was great to meet you today! Thank you for letting me participate in your care!  Today, we discussed your high blood pressure. Please remember to bring in your blood pressure diary at your next visit. I have increased your HCTZ from 25mg  to 50mg .   Temporomandibular Joint Syndrome Temporomandibular joint (TMJ) syndrome is a condition that affects the joints between your jaw and your skull. The TMJs are located near your ears and allow your jaw to open and close. These joints and the nearby muscles are involved in all movements of the jaw. People with TMJ syndrome have pain in the area of these joints and muscles. Chewing, biting, or other movements of the jaw can be difficult or painful. TMJ syndrome can be caused by various things. In many cases, the condition is mild and goes away within a few weeks. For some people, the condition can become a long-term problem. What are the causes? Possible causes of TMJ syndrome include:  Grinding your teeth or clenching your jaw. Some people do this when they are under stress.  Arthritis.  Injury to the jaw.  Head or neck injury.  Teeth or dentures that are not aligned well.  In some cases, the cause of TMJ syndrome may not be known. What are the signs or symptoms? The most common symptom is an aching pain on the side of the head in the area of the TMJ. Other symptoms may include:  Pain when moving your jaw, such as when chewing or biting.  Being unable to open your jaw all the way.  Making a clicking sound when you open your mouth.  Headache.  Earache.  Neck or shoulder pain.  How is this diagnosed? Diagnosis can usually be made based on your symptoms, your medical history, and a physical exam. Your health care provider may check the range of motion of your jaw. Imaging tests, such as X-rays or an MRI, are sometimes done. You may need to see your dentist to determine if your teeth and jaw are lined up correctly. How is this  treated? TMJ syndrome often goes away on its own. If treatment is needed, the options may include:  Eating soft foods and applying ice or heat.  Medicines to relieve pain or inflammation.  Medicines to relax the muscles.  A splint, bite plate, or mouthpiece to prevent teeth grinding or jaw clenching.  Relaxation techniques or counseling to help reduce stress.  Transcutaneous electrical nerve stimulation (TENS). This helps to relieve pain by applying an electrical current through the skin.  Acupuncture. This is sometimes helpful to relieve pain.  Jaw surgery. This is rarely needed.  Follow these instructions at home:  Take medicines only as directed by your health care provider.  Eat a soft diet if you are having trouble chewing.  Apply ice to the painful area. ? Put ice in a plastic bag. ? Place a towel between your skin and the bag. ? Leave the ice on for 20 minutes, 2-3 times a day.  Apply a warm compress to the painful area as directed.  Massage your jaw area and perform any jaw stretching exercises as recommended by your health care provider.  If you were given a mouthpiece or bite plate, wear it as directed.  Avoid foods that require a lot of chewing. Do not chew gum.  Keep all follow-up visits as directed by your health care provider. This is important. Contact a health care provider if:  You are having trouble eating.  You have new or worsening symptoms. Get help right away if:  Your jaw locks open or closed. This information is not intended to replace advice given to you by your health care provider. Make sure you discuss any questions you have with your health care provider. Document Released: 04/03/2001 Document Revised: 03/08/2016 Document Reviewed: 02/11/2014 Elsevier Interactive Patient Education  2018 [place] [facility], Harolyn Rutherford, DO PGY-2, [facility]

## 2018-04-03 LAB — BASIC METABOLIC PANEL
BUN/Creatinine Ratio: 11 — ABNORMAL LOW (ref 12–28)
BUN: 8 mg/dL (ref 8–27)
CALCIUM: 9.9 mg/dL (ref 8.7–10.3)
CO2: 24 mmol/L (ref 20–29)
Chloride: 93 mmol/L — ABNORMAL LOW (ref 96–106)
Creatinine, Ser: 0.73 mg/dL (ref 0.57–1.00)
GFR calc Af Amer: 95 mL/min/{1.73_m2} (ref >59)
GFR calc non Af Amer: 83 mL/min/{1.73_m2} (ref >59)
GLUCOSE: 106 mg/dL — AB (ref 65–99)
POTASSIUM: 3.8 mmol/L (ref 3.5–5.2)
Sodium: 135 mmol/L (ref 134–144)

## 2018-04-04 ENCOUNTER — Encounter: Payer: Medicare Other | Admitting: Gastroenterology

## 2018-04-04 ENCOUNTER — Encounter: Payer: Self-pay | Admitting: Family Medicine

## 2018-04-04 NOTE — Progress Notes (Signed)
Normal results

## 2018-04-16 ENCOUNTER — Encounter: Payer: Self-pay | Admitting: Gastroenterology

## 2018-04-16 ENCOUNTER — Ambulatory Visit (AMBULATORY_SURGERY_CENTER): Payer: Medicare Other | Admitting: Gastroenterology

## 2018-04-16 VITALS — BP 170/78 | HR 62 | Temp 96.8°F | Resp 22 | Ht 62.0 in | Wt 156.0 lb

## 2018-04-16 DIAGNOSIS — Z800 Family history of malignant neoplasm of digestive organs: Secondary | ICD-10-CM

## 2018-04-16 DIAGNOSIS — Z86010 Personal history of colonic polyps: Secondary | ICD-10-CM

## 2018-04-16 DIAGNOSIS — Z1211 Encounter for screening for malignant neoplasm of colon: Secondary | ICD-10-CM | POA: Diagnosis not present

## 2018-04-16 MED ORDER — SODIUM CHLORIDE 0.9 % IV SOLN: 500.0000 mL | Freq: Once | INTRAVENOUS | Status: DC

## 2018-04-16 NOTE — Op Note (Addendum)
[facility] Patient Name: [name] Page Procedure Date: 04/16/2018 3:13 PM MRN: [id number] Endoscopist: Ladene Artist , MD Age: 72 Referring MD:  Date of Birth: 26-Mar-1946 Gender: Female Account #: [id number] Procedure:                Colonoscopy Indications:              Surveillance: Personal history of adenomatous                            polyps on last colonoscopy 5 years ago. Family                            history of colon cancer. Medicines:                Monitored Anesthesia Care Procedure:                Pre-Anesthesia Assessment:                           - Prior to the procedure, a History and Physical                            was performed, and patient medications and                            allergies were reviewed. The patient's tolerance of                            previous anesthesia was also reviewed. The risks                            and benefits of the procedure and the sedation                            options and risks were discussed with the patient.                            All questions were answered, and informed consent                            was obtained. Prior Anticoagulants: The patient has                            taken Pletal (cilostazol), last dose was 7 days                            prior to procedure. ASA Grade Assessment: II - A                            patient with mild systemic disease. After reviewing                            the risks and benefits, the patient was deemed in  satisfactory condition to undergo the procedure.                           After obtaining informed consent, the colonoscope                            was passed under direct vision. Throughout the                            procedure, the patient's blood pressure, pulse, and                            oxygen saturations were monitored continuously. The                            Colonoscope was introduced  through the anus and                            advanced to the the cecum, identified by                            appendiceal orifice and ileocecal valve. The                            ileocecal valve, appendiceal orifice, and rectum                            were photographed. The quality of the bowel                            preparation was good. The colonoscopy was performed                            without difficulty. The patient tolerated the                            procedure well. Scope In: 3:24:39 PM Scope Out: 3:37:40 PM Scope Withdrawal Time: 0 hours 10 minutes 29 seconds  Total Procedure Duration: 0 hours 13 minutes 1 second  Findings:                 The perianal and digital rectal examinations were                            normal.                           The entire examined colon appeared normal on direct                            and retroflexion views. Complications:            No immediate complications. Estimated blood loss:                            None. Estimated Blood Loss:  Estimated blood loss: none. Impression:               - The entire examined colon is normal on direct and                            retroflexion views.                           - No specimens collected. Recommendation:           - Repeat colonoscopy in 5 years for surveillance.                           - Patient has a contact number available for                            emergencies. The signs and symptoms of potential                            delayed complications were discussed with the                            patient. Return to normal activities tomorrow.                            Written discharge instructions were provided to the                            patient.                           - Resume previous diet.                           - Continue present medications.                           - Resume Pletal (cilostazol) at prior dose today.                             Refer to managing physician for further adjustment                            of therapy. Ladene Artist, MD 04/16/2018 3:39:50 PM This report has been signed electronically.

## 2018-04-16 NOTE — Patient Instructions (Addendum)
Continue present medications. Resume previous diet. Resume Pletal at prior dose today. Refer to managing physician for further adjustment of therapy.    YOU HAD AN ENDOSCOPIC PROCEDURE TODAY AT [facility]:   Refer to the procedure report that was given to you for any specific questions about what was found during the examination.  If the procedure report does not answer your questions, please call your gastroenterologist to clarify.  If you requested that your care partner not be given the details of your procedure findings, then the procedure report has been included in a sealed envelope for you to review at your convenience later.  YOU SHOULD EXPECT: Some feelings of bloating in the abdomen. Passage of more gas than usual.  Walking can help get rid of the air that was put into your GI tract during the procedure and reduce the bloating. If you had a lower endoscopy (such as a colonoscopy or flexible sigmoidoscopy) you may notice spotting of blood in your stool or on the toilet paper. If you underwent a bowel prep for your procedure, you may not have a normal bowel movement for a few days.  Please Note:  You might notice some irritation and congestion in your nose or some drainage.  This is from the oxygen used during your procedure.  There is no need for concern and it should clear up in a day or so.  SYMPTOMS TO REPORT IMMEDIATELY:   Following lower endoscopy (colonoscopy or flexible sigmoidoscopy):  Excessive amounts of blood in the stool  Significant tenderness or worsening of abdominal pains  Swelling of the abdomen that is new, acute  Fever of 100F or higher   For urgent or emergent issues, a gastroenterologist can be reached at any hour by calling [phone].   DIET:  We do recommend a small meal at first, but then you may proceed to your regular diet.  Drink plenty of fluids but you should avoid alcoholic beverages for 24 hours.  ACTIVITY:  You should plan to  take it easy for the rest of today and you should NOT DRIVE or use heavy machinery until tomorrow (because of the sedation medicines used during the test).    FOLLOW UP: Our staff will call the number listed on your records the next business day following your procedure to check on you and address any questions or concerns that you may have regarding the information given to you following your procedure. If we do not reach you, we will leave a message.  However, if you are feeling well and you are not experiencing any problems, there is no need to return our call.  We will assume that you have returned to your regular daily activities without incident.  If any biopsies were taken you will be contacted by phone or by letter within the next 1-3 weeks.  Please call us at [phone] if you have not heard about the biopsies in 3 weeks.    SIGNATURES/CONFIDENTIALITY: You and/or your care partner have signed paperwork which will be entered into your electronic medical record.  These signatures attest to the fact that that the information above on your After Visit Summary has been reviewed and is understood.  Full responsibility of the confidentiality of this discharge information lies with you and/or your care-partner.YOU HAD AN ENDOSCOPIC PROCEDURE TODAY AT [facility]:   Refer to the procedure report that was given to you for any specific questions about what was found during the  examination.  If the procedure report does not answer your questions, please call your gastroenterologist to clarify.  If you requested that your care partner not be given the details of your procedure findings, then the procedure report has been included in a sealed envelope for you to review at your convenience later.  YOU SHOULD EXPECT: Some feelings of bloating in the abdomen. Passage of more gas than usual.  Walking can help get rid of the air that was put into your GI tract during the procedure and reduce  the bloating. If you had a lower endoscopy (such as a colonoscopy or flexible sigmoidoscopy) you may notice spotting of blood in your stool or on the toilet paper. If you underwent a bowel prep for your procedure, you may not have a normal bowel movement for a few days.  Please Note:  You might notice some irritation and congestion in your nose or some drainage.  This is from the oxygen used during your procedure.  There is no need for concern and it should clear up in a day or so.  SYMPTOMS TO REPORT IMMEDIATELY:   Following lower endoscopy (colonoscopy or flexible sigmoidoscopy):  Excessive amounts of blood in the stool  Significant tenderness or worsening of abdominal pains  Swelling of the abdomen that is new, acute  Fever of 100F or higher    For urgent or emergent issues, a gastroenterologist can be reached at any hour by calling [phone].   DIET:  We do recommend a small meal at first, but then you may proceed to your regular diet.  Drink plenty of fluids but you should avoid alcoholic beverages for 24 hours.  ACTIVITY:  You should plan to take it easy for the rest of today and you should NOT DRIVE or use heavy machinery until tomorrow (because of the sedation medicines used during the test).    FOLLOW UP: Our staff will call the number listed on your records the next business day following your procedure to check on you and address any questions or concerns that you may have regarding the information given to you following your procedure. If we do not reach you, we will leave a message.  However, if you are feeling well and you are not experiencing any problems, there is no need to return our call.  We will assume that you have returned to your regular daily activities without incident.  If any biopsies were taken you will be contacted by phone or by letter within the next 1-3 weeks.  Please call us at [phone] if you have not heard about the biopsies in 3 weeks.     SIGNATURES/CONFIDENTIALITY: You and/or your care partner have signed paperwork which will be entered into your electronic medical record.  These signatures attest to the fact that that the information above on your After Visit Summary has been reviewed and is understood.  Full responsibility of the confidentiality of this discharge information lies with you and/or your care-partner.YOU HAD AN ENDOSCOPIC PROCEDURE TODAY AT [facility]:   Refer to the procedure report that was given to you for any specific questions about what was found during the examination.  If the procedure report does not answer your questions, please call your gastroenterologist to clarify.  If you requested that your care partner not be given the details of your procedure findings, then the procedure report has been included in a sealed envelope for you to review at your convenience later.  YOU  SHOULD EXPECT: Some feelings of bloating in the abdomen. Passage of more gas than usual.  Walking can help get rid of the air that was put into your GI tract during the procedure and reduce the bloating. If you had a lower endoscopy (such as a colonoscopy or flexible sigmoidoscopy) you may notice spotting of blood in your stool or on the toilet paper. If you underwent a bowel prep for your procedure, you may not have a normal bowel movement for a few days.  Please Note:  You might notice some irritation and congestion in your nose or some drainage.  This is from the oxygen used during your procedure.  There is no need for concern and it should clear up in a day or so.  SYMPTOMS TO REPORT IMMEDIATELY:   Following lower endoscopy (colonoscopy or flexible sigmoidoscopy):  Excessive amounts of blood in the stool  Significant tenderness or worsening of abdominal pains  Swelling of the abdomen that is new, acute  Fever of 100F or higher    For urgent or emergent issues, a gastroenterologist can be reached at any hour  by calling [phone].   DIET:  We do recommend a small meal at first, but then you may proceed to your regular diet.  Drink plenty of fluids but you should avoid alcoholic beverages for 24 hours.  ACTIVITY:  You should plan to take it easy for the rest of today and you should NOT DRIVE or use heavy machinery until tomorrow (because of the sedation medicines used during the test).    FOLLOW UP: Our staff will call the number listed on your records the next business day following your procedure to check on you and address any questions or concerns that you may have regarding the information given to you following your procedure. If we do not reach you, we will leave a message.  However, if you are feeling well and you are not experiencing any problems, there is no need to return our call.  We will assume that you have returned to your regular daily activities without incident.  If any biopsies were taken you will be contacted by phone or by letter within the next 1-3 weeks.  Please call us at [phone] if you have not heard about the biopsies in 3 weeks.    SIGNATURES/CONFIDENTIALITY: You and/or your care partner have signed paperwork which will be entered into your electronic medical record.  These signatures attest to the fact that that the information above on your After Visit Summary has been reviewed and is understood.  Full responsibility of the confidentiality of this discharge information lies with you and/or your care-partner.

## 2018-04-16 NOTE — Progress Notes (Signed)
To PACU, VSS. Report to [email or web address] 

## 2018-04-17 ENCOUNTER — Telehealth: Payer: Self-pay | Admitting: *Deleted

## 2018-04-17 NOTE — Telephone Encounter (Signed)
  Follow up Call-  Call back number 04/16/2018  Post procedure Call Back phone  # [phone]  Permission to leave phone message Yes  Some recent data might be hidden     Patient questions:  Do you have a fever, pain , or abdominal swelling? No. Pain Score  0 *  Have you tolerated food without any problems? Yes.    Have you been able to return to your normal activities? Yes.    Do you have any questions about your discharge instructions: Diet   No. Medications  No. Follow up visit  No.  Do you have questions or concerns about your Care? No.  Actions: * If pain score is 4 or above: No action needed, pain <4.

## 2018-05-06 ENCOUNTER — Encounter: Payer: Self-pay | Admitting: Family Medicine

## 2018-05-06 ENCOUNTER — Ambulatory Visit (INDEPENDENT_AMBULATORY_CARE_PROVIDER_SITE_OTHER): Payer: Medicare Other | Admitting: Family Medicine

## 2018-05-06 ENCOUNTER — Other Ambulatory Visit: Payer: Self-pay

## 2018-05-06 VITALS — BP 142/68 | HR 60 | Temp 97.9°F | Ht 62.0 in | Wt 156.2 lb

## 2018-05-06 DIAGNOSIS — Z23 Encounter for immunization: Secondary | ICD-10-CM | POA: Diagnosis not present

## 2018-05-06 DIAGNOSIS — I10 Essential (primary) hypertension: Secondary | ICD-10-CM | POA: Diagnosis not present

## 2018-05-06 MED ORDER — AMLODIPINE BESYLATE 5 MG PO TABS: 5.0000 mg | ORAL_TABLET | Freq: Every day | ORAL | 0 refills | Status: DC

## 2018-05-06 NOTE — Patient Instructions (Signed)
It was so nice meeting you today! We added on amlodipine, blood pressure medication, to help with your BP. Continue keeping a log of your pressures and start adding walking back into your routine! We also switched your aspirin to 81 mg daily and changed your omeprazole to an as needed basis. I'll see you back in 1 month to check up on your blood pressure. Have a beautiful day!

## 2018-05-06 NOTE — Progress Notes (Signed)
   Subjective:    Patient ID: [name] Page, female    DOB: 06-23-1946, 72 y.o.   MRN: [id number]   CC: BP and TMJ F/u   HPI: [name] Page is a 72 year old female presenting to discuss the following:   Hypertension: Losartan 100mg  and HCTZ 25 daily for BP. Recently seen 1 month ago with elevated pressures, her HCTZ was increased to 50mg  at that time. She took this increased dose for only 4 days, stopped due to dizziness. BP has been ranging 588-325'Q systolic since last visit. Denies any HA, lightheadedness, dizziness, or visual changes. Has a well balanced diet, plenty of veggies and fruit. Used to walk daily, however no longer doing much activity as she is getting ready to move to a new apartment at the beginning of the year.     TMJ: Also recently seen for TMJ pain, given home exercises to do. She has been doing these daily, and no longer has any concerns.     Smoking status reviewed  Review of Systems Per HPI, also denies recent illness, fever, headache, changes in vision, chest pain, shortness of breath, abdominal pain, N/V/D, weakness   Patient Active Problem List   Diagnosis Date Noted  . Arthralgia of left temporomandibular joint 04/02/2018  . Hair loss 09/19/2017  . Screening mammogram, encounter for 09/01/2017  . Screening for viral disease 04/17/2017  . Osteopenia 11/04/2014  . Healthcare maintenance 05/17/2014  . Polyarthralgia 10/17/2012  . Sinus tachycardia 12/10/2011  . COPD (chronic obstructive pulmonary disease) ([place]) 10/01/2011  . Restrictive lung disease 10/01/2011  . ACNE VULGARIS, CYSTIC, PUSTULAR 03/28/2010  . G E R D 11/20/2006  . HYPERCHOLESTEROLEMIA 09/19/2006  . Smoker 09/19/2006  . HYPERTENSION, BENIGN SYSTEMIC 09/19/2006  . Coronary atherosclerosis 09/19/2006  . CLAUDICATION, INTERMITTENT 09/19/2006  . Allergic rhinitis 09/19/2006     Objective:  BP (!) 142/68   Pulse 60   Temp 97.9 F (36.6 C) (Oral)   Ht 5\' 2"  (1.575 m)   Wt 70.9 kg    SpO2 99%   BMI 28.57 kg/m  Vitals and nursing note reviewed  General: NAD, pleasant Cardiac: RRR, normal heart sounds, slight 2/6 systolic murmur  Respiratory: CTAB, normal effort Abdomen: soft, nontender, nondistended Extremities: no edema or cyanosis. WWP. Skin: warm and dry, no rashes noted Neuro: alert and oriented, no focal deficits Psych: normal affect  Assessment & Plan:    HYPERTENSION, BENIGN SYSTEMIC BP continues to be above goal. 142/68 in the office today. Will start amlodipine 5mg  daily (pt to start with 2.5 mg daily for the next 1-2 weeks as she is nervous about any side effects). Discussed major side effects she should be aware of. Also discussed re-adding physical activity into her daily routine to help manage her BP.  -She is to continue keeping her BP journal and bring this at next visit -F/u in 1 month to monitor BP   In addition, during med rec, transitioned her aspirin 325mg  for secondary prevention to 81mg . Also switched her omeprazole to PRN from daily (has been taking daily since June) given increased risk for bone density concerns and interactions with cilostazol.   Received flu shot today.   Darrelyn Hillock, DO Family Medicine Resident PGY-1

## 2018-05-07 ENCOUNTER — Encounter: Payer: Self-pay | Admitting: Family Medicine

## 2018-05-07 NOTE — Assessment & Plan Note (Addendum)
BP continues to be above goal. 142/68 in the office today. Will start amlodipine 5mg  daily (pt to start with 2.5 mg daily for the next 1-2 weeks as she is nervous about any side effects). Discussed major side effects she should be aware of. Also discussed re-adding physical activity into her daily routine to help manage her BP.  -She is to continue keeping her BP journal and bring this at next visit -F/u in 1 month to monitor BP

## 2018-06-03 ENCOUNTER — Ambulatory Visit (INDEPENDENT_AMBULATORY_CARE_PROVIDER_SITE_OTHER): Payer: Medicare Other | Admitting: Family Medicine

## 2018-06-03 ENCOUNTER — Encounter: Payer: Self-pay | Admitting: Family Medicine

## 2018-06-03 ENCOUNTER — Other Ambulatory Visit: Payer: Self-pay

## 2018-06-03 DIAGNOSIS — F17200 Nicotine dependence, unspecified, uncomplicated: Secondary | ICD-10-CM

## 2018-06-03 DIAGNOSIS — B9789 Other viral agents as the cause of diseases classified elsewhere: Secondary | ICD-10-CM

## 2018-06-03 DIAGNOSIS — J069 Acute upper respiratory infection, unspecified: Secondary | ICD-10-CM | POA: Insufficient documentation

## 2018-06-03 DIAGNOSIS — I10 Essential (primary) hypertension: Secondary | ICD-10-CM | POA: Diagnosis not present

## 2018-06-03 MED ORDER — AMLODIPINE BESYLATE 5 MG PO TABS: 5.0000 mg | ORAL_TABLET | Freq: Every day | ORAL | 3 refills | Status: DC

## 2018-06-03 NOTE — Progress Notes (Signed)
Subjective:    Patient ID: [name] Page, female    DOB: 10/14/1945, 72 y.o.   MRN: [id number]   CC: BP follow up   HPI:  Hypertension  Patient here for follow-up of uncontrolled arterial hypertension. Blood pressure is borderline control at home, around SBP mid 140's on average. She is not exercising and is adherent to a low-salt diet. She is moving to a new place in Jan, so is overwhelmed with packing her house, states she has limited time for exercise. She currently takes norvasc 5mg , HCTZ 25mg , and losartan 100mg  and is adherent to regimen.  Cardiac symptoms: none. Patient denies: chest pain, chest pressure/discomfort, dyspnea, exertional chest pressure/discomfort, irregular heart beat, lower extremity edema and near-syncope. Cardiovascular risk factors: advanced age (older than 62 for men, 15 for women), hypertension, sedentary lifestyle and smoking/ tobacco exposure. Use of agents associated with hypertension: none. History of target organ damage: angina/ prior myocardial infarction and peripheral artery disease. She endorses she has been taught to use her cuff appropriately in the past and is rested prior to checking.   Cough: Endorses an intermittent dry cough, rhinorrhea, and morning clear sputum production (only first cough of the morning- no further) for the past week. She feels she has a cold and started on when it got colder outside. Not associated with time of day or meals. She has been using some OTC cough medicine that has been helping and resting inside. Previously used an albuterol inhaler years ago with bronchitis, felt like it made her worse. Denies any fever, sore throat, sinus congestion, SOB, wheezing, or chest pain.    Smoking status reviewed  Review of Systems Per HPI, also denies fever, headache, changes in vision, chest pain, shortness of breath, abdominal pain, N/V/D, weakness   Patient Active Problem List   Diagnosis Date Noted  . Viral URI with cough  06/03/2018  . Hair loss 09/19/2017  . Osteopenia 11/04/2014  . Polyarthralgia 10/17/2012  . Sinus tachycardia 12/10/2011  . COPD (chronic obstructive pulmonary disease) ([place]) 10/01/2011  . Restrictive lung disease 10/01/2011  . ACNE VULGARIS, CYSTIC, PUSTULAR 03/28/2010  . G E R D 11/20/2006  . HYPERCHOLESTEROLEMIA 09/19/2006  . Smoker 09/19/2006  . HYPERTENSION, BENIGN SYSTEMIC 09/19/2006  . Coronary atherosclerosis 09/19/2006  . CLAUDICATION, INTERMITTENT 09/19/2006     Objective:  BP 125/60   Pulse 76   Temp 98 F (36.7 C) (Oral)   Wt 152 lb (68.9 kg)   SpO2 99%   BMI 27.80 kg/m  Vitals and nursing note reviewed  General: NAD, pleasant Cardiac: RRR, normal heart sounds, no murmurs HEENT: Nasal mucosa slightly boggy, TM clear, no frontal sinus discomfort with palpation  Respiratory: CTAB, normal effort Abdomen: soft, nontender, nondistended, normoactive BS  Extremities: no edema or cyanosis. WWP. Skin: warm and dry, no rashes noted Neuro: alert and oriented, no focal deficits Psych: normal affect  Assessment & Plan:    HYPERTENSION, BENIGN SYSTEMIC Well controlled in office today, 125/60, however on home blood pressure log is generally running around SBP 140's. Discussed increasing her physical activity (has treadmill, recommended small bouts of walking to fit into her busy schedule with packing) and continuing to work on smoking cessation.  -Cr 0.79 in 03/2018  -Continue on current regimen, follow up in 1 month with blood pressure log and cuff to ensure appropriate measurements.   Viral URI with cough Symptoms suggestive of viral URI, no alarm symptoms. Physical exam benign. Discussed conservative treatment (tea with honey, hot showers)  at this time as she already feels she is improving, however stated I could prescribe a nasal spray (flonase or Astelin) if she felt she needed if no further improvement/worsening of symptoms.  -Discussed return precautions at length       Smoker 1/2 ppd smoker, hasn't smoked in the last 3 days due to being busy with packing. Discussed her new year's goal of quitting smoking entirely by Jan 2020. Patient is wanting to try this on her own, aware we have further options to help her quit if needed.    F/u in 1 month for BP  Darrelyn Hillock, DO Family Medicine Resident PGY-1

## 2018-06-03 NOTE — Assessment & Plan Note (Signed)
Symptoms suggestive of viral URI, no alarm symptoms. Physical exam benign. Discussed conservative treatment (tea with honey, hot showers) at this time as she already feels she is improving, however stated I could prescribe a nasal spray (flonase or Astelin) if she felt she needed if no further improvement/worsening of symptoms.  -Discussed return precautions at length

## 2018-06-03 NOTE — Patient Instructions (Signed)
It was so nice seeing you today! Please continue taking your blood pressure daily, and bring your log and blood pressure cuff on your follow up visit. We will keep your blood pressure medications as is for now. For your runny nose/coughing, you may try some warm tea and honey and hot showers with your over the counter cough medicine, please call if you feel you need the nasal inhaler medication we discussed. Have a wonderful day and holiday, see you in about a month!

## 2018-06-03 NOTE — Assessment & Plan Note (Signed)
1/2 ppd smoker, hasn't smoked in the last 3 days due to being busy with packing. Discussed her new year's goal of quitting smoking entirely by Jan 2020. Patient is wanting to try this on her own, aware we have further options to help her quit if needed.

## 2018-06-03 NOTE — Assessment & Plan Note (Addendum)
Well controlled in office today, 125/60, however on home blood pressure log is generally running around SBP 140's. Discussed increasing her physical activity (has treadmill, recommended small bouts of walking to fit into her busy schedule with packing) and continuing to work on smoking cessation.  -Cr 0.79 in 03/2018  -Continue on current regimen, follow up in 1 month with blood pressure log and cuff to ensure appropriate measurements.

## 2018-06-09 ENCOUNTER — Ambulatory Visit: Payer: Medicare Other | Admitting: Family Medicine

## 2018-07-05 ENCOUNTER — Other Ambulatory Visit: Payer: Self-pay | Admitting: Family Medicine

## 2018-07-05 DIAGNOSIS — M069 Rheumatoid arthritis, unspecified: Secondary | ICD-10-CM

## 2018-07-07 ENCOUNTER — Other Ambulatory Visit: Payer: Self-pay | Admitting: Family Medicine

## 2018-07-07 ENCOUNTER — Other Ambulatory Visit: Payer: Self-pay | Admitting: Internal Medicine

## 2018-07-07 DIAGNOSIS — K219 Gastro-esophageal reflux disease without esophagitis: Secondary | ICD-10-CM

## 2018-07-08 ENCOUNTER — Other Ambulatory Visit: Payer: Self-pay | Admitting: *Deleted

## 2018-07-08 DIAGNOSIS — M069 Rheumatoid arthritis, unspecified: Secondary | ICD-10-CM

## 2018-07-09 ENCOUNTER — Other Ambulatory Visit: Payer: Self-pay | Admitting: Family Medicine

## 2018-07-09 ENCOUNTER — Other Ambulatory Visit: Payer: Self-pay | Admitting: *Deleted

## 2018-07-09 DIAGNOSIS — M069 Rheumatoid arthritis, unspecified: Secondary | ICD-10-CM

## 2018-07-09 MED ORDER — CELECOXIB 50 MG PO CAPS: 50.0000 mg | ORAL_CAPSULE | Freq: Two times a day (BID) | ORAL | 0 refills | Status: DC | PRN

## 2018-07-09 NOTE — Telephone Encounter (Signed)
Opened in error. Chaz Mcglasson Dawn, CMA  

## 2018-07-10 ENCOUNTER — Other Ambulatory Visit: Payer: Self-pay

## 2018-07-10 ENCOUNTER — Ambulatory Visit (INDEPENDENT_AMBULATORY_CARE_PROVIDER_SITE_OTHER): Payer: Medicare Other | Admitting: Family Medicine

## 2018-07-10 ENCOUNTER — Encounter: Payer: Self-pay | Admitting: Family Medicine

## 2018-07-10 DIAGNOSIS — I10 Essential (primary) hypertension: Secondary | ICD-10-CM | POA: Diagnosis not present

## 2018-07-10 DIAGNOSIS — F17200 Nicotine dependence, unspecified, uncomplicated: Secondary | ICD-10-CM

## 2018-07-10 NOTE — Patient Instructions (Addendum)
It was a wonderful seeing you again today.  We followed up your blood pressure, which looks perfect!  You can check your blood pressure at home as needed, if you ever experience any dizziness/lightheadedness/tunnel vision, otherwise do not need to check your blood pressure every day now.  I will refill your blood pressure prescriptions for 90-day supply, when you run out here in the next few weeks.  I hope you have a wonderful holiday with your daughter later in December.  I will see you in about 6 months, or sooner if needed.  Please let me know in the meantime if you need any additional assistance in helping cut back on smoking.

## 2018-07-10 NOTE — Assessment & Plan Note (Signed)
Doing well with smoking cessation, down to about 4 cigarettes a day from day half pack previously.  Patient is going to try this on her own, aware we have further options to help her quit if needed.  Let her know to call the clinic at any time if she needs those additional aids.

## 2018-07-10 NOTE — Progress Notes (Signed)
Subjective:    Patient ID: [name] Page, female    DOB: 05/21/1946, 72 y.o.   MRN: [id number]   CC: Blood pressure follow-up  HPI: Ms. back is a 72 year old female who presented to discuss the following:  BP Follow up:  Patient is taking amlodipine 5 mg, HCTZ 25 mg, and losartan 100 mg daily for blood pressure control.  Her blood pressure has been ranging around [id number] systolic at home.  She denies any episodes of lightheadedness, dizziness, tunnel vision, or lower leg swelling.  She is continuing to pack up her house as she is moving in January, has not been able to had much exercise into her routine.  Has been trying to follow a lower-sodium diet. Patient does have a history of CAD, intermittent claudication, hyperlipidemia, and current smoker.  She brought in her home cuff, fits appropriately.   Smoking Cessation: Previously smoked 1/2 PPD, now down to about 4 cigarettes a day.  She has been trying to keep herself busy to avoid smoking, which has been working for her.  She does not want to try any additional smoking cessation aids at this time.  Her goal is to completely quit by the end of January 2020.  Smoking status reviewed  Review of Systems Per HPI, also denies recent illness, fever, headache, changes in vision, chest pain, shortness of breath, abdominal pain, N/V/D, weakness   Patient Active Problem List   Diagnosis Date Noted  . Viral URI with cough 06/03/2018  . Hair loss 09/19/2017  . Osteopenia 11/04/2014  . Polyarthralgia 10/17/2012  . Sinus tachycardia 12/10/2011  . COPD (chronic obstructive pulmonary disease) ([place]) 10/01/2011  . Restrictive lung disease 10/01/2011  . ACNE VULGARIS, CYSTIC, PUSTULAR 03/28/2010  . G E R D 11/20/2006  . HYPERCHOLESTEROLEMIA 09/19/2006  . Smoker 09/19/2006  . HYPERTENSION, BENIGN SYSTEMIC 09/19/2006  . Coronary atherosclerosis 09/19/2006  . CLAUDICATION, INTERMITTENT 09/19/2006     Objective:  BP 122/62   Pulse 86    Temp 99.7 F (37.6 C) (Oral)   Wt 150 lb (68 kg)   SpO2 98%   BMI 27.44 kg/m  Vitals and nursing note reviewed  BP checked with home cuff: 143/78 Manual BP check with our office cuff 2 minutes afterwards: 122/60  General: NAD, pleasant Cardiac: RRR, normal heart sounds Respiratory: Normal effort, no increased work of breathing Abdomen: soft, nontender, nondistended Extremities: no edema or cyanosis. WWP. Skin: warm and dry, no rashes noted Neuro: alert and oriented, no focal deficits Psych: normal affect  Assessment & Plan:    HYPERTENSION, BENIGN SYSTEMIC BP at goal of <130/80 in the office x2.  Although patient is of advanced age, she is at minimal risk for falls and has several risk factors including CAD, intermittent claudication, HLD, and current smoking, therefore we will continue to have her goal of < 130/80.  No current side effects with BP medications. -Continue amlodipine 5, HCTZ 25 mg, losartan 100 mg daily - Monitor BP at home when needed (any symptoms of dizziness, lightheadedness, tunnel vision) -Discontinue checking blood pressure daily, recheck batteries in BP cuff as it is not accurate (cuff appeared to fit correctly)   Smoker Doing well with smoking cessation, down to about 4 cigarettes a day from day half pack previously.  Patient is going to try this on her own, aware we have further options to help her quit if needed.  Let her know to call the clinic at any time if she needs those additional aids.  F/u in 6 months to discuss chronic medical conditions, or sooner as needed   Darrelyn Hillock, DO Family Medicine Resident PGY-1

## 2018-07-10 NOTE — Assessment & Plan Note (Addendum)
BP at goal of <130/80 in the office x2.  Although patient is of advanced age, she is at minimal risk for falls and has several risk factors including CAD, intermittent claudication, HLD, and current smoking, therefore we will continue to have her goal of < 130/80.  No current side effects with BP medications. -Continue amlodipine 5, HCTZ 25 mg, losartan 100 mg daily - Monitor BP at home when needed (any symptoms of dizziness, lightheadedness, tunnel vision) -Discontinue checking blood pressure daily, recheck batteries in BP cuff as it is not accurate (cuff appeared to fit correctly)

## 2018-07-17 ENCOUNTER — Other Ambulatory Visit: Payer: Self-pay

## 2018-07-17 DIAGNOSIS — K219 Gastro-esophageal reflux disease without esophagitis: Secondary | ICD-10-CM

## 2018-07-17 MED ORDER — OMEPRAZOLE 40 MG PO CPDR: 40.0000 mg | DELAYED_RELEASE_CAPSULE | ORAL | 2 refills | Status: DC

## 2018-09-02 ENCOUNTER — Other Ambulatory Visit: Payer: Self-pay

## 2018-09-02 DIAGNOSIS — I10 Essential (primary) hypertension: Secondary | ICD-10-CM

## 2018-09-03 MED ORDER — BISOPROLOL FUMARATE 5 MG PO TABS: 5.0000 mg | ORAL_TABLET | Freq: Every day | ORAL | 11 refills | Status: DC

## 2018-10-06 ENCOUNTER — Other Ambulatory Visit: Payer: Self-pay

## 2018-10-06 DIAGNOSIS — M069 Rheumatoid arthritis, unspecified: Secondary | ICD-10-CM

## 2018-10-07 MED ORDER — CILOSTAZOL 100 MG PO TABS: ORAL_TABLET | ORAL | 0 refills | Status: DC

## 2018-10-07 MED ORDER — CELECOXIB 50 MG PO CAPS: 50.0000 mg | ORAL_CAPSULE | Freq: Two times a day (BID) | ORAL | 0 refills | Status: DC | PRN

## 2018-10-08 ENCOUNTER — Telehealth: Payer: Self-pay

## 2018-10-08 ENCOUNTER — Other Ambulatory Visit: Payer: Self-pay

## 2018-10-08 DIAGNOSIS — I10 Essential (primary) hypertension: Secondary | ICD-10-CM

## 2018-10-08 DIAGNOSIS — M069 Rheumatoid arthritis, unspecified: Secondary | ICD-10-CM

## 2018-10-08 MED ORDER — AMLODIPINE BESYLATE 5 MG PO TABS: 5.0000 mg | ORAL_TABLET | Freq: Every day | ORAL | 3 refills | Status: DC

## 2018-10-11 ENCOUNTER — Other Ambulatory Visit: Payer: Self-pay | Admitting: Family Medicine

## 2018-10-11 DIAGNOSIS — M069 Rheumatoid arthritis, unspecified: Secondary | ICD-10-CM

## 2018-10-13 ENCOUNTER — Other Ambulatory Visit: Payer: Self-pay | Admitting: Family Medicine

## 2018-10-13 DIAGNOSIS — M069 Rheumatoid arthritis, unspecified: Secondary | ICD-10-CM

## 2018-10-13 NOTE — Telephone Encounter (Signed)
Unable to re-prescribe this medication at this time due to epic not recognizing provider. Will troubleshoot this today and try to send in 90 day supply instead.   Patriciaann Clan, DO

## 2018-10-14 NOTE — Telephone Encounter (Signed)
Spoke with pt and she got a 30 day supply last Thursday, but does want to get a 90-day supply on all her meds. I educated her on calling before she runs out. Deseree Kennon Holter, CMA

## 2018-10-27 ENCOUNTER — Other Ambulatory Visit: Payer: Self-pay | Admitting: *Deleted

## 2018-10-27 DIAGNOSIS — I10 Essential (primary) hypertension: Secondary | ICD-10-CM

## 2018-10-27 MED ORDER — AMLODIPINE BESYLATE 5 MG PO TABS: 5.0000 mg | ORAL_TABLET | Freq: Every day | ORAL | 3 refills | Status: DC

## 2018-11-05 ENCOUNTER — Other Ambulatory Visit: Payer: Self-pay | Admitting: *Deleted

## 2018-11-05 DIAGNOSIS — M069 Rheumatoid arthritis, unspecified: Secondary | ICD-10-CM

## 2018-11-07 MED ORDER — CELECOXIB 50 MG PO CAPS: 50.0000 mg | ORAL_CAPSULE | Freq: Two times a day (BID) | ORAL | 0 refills | Status: DC | PRN

## 2018-12-02 ENCOUNTER — Other Ambulatory Visit: Payer: Self-pay | Admitting: Family Medicine

## 2018-12-08 ENCOUNTER — Other Ambulatory Visit: Payer: Self-pay

## 2018-12-09 MED ORDER — PRAVASTATIN SODIUM 40 MG PO TABS: 40.0000 mg | ORAL_TABLET | Freq: Every day | ORAL | 3 refills | Status: DC

## 2019-01-06 ENCOUNTER — Other Ambulatory Visit: Payer: Self-pay | Admitting: *Deleted

## 2019-01-06 MED ORDER — CILOSTAZOL 100 MG PO TABS: ORAL_TABLET | ORAL | 0 refills | Status: DC

## 2019-01-07 ENCOUNTER — Other Ambulatory Visit: Payer: Self-pay | Admitting: *Deleted

## 2019-01-07 DIAGNOSIS — K219 Gastro-esophageal reflux disease without esophagitis: Secondary | ICD-10-CM

## 2019-01-07 MED ORDER — OMEPRAZOLE 40 MG PO CPDR: 40.0000 mg | DELAYED_RELEASE_CAPSULE | ORAL | 2 refills | Status: DC

## 2019-02-02 ENCOUNTER — Other Ambulatory Visit: Payer: Self-pay

## 2019-02-02 ENCOUNTER — Ambulatory Visit (INDEPENDENT_AMBULATORY_CARE_PROVIDER_SITE_OTHER): Payer: Medicare Other

## 2019-02-02 VITALS — Ht 62.0 in | Wt 150.0 lb

## 2019-02-02 DIAGNOSIS — Z0000 Encounter for general adult medical examination without abnormal findings: Secondary | ICD-10-CM

## 2019-02-02 NOTE — Patient Instructions (Addendum)
You spoke to [name] Page, [name] over the phone for your annual wellness visit.  We discussed goals: Goals    . Blood Pressure < 140/90    . Quit smoking / using tobacco     Patient has been smoking more due to the stress of COVID and a recent move.        We also discussed recommended health maintenance. Please call our office and schedule your annual flu vaccine in the fall! You are up to date on everything else. A Tdap prescription can be provided for you at your visit.  Health Maintenance  Topic Date Due  . TETANUS/TDAP  11/21/2015  . INFLUENZA VACCINE  02/21/2019  . MAMMOGRAM  09/28/2019  . COLONOSCOPY  04/17/2023  . DEXA SCAN  Completed  . Hepatitis C Screening  Completed  . PNA vac Low Risk Adult  Completed   Look over the Advanced Directive packet with your daughter. Once completed, bring back to [facility].  We also discussed smoking cessation.   Call 1800-QUIT-NOW for help with stopping smoking.    Steps to Quit Smoking Smoking tobacco is the leading cause of preventable death. It can affect almost every organ in the body. Smoking puts you and people around you at risk for many serious, long-lasting (chronic) diseases. Quitting smoking can be hard, but it is one of the best things that you can do for your health. It is never too late to quit. How do I get ready to quit? When you decide to quit smoking, make a plan to help you succeed. Before you quit:  Pick a date to quit. Set a date within the next 2 weeks to give you time to prepare.  Write down the reasons why you are quitting. Keep this list in places where you will see it often.  Tell your family, friends, and co-workers that you are quitting. Their support is important.  Talk with your doctor about the choices that may help you quit.  Find out if your health insurance will pay for these treatments.  Know the people, places, things, and activities that make you want to smoke (triggers). Avoid them. What first  steps can I take to quit smoking?  Throw away all cigarettes at home, at work, and in your car.  Throw away the things that you use when you smoke, such as ashtrays and lighters.  Clean your car. Make sure to empty the ashtray.  Clean your home, including curtains and carpets. What can I do to help me quit smoking? Talk with your doctor about taking medicines and seeing a counselor at the same time. You are more likely to succeed when you do both.  If you are pregnant or breastfeeding, talk with your doctor about counseling or other ways to quit smoking. Do not take medicine to help you quit smoking unless your doctor tells you to do so. To quit smoking: Quit right away  Quit smoking totally, instead of slowly cutting back on how much you smoke over a period of time.  Go to counseling. You are more likely to quit if you go to counseling sessions regularly. Take medicine You may take medicines to help you quit. Some medicines need a prescription, and some you can buy over-the-counter. Some medicines may contain a drug called nicotine to replace the nicotine in cigarettes. Medicines may:  Help you to stop having the desire to smoke (cravings).  Help to stop the problems that come when you stop smoking (withdrawal  symptoms). Your doctor may ask you to use:  Nicotine patches, gum, or lozenges.  Nicotine inhalers or sprays.  Non-nicotine medicine that is taken by mouth. Find resources Find resources and other ways to help you quit smoking and remain smoke-free after you quit. These resources are most helpful when you use them often. They include:  Online chats with a Social worker.  Phone quitlines.  Printed Furniture conservator/restorer.  Support groups or group counseling.  Text messaging programs.  Mobile phone apps. Use apps on your mobile phone or tablet that can help you stick to your quit plan. There are many free apps for mobile phones and tablets as well as websites. Examples  include Quit Guide from the [employer] and smokefree.gov  What things can I do to make it easier to quit?   Talk to your family and friends. Ask them to support and encourage you.  Call a phone quitline (1-800-QUIT-NOW), reach out to support groups, or work with a Social worker.  Ask people who smoke to not smoke around you.  Avoid places that make you want to smoke, such as: ? Bars. ? Parties. ? Smoke-break areas at work.  Spend time with people who do not smoke.  Lower the stress in your life. Stress can make you want to smoke. Try these things to help your stress: ? Getting regular exercise. ? Doing deep-breathing exercises. ? Doing yoga. ? Meditating. ? Doing a body scan. To do this, close your eyes, focus on one area of your body at a time from head to toe. Notice which parts of your body are tense. Try to relax the muscles in those areas. How will I feel when I quit smoking? Day 1 to 3 weeks Within the first 24 hours, you may start to have some problems that come from quitting tobacco. These problems are very bad 2-3 days after you quit, but they do not often last for more than 2-3 weeks. You may get these symptoms:  Mood swings.  Feeling restless, nervous, angry, or annoyed.  Trouble concentrating.  Dizziness.  Strong desire for high-sugar foods and nicotine.  Weight gain.  Trouble pooping (constipation).  Feeling like you may vomit (nausea).  Coughing or a sore throat.  Changes in how the medicines that you take for other issues work in your body.  Depression.  Trouble sleeping (insomnia). Week 3 and afterward After the first 2-3 weeks of quitting, you may start to notice more positive results, such as:  Better sense of smell and taste.  Less coughing and sore throat.  Slower heart rate.  Lower blood pressure.  Clearer skin.  Better breathing.  Fewer sick days. Quitting smoking can be hard. Do not give up if you fail the first time. Some people need to  try a few times before they succeed. Do your best to stick to your quit plan, and talk with your doctor if you have any questions or concerns. Summary  Smoking tobacco is the leading cause of preventable death. Quitting smoking can be hard, but it is one of the best things that you can do for your health.  When you decide to quit smoking, make a plan to help you succeed.  Quit smoking right away, not slowly over a period of time.  When you start quitting, seek help from your doctor, family, or friends. This information is not intended to replace advice given to you by your health care provider. Make sure you discuss any questions you have with your health  care provider. Document Released: 05/05/2009 Document Revised: 09/26/2018 Document Reviewed: 09/27/2018 Elsevier Patient Education  2020 [place] 65 Years and Older, Female Preventive care refers to lifestyle choices and visits with your health care provider that can promote health and wellness. This includes:  A yearly physical exam. This is also called an annual well check.  Regular dental and eye exams.  Immunizations.  Screening for certain conditions.  Healthy lifestyle choices, such as diet and exercise. What can I expect for my preventive care visit? Physical exam Your health care provider will check:  Height and weight. These may be used to calculate body mass index (BMI), which is a measurement that tells if you are at a healthy weight.  Heart rate and blood pressure.  Your skin for abnormal spots. Counseling Your health care provider may ask you questions about:  Alcohol, tobacco, and drug use.  Emotional well-being.  Home and relationship well-being.  Sexual activity.  Eating habits.  History of falls.  Memory and ability to understand (cognition).  Work and work Statistician.  Pregnancy and menstrual history. What immunizations do I need?  Influenza (flu) vaccine  This is  recommended every year. Tetanus, diphtheria, and pertussis (Tdap) vaccine  You may need a Td booster every 10 years. Varicella (chickenpox) vaccine  You may need this vaccine if you have not already been vaccinated. Zoster (shingles) vaccine  You may need this after age 67. Pneumococcal conjugate (PCV13) vaccine  One dose is recommended after age 63. Pneumococcal polysaccharide (PPSV23) vaccine  One dose is recommended after age 15. Measles, mumps, and rubella (MMR) vaccine  You may need at least one dose of MMR if you were born in 1957 or later. You may also need a second dose. Meningococcal conjugate (MenACWY) vaccine  You may need this if you have certain conditions. Hepatitis A vaccine  You may need this if you have certain conditions or if you travel or work in places where you may be exposed to hepatitis A. Hepatitis B vaccine  You may need this if you have certain conditions or if you travel or work in places where you may be exposed to hepatitis B. Haemophilus influenzae type b (Hib) vaccine  You may need this if you have certain conditions. You may receive vaccines as individual doses or as more than one vaccine together in one shot (combination vaccines). Talk with your health care provider about the risks and benefits of combination vaccines. What tests do I need? Blood tests  Lipid and cholesterol levels. These may be checked every 5 years, or more frequently depending on your overall health.  Hepatitis C test.  Hepatitis B test. Screening  Lung cancer screening. You may have this screening every year starting at age 34 if you have a 30-pack-year history of smoking and currently smoke or have quit within the past 15 years.  Colorectal cancer screening. All adults should have this screening starting at age 93 and continuing until age 45. Your health care provider may recommend screening at age 49 if you are at increased risk. You will have tests every 1-10  years, depending on your results and the type of screening test.  Diabetes screening. This is done by checking your blood sugar (glucose) after you have not eaten for a while (fasting). You may have this done every 1-3 years.  Mammogram. This may be done every 1-2 years. Talk with your health care provider about how often you should have regular mammograms.  BRCA-related cancer screening. This may be done if you have a family history of breast, ovarian, tubal, or peritoneal cancers. Other tests  Sexually transmitted disease (STD) testing.  Bone density scan. This is done to screen for osteoporosis. You may have this done starting at age 31. Follow these instructions at home: Eating and drinking  Eat a diet that includes fresh fruits and vegetables, whole grains, lean protein, and low-fat dairy products. Limit your intake of foods with high amounts of sugar, saturated fats, and salt.  Take vitamin and mineral supplements as recommended by your health care provider.  Do not drink alcohol if your health care provider tells you not to drink.  If you drink alcohol: ? Limit how much you have to 0-1 drink a day. ? Be aware of how much alcohol is in your drink. In the [place] one drink equals one 12 oz bottle of beer (355 mL), one 5 oz glass of wine (148 mL), or one 1 oz glass of hard liquor (44 mL). Lifestyle  Take daily care of your teeth and gums.  Stay active. Exercise for at least 30 minutes on 5 or more days each week.  Do not use any products that contain nicotine or tobacco, such as cigarettes, e-cigarettes, and chewing tobacco. If you need help quitting, ask your health care provider.  If you are sexually active, practice safe sex. Use a condom or other form of protection in order to prevent STIs (sexually transmitted infections).  Talk with your health care provider about taking a low-dose aspirin or statin. What's next?  Go to your health care provider once a year for a well  check visit.  Ask your health care provider how often you should have your eyes and teeth checked.  Stay up to date on all vaccines. This information is not intended to replace advice given to you by your health care provider. Make sure you discuss any questions you have with your health care provider. Document Released: 08/05/2015 Document Revised: 07/03/2018 Document Reviewed: 07/03/2018 Elsevier Patient Education  2020 [place] clinic's number is [phone]. Please call with questions or concerns about what we discussed today.

## 2019-02-02 NOTE — Progress Notes (Addendum)
Subjective:   [name] Page is a 73 y.o. female who presents for Medicare Annual (Subsequent) preventive examination.  The patient consented to a virtual visit.  Review of Systems: Defer to PCP  Cardiac Risk Factors include: advanced age (>8men, >56 women);hypertension     Objective:     Vitals: Ht 5\' 2"  (1.575 m)   Wt 150 lb (68 kg)   BMI 27.44 kg/m   Body mass index is 27.44 kg/m.  Advanced Directives 02/02/2019 07/10/2018 06/03/2018 05/06/2018 04/02/2018 05/16/2017 04/17/2017  Does Patient Have a Medical Advance Directive? No No No No No No No  Type of Advance Directive - - - - - - -  Does patient want to make changes to medical advance directive? - - - - - - -  Copy of [place] in Chart? - - - - - - -  Would patient like information on creating a medical advance directive? Yes (MAU/Ambulatory/Procedural Areas - Information given) No - Patient declined No - Patient declined No - Patient declined No - Patient declined No - Patient declined No - Patient declined    Tobacco Social History   Tobacco Use  Smoking Status Current Every Day Smoker  . Packs/day: 0.25  . Years: 40.00  . Pack years: 10.00  . Types: Cigarettes  Smokeless Tobacco Never Used  Tobacco Comment   working on quitting     Patient is still working on quitting smoking. Counseling and handouts provided to patient via mail.  Clinical Intake:  Pre-visit preparation completed: Yes  Pain Score: 0-No pain  How often do you need to have someone help you when you read instructions, pamphlets, or other written materials from your doctor or pharmacy?: 2 - Rarely What is the last grade level you completed in school?: high school  Interpreter Needed?: No  Past Medical History:  Diagnosis Date  . Allergic rhinitis 09/19/2006   Qualifier: Diagnosis of  By: Henderson Baltimore MD, Janett Billow    . Allergy   . Arthritis   . [place]oz tarry stools    from iron pills  . CHF (congestive heart failure)  ([place]) 1997  . Colon polyps 2005  . COPD (chronic obstructive pulmonary disease) ([place])   . GERD (gastroesophageal reflux disease)   . Heart attack ([place]) 1997 and 2001  . Hyperlipidemia   . Hypertension   . Pneumonia 2012  . Status post dilation of esophageal narrowing    Past Surgical History:  Procedure Laterality Date  . CESAREAN SECTION  01/1976  . COLONOSCOPY    . CORONARY ANGIOPLASTY WITH STENT PLACEMENT  2002  . POLYPECTOMY     Family History  Problem Relation Age of Onset  . Heart disease Father   . Colon cancer Mother 56       EARLY 70'S  . Cancer Mother        colon  . Clotting disorder Brother   . Diabetes Sister   . Esophageal cancer Sister   . Rectal cancer Neg Hx   . Stomach cancer Neg Hx    Social History   Socioeconomic History  . Marital status: Legally Separated    Spouse name: Percell Miller  . Number of children: 2  . Years of education: 51  . Highest education level: Not on file  Occupational History  . 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 Financial resource strain: Not very hard  . Food insecurity    Worry: Never true  Inability: Never true  . Transportation needs    Medical: No    Non-medical: No  Tobacco Use  . Smoking status: Current Every Day Smoker    Packs/day: 0.25    Years: 40.00    Pack years: 10.00    Types: Cigarettes  . Smokeless tobacco: Never Used  . Tobacco comment: working on quitting  Substance and Sexual Activity  . Alcohol use: No  . Drug use: No  . Sexual activity: Not Currently  Lifestyle  . Physical activity    Days per week: 5 days    Minutes per session: 10 min  . Stress: Not at all  Relationships  . Social connections    Talks on phone: More than three times a week    Gets together: Three times a week    Attends religious service: 1 to 4 times per year    Active member of club or organization: No    Attends meetings of clubs or organizations: Never    Relationship status:  Separated  Other Topics Concern  . Not on file  Social History Narrative   Patient lives alone here in [place].    Patient has close relationships with her daughter, Sharrie Rothman, and surrounding family in the area.    Patient enjoys cooking and writing poetry.    Patient is separated from her husband, however they maintain a close friendship.     Outpatient Encounter Medications as of 02/02/2019  Medication Sig  . amLODipine (NORVASC) 5 MG tablet Take 1 tablet (5 mg total) by mouth daily.  Marland Kitchen aspirin EC 81 MG tablet Take 81 mg by mouth daily.  . bisoprolol (ZEBETA) 5 MG tablet Take 1 tablet (5 mg total) by mouth daily.  . celecoxib (CELEBREX) 50 MG capsule Take 1 capsule (50 mg total) by mouth 2 (two) times daily as needed.  . cetirizine (ZYRTEC) 10 MG tablet Take 1 tablet (10 mg total) by mouth daily.  . cilostazol (PLETAL) 100 MG tablet TAKE 1 TABLET(100 MG) BY MOUTH TWICE DAILY  . hydrochlorothiazide (HYDRODIURIL) 25 MG tablet TAKE 1 TABLET(25 MG) BY MOUTH DAILY  . losartan (COZAAR) 100 MG tablet Take 1 tablet (100 mg total) by mouth daily.  Marland Kitchen omeprazole (PRILOSEC) 40 MG capsule Take 1 capsule (40 mg total) by mouth every other day.  . pravastatin (PRAVACHOL) 40 MG tablet Take 1 tablet (40 mg total) by mouth daily.   No facility-administered encounter medications on file as of 02/02/2019.     Activities of Daily Living In your present state of health, do you have any difficulty performing the following activities: 02/02/2019  Hearing? N  Vision? Y  Comment reading glasses  Difficulty concentrating or making decisions? N  Walking or climbing stairs? N  Dressing or bathing? N  Doing errands, shopping? N  Preparing Food and eating ? N  Using the Toilet? N  In the past six months, have you accidently leaked urine? N  Do you have problems with loss of bowel control? N  Managing your Medications? N  Managing your Finances? N  Housekeeping or managing your Housekeeping? N  Some recent  data might be hidden    Patient Care Team: Patriciaann Clan, DO as PCP - General Rex Kras Jeanella Craze, MD as Attending Physician (Cardiology)    Assessment:   This is a routine wellness examination for [place].  Exercise Activities and Dietary recommendations Current Exercise Habits: Home exercise routine, Type of exercise: walking, Time (Minutes): 10, Frequency (Times/Week): 5, Weekly  Exercise (Minutes/Week): 50, Intensity: Mild  Patient walks on her treadmill 5x a week for ~68minutes each time. Patient used to walk around her neighborhood, but felt unsafe due to multiple dogs.  Goals    . Blood Pressure < 140/90    . Quit smoking / using tobacco     Patient has been smoking more due to the stress of COVID and a recent move.        Fall Risk Fall Risk  02/02/2019 07/10/2018 05/06/2018 04/02/2018 05/16/2017  Falls in the past year? 0 0 No No No   Is the patient's home free of loose throw rugs in walkways, pet beds, electrical cords, etc?   yes      Grab bars in the bathroom? no      Handrails on the stairs?   no      Adequate lighting?   yes  Patient rating of health (0-10) scale: 9   Depression Screen PHQ 2/9 Scores 02/02/2019 07/10/2018 06/03/2018 05/06/2018  PHQ - 2 Score 0 0 0 0  PHQ- 9 Score - - - -     Cognitive Function 6 CIT Score- 0  Immunization History  Administered Date(s) Administered  . Influenza Split 05/23/2011  . Influenza,inj,Quad PF,6+ Mos 05/18/2013, 05/17/2014, 05/12/2015, 07/26/2016, 04/17/2017, 05/06/2018  . Pneumococcal Conjugate-13 12/08/2014  . Pneumococcal Polysaccharide-23 05/23/2011  . Td 11/20/2005    Screening Tests Health Maintenance  Topic Date Due  . TETANUS/TDAP  11/21/2015  . INFLUENZA VACCINE  02/21/2019  . MAMMOGRAM  09/28/2019  . COLONOSCOPY  04/17/2023  . DEXA SCAN  Completed  . Hepatitis C Screening  Completed  . PNA vac Low Risk Adult  Completed    Cancer Screenings: Lung: Low Dose CT Chest recommended if Age  39-80 years, 30 pack-year currently smoking OR have quit w/in 15years. Patient does qualify. Breast:  Up to date on Mammogram? Yes   Up to date of Bone Density/Dexa? Yes Colorectal: 2019 WNL, repeat 5 years for surveillance   Additional Screenings: Hepatitis C Screening: 2018     Plan:   Look over the Advanced Directive packet with your daughter. Once completed, [facility] back [facility].  Schedule an apt with PCP for fall for annual flu vaccine and blood pressure follow-up.  Continue to walk on your treadmill.  Continue to work on smoking cessation.   I have personally reviewed and noted the following in the patient's chart:   . Medical and social history . Use of alcohol, tobacco or illicit drugs  . Current medications and supplements . Functional ability and status . Nutritional status . Physical activity . Advanced directives . List of other physicians . Hospitalizations, surgeries, and ER visits in previous 12 months . Vitals . Screenings to include cognitive, depression, and falls . Referrals and appointments  In addition, I have reviewed and discussed with patient certain preventive protocols, quality metrics, and best practice recommendations. A written personalized care plan for preventive services as well as general preventive health recommendations were provided to patient.  This visit was conducted virtually in the setting of the [place] pandemic.    Dorna Bloom, [place]  02/02/2019    I have reviewed this visit and agree with the documentation.   Patriciaann Clan, DO

## 2019-02-04 ENCOUNTER — Other Ambulatory Visit: Payer: Self-pay

## 2019-02-04 DIAGNOSIS — M069 Rheumatoid arthritis, unspecified: Secondary | ICD-10-CM

## 2019-02-06 MED ORDER — CELECOXIB 50 MG PO CAPS: 50.0000 mg | ORAL_CAPSULE | Freq: Two times a day (BID) | ORAL | 0 refills | Status: DC | PRN

## 2019-03-21 ENCOUNTER — Other Ambulatory Visit: Payer: Self-pay | Admitting: Family Medicine

## 2019-03-27 ENCOUNTER — Ambulatory Visit (INDEPENDENT_AMBULATORY_CARE_PROVIDER_SITE_OTHER): Payer: Medicare Other | Admitting: Family Medicine

## 2019-03-27 ENCOUNTER — Encounter: Payer: Self-pay | Admitting: Family Medicine

## 2019-03-27 ENCOUNTER — Other Ambulatory Visit: Payer: Self-pay

## 2019-03-27 VITALS — BP 130/64 | HR 71 | Wt 150.2 lb

## 2019-03-27 DIAGNOSIS — F17200 Nicotine dependence, unspecified, uncomplicated: Secondary | ICD-10-CM

## 2019-03-27 DIAGNOSIS — M8589 Other specified disorders of bone density and structure, multiple sites: Secondary | ICD-10-CM | POA: Diagnosis not present

## 2019-03-27 DIAGNOSIS — M26609 Unspecified temporomandibular joint disorder, unspecified side: Secondary | ICD-10-CM | POA: Insufficient documentation

## 2019-03-27 DIAGNOSIS — K219 Gastro-esophageal reflux disease without esophagitis: Secondary | ICD-10-CM | POA: Diagnosis not present

## 2019-03-27 DIAGNOSIS — I10 Essential (primary) hypertension: Secondary | ICD-10-CM | POA: Diagnosis not present

## 2019-03-27 DIAGNOSIS — E7800 Pure hypercholesterolemia, unspecified: Secondary | ICD-10-CM | POA: Diagnosis not present

## 2019-03-27 DIAGNOSIS — Z23 Encounter for immunization: Secondary | ICD-10-CM

## 2019-03-27 DIAGNOSIS — M2550 Pain in unspecified joint: Secondary | ICD-10-CM

## 2019-03-27 HISTORY — DX: Unspecified temporomandibular joint disorder, unspecified side: M26.609

## 2019-03-27 MED ORDER — DICLOFENAC SODIUM 1 % TD GEL: 4.0000 g | Freq: Four times a day (QID) | TRANSDERMAL | 1 refills | Status: DC | PRN

## 2019-03-27 MED ORDER — FAMOTIDINE 20 MG PO TABS: 20.0000 mg | ORAL_TABLET | Freq: Two times a day (BID) | ORAL | 3 refills | Status: DC

## 2019-03-27 NOTE — Assessment & Plan Note (Signed)
LDL 69 in 2016, longstanding control on pravastatin 40 mg.  Primary prevention. - Repeat lipid panel -Continue pravastatin 40 mg

## 2019-03-27 NOTE — Assessment & Plan Note (Signed)
Take Celebrex daily, and has had several conversation with previous providers on discontinuing given history of PAD/CAD however has declined.  Re-discussed with patient today, she is willing to think this over and trial Voltaren gel/Tylenol as needed for relief. - Recommend taper off Celebrex -Start Voltaren gel 4 times daily as needed -May take up to 4 g daily of Tylenol as needed for pain relief - Recommend follow-up with rheumatology

## 2019-03-27 NOTE — Assessment & Plan Note (Signed)
Continued tobacco use, 1 pack lasts approximately 3 days.  Desires cessation, given 1 800 quit now number to call for additional support.  Additionally discussed other stress relieving activities to try rather than smoking.

## 2019-03-27 NOTE — Patient Instructions (Signed)
It was wonderful to see you as always.  1.  For your ear: Please restart doing your TMJ jaw exercises twice daily for the next several weeks, if it is not getting better or worsening please follow-up and we can reassess.  2.  For your GERD: We are to try to wean off of your omeprazole, go down to taking 1/2 tablets of 20 mg every other day for about 2 weeks and then stop this.  During this time we are going to start famotidine 20 mg twice daily to help with reflux.  If you have breakthrough symptoms, you can always use a Tums to help.  Be mindful that Tums sometimes can turn your stool darker.  3.  We do need to get a DEXA scan sometime soon, we will talk about this on your follow-up visit.  4.  We are going to get labs today just to check your electrolytes and kidney function while on your blood pressure medications, I will let you know these results within the next week.  5.  Please call 1 800 quit now for additional support to help you quit smoking.  Try to find things that you enjoy and help provide reducing stress for you to be able to quit.  It is so important and I know that your family stands behind you to help you completely stop.  6.  Lastly, to help with your joint pains instead of the Celebrex--we can try Voltaren gel and also Tylenol up to 4 g total daily.

## 2019-03-27 NOTE — Assessment & Plan Note (Signed)
Well-controlled.  - Continue amlodipine 5 mg, HCTZ 25 mg, and losartan 100 mg daily - Will check BMP today to evaluate electrolytes and renal function, wnl in 03/2018

## 2019-03-27 NOTE — Progress Notes (Signed)
Subjective:    Patient ID: [name] Page, female    DOB: Apr 13, 1946, 73 y.o.   MRN: [id number]   CC: Blood pressure follow-up  HPI: [name] Page is a 73 year old female presenting discuss the following:  Hypertension  Patient here for follow-up of controlled arterial hypertension. Blood pressure is well controlled at home. He/She is exercising and is adherent to a low-salt diet. She currently takes HCTZ 25 mg, losartan 100 mg, and amlodipine 5 mg and is adherent to regimen.  Cardiac symptoms: none. Patient denies: chest pain, dyspnea, exertional chest pressure/discomfort and lower extremity edema. Cardiovascular risk factors: advanced age (older than 72 for men, 65 for women), dyslipidemia, hypertension and smoking/ tobacco exposure. Use of agents associated with hypertension: NSAIDS. History of target organ damage: none.  Ear pain: Sharp pain feels in the "ear drum" that will last 5-10 seconds. Present for about 2 months. Mainly in the left ear, but sometimes in the right. Denies any change in hearing, tinnitus, fever, ear drainage, pain with ear movements. Had similar experience before, however felt more like a pressure sensation rather than sharp.  At that time she was treated for TMJ and resolved.  Smoking status reviewed--1 pack lasts her 3 days.  She had tried to quit smoking last year, goal to stop on 07/2018, however just feels with the stress of the pandemic and still unpacking boxes at her house has continued.  She is interested in quitting and knows that she needs to.  Does have family support.  For her GERD, still takes omeprazole, however takes every other day.  No current symptomatology.  Review of Systems Per HPI, also denies recent illness, fever, headache, changes in vision, chest pain, shortness of breath, abdominal pain, N/V/D, weakness   Patient Active Problem List   Diagnosis Date Noted  . TMJ dysfunction 03/27/2019  . Hair loss 09/19/2017  . Osteopenia 11/04/2014   . Polyarthralgia 10/17/2012  . Sinus tachycardia 12/10/2011  . COPD (chronic obstructive pulmonary disease) ([place]) 10/01/2011  . Restrictive lung disease 10/01/2011  . ACNE VULGARIS, CYSTIC, PUSTULAR 03/28/2010  . G E R D 11/20/2006  . HYPERCHOLESTEROLEMIA 09/19/2006  . Smoker 09/19/2006  . HYPERTENSION, BENIGN SYSTEMIC 09/19/2006  . Coronary atherosclerosis 09/19/2006  . CLAUDICATION, INTERMITTENT 09/19/2006     Objective:  BP 130/64   Pulse 71   Wt 150 lb 3.2 oz (68.1 kg)   SpO2 98%   BMI 27.47 kg/m  Vitals and nursing note reviewed  General: NAD, pleasant HEENT: MMM, EOMI, PERRLA, bilateral tympanic membranes clear with appropriate light reflex and external canals normal bilaterally.  No anterior/posterior cervical lymphadenopathy palpated.  Note crepitus within TMJ joints bilaterally with jaw movement, jaw favors opening to the left side. Cardiac: RRR, normal heart sounds Respiratory: CTAB, normal effort Abdomen: soft, nontender, nondistended Extremities: no edema or cyanosis. WWP. Skin: warm and dry, no rashes noted Neuro: alert and oriented, no focal deficits Psych: normal affect  Assessment & Plan:   HYPERTENSION, BENIGN SYSTEMIC Well-controlled.  - Continue amlodipine 5 mg, HCTZ 25 mg, and losartan 100 mg daily - Will check BMP today to evaluate electrolytes and renal function, wnl in 03/2018   HYPERCHOLESTEROLEMIA LDL 69 in 2016, longstanding control on pravastatin 40 mg.  Primary prevention. - Repeat lipid panel -Continue pravastatin 40 mg  G E R D No current symptomatology.  Continues to take omeprazole 40 mg every other day.  Given associated side effects, including interactions with concurrent medications and osteoporosis (with known osteopenia), will  have patient wean off of this.  Patient is agreeable. - Decrease down to omeprazole 20 mg for the next 2-3 weeks, then stop - Start famotidine 20 mg twice daily - May use Tums PRN breakthrough symptoms,  counseled on darkening stools  Osteopenia DEXA scan in 09/2014 with a T score of -1.9. Given moderate osteopenia, suspected progression for developing osteoporosis around 5 years. - Recommend repeat DEXA scan to evaluate for osteoporosis, patient would like to wait until follow-up - Encourage continued weightbearing exercises, calcium/vitamin D  TMJ dysfunction Suspect ear pain is secondary to TMJ dysfunction with crepitus and jaw malalignment noted on exam, especially given previous ear pain resolved following TMJ exercises.  TMs and external canals unremarkable bilaterally with no concurrent infectious s/sx, making otitis media/externa less likely. - Restart TMJ exercises twice daily for the next several weeks - Follow-up if not improving or sooner if worsening  Polyarthralgia Take Celebrex daily, and has had several conversation with previous providers on discontinuing given history of PAD/CAD however has declined.  Re-discussed with patient today, she is willing to think this over and trial Voltaren gel/Tylenol as needed for relief. - Recommend taper off Celebrex -Start Voltaren gel 4 times daily as needed -May take up to 4 g daily of Tylenol as needed for pain relief - Recommend follow-up with rheumatology  Smoker Continued tobacco use, 1 pack lasts approximately 3 days.  Desires cessation, given 1 800 quit now number to call for additional support.  Additionally discussed other stress relieving activities to try rather than smoking.   Received annual flu shot today, will discuss Tdap on follow-up.  Follow-up in approximately 3 months for chronic conditions or sooner if needed.  [facility] Resident PGY-2

## 2019-03-27 NOTE — Assessment & Plan Note (Addendum)
DEXA scan in 09/2014 with a T score of -1.9. Given moderate osteopenia, suspected progression for developing osteoporosis around 5 years. - Recommend repeat DEXA scan to evaluate for osteoporosis, patient would like to wait until follow-up - Encourage continued weightbearing exercises, calcium/vitamin D

## 2019-03-27 NOTE — Assessment & Plan Note (Signed)
No current symptomatology.  Continues to take omeprazole 40 mg every other day.  Given associated side effects, including interactions with concurrent medications and osteoporosis (with known osteopenia), will have patient wean off of this.  Patient is agreeable. - Decrease down to omeprazole 20 mg for the next 2-3 weeks, then stop - Start famotidine 20 mg twice daily - May use Tums PRN breakthrough symptoms, counseled on darkening stools

## 2019-03-27 NOTE — Assessment & Plan Note (Signed)
Suspect ear pain is secondary to TMJ dysfunction with crepitus and jaw malalignment noted on exam, especially given previous ear pain resolved following TMJ exercises.  TMs and external canals unremarkable bilaterally with no concurrent infectious s/sx, making otitis media/externa less likely. - Restart TMJ exercises twice daily for the next several weeks - Follow-up if not improving or sooner if worsening

## 2019-03-28 LAB — LIPID PANEL
Chol/HDL Ratio: 2.3 ratio (ref 0.0–4.4)
Cholesterol, Total: 130 mg/dL (ref 100–199)
HDL: 56 mg/dL (ref >39)
LDL Chol Calc (NIH): 58 mg/dL (ref 0–99)
Triglycerides: 80 mg/dL (ref 0–149)
VLDL Cholesterol Cal: 16 mg/dL (ref 5–40)

## 2019-03-28 LAB — BASIC METABOLIC PANEL
BUN/Creatinine Ratio: 11 — ABNORMAL LOW (ref 12–28)
BUN: 8 mg/dL (ref 8–27)
CO2: 27 mmol/L (ref 20–29)
Calcium: 9.9 mg/dL (ref 8.7–10.3)
Chloride: 94 mmol/L — ABNORMAL LOW (ref 96–106)
Creatinine, Ser: 0.7 mg/dL (ref 0.57–1.00)
GFR calc Af Amer: 99 mL/min/{1.73_m2} (ref >59)
GFR calc non Af Amer: 86 mL/min/{1.73_m2} (ref >59)
Glucose: 93 mg/dL (ref 65–99)
Potassium: 4 mmol/L (ref 3.5–5.2)
Sodium: 134 mmol/L (ref 134–144)

## 2019-03-31 ENCOUNTER — Encounter: Payer: Self-pay | Admitting: Family Medicine

## 2019-04-14 ENCOUNTER — Other Ambulatory Visit: Payer: Self-pay

## 2019-04-14 MED ORDER — CILOSTAZOL 100 MG PO TABS: ORAL_TABLET | ORAL | 0 refills | Status: DC

## 2019-04-20 ENCOUNTER — Other Ambulatory Visit: Payer: Self-pay | Admitting: Family Medicine

## 2019-05-05 ENCOUNTER — Other Ambulatory Visit: Payer: Self-pay

## 2019-05-05 DIAGNOSIS — M069 Rheumatoid arthritis, unspecified: Secondary | ICD-10-CM

## 2019-05-05 MED ORDER — CELECOXIB 50 MG PO CAPS: 50.0000 mg | ORAL_CAPSULE | Freq: Two times a day (BID) | ORAL | 0 refills | Status: AC | PRN

## 2019-05-06 ENCOUNTER — Other Ambulatory Visit: Payer: Self-pay

## 2019-05-06 DIAGNOSIS — I10 Essential (primary) hypertension: Secondary | ICD-10-CM

## 2019-05-06 MED ORDER — AMLODIPINE BESYLATE 5 MG PO TABS: 5.0000 mg | ORAL_TABLET | Freq: Every day | ORAL | 1 refills | Status: DC

## 2019-06-22 ENCOUNTER — Other Ambulatory Visit: Payer: Self-pay | Admitting: *Deleted

## 2019-06-22 DIAGNOSIS — K219 Gastro-esophageal reflux disease without esophagitis: Secondary | ICD-10-CM

## 2019-06-22 MED ORDER — OMEPRAZOLE 40 MG PO CPDR: 40.0000 mg | DELAYED_RELEASE_CAPSULE | ORAL | 2 refills | Status: DC

## 2019-07-10 ENCOUNTER — Encounter: Payer: Self-pay | Admitting: Family Medicine

## 2019-07-10 ENCOUNTER — Other Ambulatory Visit: Payer: Self-pay

## 2019-07-10 ENCOUNTER — Ambulatory Visit (INDEPENDENT_AMBULATORY_CARE_PROVIDER_SITE_OTHER): Payer: Medicare Other | Admitting: Family Medicine

## 2019-07-10 VITALS — BP 158/72 | HR 69 | Wt 148.8 lb

## 2019-07-10 DIAGNOSIS — I10 Essential (primary) hypertension: Secondary | ICD-10-CM

## 2019-07-10 DIAGNOSIS — L659 Nonscarring hair loss, unspecified: Secondary | ICD-10-CM

## 2019-07-10 DIAGNOSIS — K219 Gastro-esophageal reflux disease without esophagitis: Secondary | ICD-10-CM | POA: Diagnosis not present

## 2019-07-10 DIAGNOSIS — F17200 Nicotine dependence, unspecified, uncomplicated: Secondary | ICD-10-CM

## 2019-07-10 DIAGNOSIS — M8580 Other specified disorders of bone density and structure, unspecified site: Secondary | ICD-10-CM | POA: Diagnosis not present

## 2019-07-10 MED ORDER — CLOBETASOL PROPIONATE 0.05 % EX SHAM: 1.0000 "application " | MEDICATED_SHAMPOO | Freq: Every day | CUTANEOUS | 0 refills | Status: DC

## 2019-07-10 MED ORDER — TERBINAFINE HCL 250 MG PO TABS: 250.0000 mg | ORAL_TABLET | Freq: Every day | ORAL | 1 refills | Status: DC

## 2019-07-10 NOTE — Assessment & Plan Note (Signed)
Longstanding, last EGD in 2014.  Has some breakthrough symptoms with famotidine BID and Tums PRN, recently transition to this off of PPI for which she was on numerous years.  Will have her use omeprazole 20 mg intermittently for symptoms not resolved by the regimen above, however with her current diet this is fortunately infrequent.

## 2019-07-10 NOTE — Patient Instructions (Signed)
It was wonderful to see you today.  I know that you can cut back on your smoking, make sure you stick to your guns and always give [place] a call if you would like any additional nicotine therapy.  For your scalp, I have sent in a steroid shampoo for you to use for about the next 2-3 weeks.  Apply a small amount to your dry scalp and leave it in for 15 minutes and then can rinse.  You can use this daily.  I would like to see you in the new year to follow-up on your scalp, discontinue if you notice a worsening.  For your GERD, you can add back the omeprazole 20 mg just as needed when your symptoms are not resolved with the twice daily famotidine and Tums.

## 2019-07-10 NOTE — Assessment & Plan Note (Signed)
Borderline control today, however previously has been managed quite well through previous readings.  Encouraged her to work towards complete smoking cessation and continue physical activity.  Discussed taking her blood pressure every few days at home and keeping a journal to bring on her follow-up visit, may need to increase amlodipine up to 10 mg.

## 2019-07-10 NOTE — Assessment & Plan Note (Signed)
Chronic, ongoing for the past 2 years.  Appears to be 2 different areas, with several patches of hypopigmented regions with complete hair loss more concerning for alopecia accreta/vitiligo, autoimmune disorder. She also has a few cm scaly region with Stoltzfus dots more consistent with tinea capitis, however that is not as common in older population.  Will go ahead and treat for possible fungal etiology with terbinafine for the next 4 weeks and have her follow-up in 07/2019.  In addition to this, will go ahead and refer her to dermatology for further work-up.

## 2019-07-10 NOTE — Progress Notes (Signed)
Subjective:    Patient ID: [name] Page, female    DOB: Jun 11, 1946, 73 y.o.   MRN: [id number]   CC: Blood pressure  HPI: Ms. Kaszynski is a 73 year old female with hypertension, COPD/restrictive lung disease, intermittent claudication presenting discuss the following:  Hypertension: She is exercising (walking around the neighborhood) and is adherent to a low-salt diet, enjoys a lot of salads and fruits. She currently takes HCTZ 25 mg, losartan 100 mg, and amlodipine 5 mg and is adherent to regimen.  Cardiac symptoms: none. Patient denies: chest pain, dyspnea, exertional chest pressure/discomfort and fatigue. Cardiovascular risk factors: advanced age (older than 56 for men, 69 for women), dyslipidemia, hypertension and smoking/ tobacco exposure. Use of agents associated with hypertension: NSAIDS. History of target organ damage: none.  Smoker: 1/2 ppd now.  Had wanted to quit last winter, however with the stress of moving her house and then stressed with her daughter she has not gone to that point.  She is now adamant that she is chosen the day after Christmas to be her stopping day and would like to go cold [place].  She is quit numerous times in the past with going cold [place] would like to do this again.  Not interested in meeting with Dr. Valentina Lucks for tobacco cessation or discussing NRT at this time.  GERD: Using famotidine 20 mg twice daily and Tums as needed for breakthrough symptoms.  She is not using the PPI any further.  She still has some breakthrough symptoms despite that therapy with certain foods.  However on a average basis she is doing quite well as she mainly eats salads and soups.  At the end of our visit, she mentions her concern with her scalp.  States she has noticed problems with her scalp for the past 2 years and it is very itchy/bothersome to her.  Notes some areas of hair loss.  Previously had evaluated back in 08/2017 and felt it appeared consistent with vitiligo and referred to  dermatology, however did not see that referral in history.  She has tried [employer] shampoo and psoriasis shampoo with little benefit.   Smoking status reviewed  Review of Systems Per HPI    Objective:  BP (!) 158/72   Pulse 69   Wt 148 lb 12.8 oz (67.5 kg)   SpO2 100%   BMI 27.22 kg/m   Recheck 15 minutes later with BP of 140/67. Vitals and nursing note reviewed  General: NAD, pleasant HEENT: Scattered regions of hypopigmentation with few erythematous macules and associated hair loss with no further hair formation in these patches.  Additionally noted a few centimeter circular scale with multiple Bowns dots Cardiac: RRR, normal heart sounds, 2/6 systolic murmur best heard in the right second intercostal space Respiratory: CTAB, normal effort Extremities: no edema or cyanosis. WWP. Skin: warm and dry, no rashes noted Neuro: alert and oriented, no focal deficits Psych: normal affect        Assessment & Plan:    HYPERTENSION, BENIGN SYSTEMIC Borderline control today, however previously has been managed quite well through previous readings.  Encouraged her to work towards complete smoking cessation and continue physical activity.  Discussed taking her blood pressure every few days at home and keeping a journal to bring on her follow-up visit, may need to increase amlodipine up to 10 mg.  G E R D Longstanding, last EGD in 2014.  Has some breakthrough symptoms with famotidine BID and Tums PRN, recently transition to this off of PPI  for which she was on numerous years.  Will have her use omeprazole 20 mg intermittently for symptoms not resolved by the regimen above, however with her current diet this is fortunately infrequent.   Smoker Desires to quit and accomplish this on her own.  Let her know that we are here if she needs any additional medication assistance/encouragement.   Osteopenia DEXA scan in 09/2014 with a T score of -1.9.  Given moderate osteopenia, suspected  progression for developing osteoporosis is around 5 years.  Placed order for repeat DEXA scan to occur in the new year.  Encouraged continued weightbearing exercises and appropriate nutrition/vitamin supplementation.  Hair loss Chronic, ongoing for the past 2 years.  Appears to be 2 different areas, with several patches of hypopigmented regions with complete hair loss more concerning for alopecia accreta/vitiligo, autoimmune disorder. She also has a few cm scaly region with Neira dots more consistent with tinea capitis, however that is not as common in older population.  Will go ahead and treat for possible fungal etiology with terbinafine for the next 4 weeks and have her follow-up in 07/2019.  In addition to this, will go ahead and refer her to dermatology for further work-up.   Follow-up in January 2021 or sooner if needed.  [facility] Resident PGY-2

## 2019-07-10 NOTE — Assessment & Plan Note (Signed)
DEXA scan in 09/2014 with a T score of -1.9.  Given moderate osteopenia, suspected progression for developing osteoporosis is around 5 years.  Placed order for repeat DEXA scan to occur in the new year.  Encouraged continued weightbearing exercises and appropriate nutrition/vitamin supplementation.

## 2019-07-10 NOTE — Assessment & Plan Note (Signed)
Desires to quit and accomplish this on her own.  Let her know that we are here if she needs any additional medication assistance/encouragement.

## 2019-07-14 ENCOUNTER — Other Ambulatory Visit: Payer: Self-pay | Admitting: *Deleted

## 2019-07-15 MED ORDER — CILOSTAZOL 100 MG PO TABS: ORAL_TABLET | ORAL | 0 refills | Status: DC

## 2019-08-10 ENCOUNTER — Ambulatory Visit (INDEPENDENT_AMBULATORY_CARE_PROVIDER_SITE_OTHER): Payer: Medicare Other | Admitting: Family Medicine

## 2019-08-10 ENCOUNTER — Other Ambulatory Visit: Payer: Self-pay

## 2019-08-10 ENCOUNTER — Encounter: Payer: Self-pay | Admitting: Family Medicine

## 2019-08-10 DIAGNOSIS — I10 Essential (primary) hypertension: Secondary | ICD-10-CM | POA: Diagnosis not present

## 2019-08-10 DIAGNOSIS — F17200 Nicotine dependence, unspecified, uncomplicated: Secondary | ICD-10-CM

## 2019-08-10 DIAGNOSIS — L659 Nonscarring hair loss, unspecified: Secondary | ICD-10-CM

## 2019-08-10 NOTE — Progress Notes (Signed)
Subjective:    Patient ID: [name] Page, female    DOB: [birth year], 74 y.o.   MRN: [id number]   CC: F/u skin   HPI: Ms. Maddock is a 74 year old female with hypertension, tobacco use, CAD/intermittent claudication, and hyperlipidemia presenting discuss the following:  Hair loss: Discussed on 12/18, reports these patchy areas have been intermittently present for the past 2 years.  Brought up to previous PCP however dermatology referral never went through.  She has a dermatology appointment scheduled for 1/25, however states she is wanting to cancel this as she cannot afford going to a specialist right now and does not want to go out to [place].  Given some areas that were concerning for tinea capitis, decided to treat with terbinafine for 4 weeks.  Today she is following up for this, states she has not noticed any change in her scalp except she thinks maybe it might be less sensitive than before.  She has not looked at her scalp or had anyone look at it.  Denies her head being itchy or in pain. Overall not bothersome to the patient other than appearance, says she would rather "just buy a wig, it would be cheaper" than seek further evaluation.   Smoking status: 1/3 pack/day.  She recently had troubles with her right payment getting messed up and now has to pay extra of this month, this has stressed her out very much and she is gone up on a few cigarettes a day since that time.  She recently started using patches that she has at home and is hoping to continue with this.  She is also going to reach out to 1-800-quit now.   She is also interested to find out how she can get the Covid vaccine.   Smoking status reviewed    Review of Systems Per HPI    Objective:  BP (!) 142/72   Pulse 65   Wt 144 lb 6.4 oz (65.5 kg)   SpO2 100%   BMI 26.41 kg/m  Vitals and nursing note reviewed  General: NAD, pleasant Respiratory: Unlabored breathing Abdomen: soft, nontender Extremities: no  edema or cyanosis. WWP. Skin: warm and dry, no rashes noted Neuro: alert and oriented, no focal deficits Psych: normal affect Skin: See media from 06/2019, appearance unchanged from this visit.  Scattered regions of scarred hypopigmentation with few erythematous macules with complete absence of remaining hair follicles.  Also note few scattered areas with multiple Beveridge macules in a circular pattern.  Nonpainful to palpation.  Assessment & Plan:   Hair loss Chronic, unchanged following terbinafine therapy after concern for concurrent tinea capitis.  Continue to suspect autoimmune disorder given appearance discussed above.  Through further review of previous clinic visits/media, note her scalp findings have been unchanged for the past approximate 4 years.  Appears she has tried several antifungal regimens in the past without any improvement then or with most recent treatment, suspect that there is no underlying tinea capitis.  Discussed trial of high potency steroid shampoo, however with expectations that the regions of complete hair loss are unlikely to grow back and she is otherwise asymptomatic, that this may not have much benefit.  She decided to monitor for now and would like to postpone her dermatology evaluation until she is more financially stable.  Smoker 1/3 ppd, going to start using patches.  Also encouraged her to reach out to 1-800-quit-now.   HYPERTENSION, BENIGN SYSTEMIC Mildly elevated today, smoked a cigarette prior to arrival.  Encouraged her  to periodically monitor her BP at home and to follow-up if BP consistently >130/80.  Will continue HCTZ 25 mg, losartan 100 mg, amlodipine 5 mg, and bisprolol 5 mg for now.    Additionally provided her with contact information of the health department and [place] website to schedule for the Covid vaccine as she meets criteria to receive this.  Follow-up in approximately 6 months or sooner if needed.   [facility]  Resident PGY-2

## 2019-08-10 NOTE — Patient Instructions (Addendum)
It was wonderful to see you as always.  The areas with full loss of hair would likely not regrow.  We decided to not try any steroids on your scalp or move forward to dermatology rotation as of now, however if you ever change your mind or have worsening of your scalp please let me know and we can get this scheduled.  [facility] health department [phone]

## 2019-08-10 NOTE — Assessment & Plan Note (Addendum)
Chronic, unchanged following terbinafine therapy after concern for concurrent tinea capitis.  Continue to suspect autoimmune disorder given appearance discussed above.  Through further review of previous clinic visits/media, note her scalp findings have been unchanged for the past approximate 4 years.  Appears she has tried several antifungal regimens in the past without any improvement then or with most recent treatment, suspect that there is no underlying tinea capitis.  Discussed trial of high potency steroid shampoo, however with expectations that the regions of complete hair loss are unlikely to grow back and she is otherwise asymptomatic, that this may not have much benefit.  She decided to monitor for now and would like to postpone her dermatology evaluation until she is more financially stable.

## 2019-08-13 ENCOUNTER — Encounter: Payer: Self-pay | Admitting: Family Medicine

## 2019-08-13 NOTE — Assessment & Plan Note (Signed)
1/3 ppd, going to start using patches.  Also encouraged her to reach out to 1-800-quit-now.

## 2019-08-13 NOTE — Assessment & Plan Note (Signed)
Mildly elevated today, smoked a cigarette prior to arrival.  Encouraged her to periodically monitor her BP at home and to follow-up if BP consistently >130/80.  Will continue HCTZ 25 mg, losartan 100 mg, amlodipine 5 mg, and bisprolol 5 mg for now.

## 2019-08-18 ENCOUNTER — Other Ambulatory Visit: Payer: Self-pay

## 2019-08-18 DIAGNOSIS — I10 Essential (primary) hypertension: Secondary | ICD-10-CM

## 2019-08-18 MED ORDER — BISOPROLOL FUMARATE 5 MG PO TABS: 5.0000 mg | ORAL_TABLET | Freq: Every day | ORAL | 11 refills | Status: DC

## 2019-10-01 ENCOUNTER — Other Ambulatory Visit: Payer: Self-pay

## 2019-10-01 NOTE — Patient Outreach (Signed)
[name] Page [facility]) Care Management  10/01/2019  [name] Page [birth year] [id number]   Medication Adherence call to [name] Page [facility] Ident[facility] Verify spoke with patient she is past due on Pravastatin 40 mg,patient explain she takes 1 tablet daily she has 14 tablets left and will call the pharmacy when due,she explain at some point she was out of refills and took a long time for doctor to approve a refill reason why the pharmacy is filling it ahead of time.   [place] Management Direct Dial [phone]  Fax [phone] [name]

## 2019-10-07 ENCOUNTER — Other Ambulatory Visit: Payer: Self-pay | Admitting: *Deleted

## 2019-10-07 MED ORDER — PRAVASTATIN SODIUM 40 MG PO TABS: 40.0000 mg | ORAL_TABLET | Freq: Every day | ORAL | 3 refills | Status: DC

## 2019-10-07 MED ORDER — HYDROCHLOROTHIAZIDE 25 MG PO TABS: ORAL_TABLET | ORAL | 3 refills | Status: DC

## 2019-10-12 ENCOUNTER — Other Ambulatory Visit: Payer: Self-pay | Admitting: *Deleted

## 2019-10-13 MED ORDER — CILOSTAZOL 100 MG PO TABS: ORAL_TABLET | ORAL | 1 refills | Status: DC

## 2019-11-02 ENCOUNTER — Other Ambulatory Visit: Payer: Self-pay | Admitting: Family Medicine

## 2019-11-02 DIAGNOSIS — Z1231 Encounter for screening mammogram for malignant neoplasm of breast: Secondary | ICD-10-CM

## 2019-12-27 NOTE — Progress Notes (Signed)
° ° °  SUBJECTIVE:   CHIEF COMPLAINT / HPI: Regular follow-up hypertension/tobacco use  Ms. Pedley is a 74 year old female presenting for follow-up.  Itchy ears: Reports off and on severe itchiness in her bilateral ear canals.  Has had before and resolved with Lotrimin solution.  She has been using Q-tips to clean.  Denies any fever, rash, ear swelling, tinnitus, or changes in hearing.   Hypertension: Patient here for follow-up of borderline controlled arterial hypertension. He/She is exercising and is adherent to a low-salt diet. She currently takes Norvasc 5 mg, HCTZ 25 mg, and bisoprolol 5 mg and is adherent to regimen.  Cardiac symptoms: none. Patient denies: chest pain, claudication, dyspnea, exertional chest pressure/discomfort, orthopnea and palpitations. Cardiovascular risk factors: advanced age (older than 39 for men, 35 for women), dyslipidemia and smoking/ tobacco exposure. Use of agents associated with hypertension: NSAIDS. History of target organ damage: none.  Tobacco use: 2-3 cigs some days, 4-5 other days.  Smokes less during the summer due to being hot outside.  Health maintenance: Due for Tdap and Covid vaccination.  DEXA scan ordered in 06/2019 however she has not completed this yet.   PERTINENT  PMH / PSH: Hypertension, COPD, osteopenia, CAD, current tobacco use, hyperlipidemia, polyarthralgias and hair loss  OBJECTIVE:   BP (!) 142/74    Pulse 79    Ht 5\' 2"  (1.575 m)    Wt 145 lb 9.6 oz (66 kg)    SpO2 94%    BMI 26.63 kg/m   General: Alert, NAD HEENT: NCAT, MMM, bilateral external canals with proximal circumferential skin flakiness and crusting with mild erythema.  No drainage noted.  No pain with pulling of pinna bilaterally.  B/L tympanic membranes clear with appropriate light reflex. Cardiac: RRR, 2/6 systolic murmur best heard in the right second intercostal space Lungs: Clear bilaterally, no increased WOB   Abdomen: soft Msk: Moves all extremities spontaneously    Ext: Warm, dry, 2+ distal pulses  ASSESSMENT/PLAN:   Otomycosis of both ears Suspected due to pruritis and fungal appearing rash within bilateral canals.  Reassuringly without concurrent TM abnormalities.  Will trial clotrimazole 1% solution BID for the next 2 weeks.  Follow-up if not improving or sooner if worsening.  Smoker 2-5 cigarettes daily, depending on day.  In contemplation phase currently.  Recommend reaching out to 1-800-quit-now.   HYPERTENSION, BENIGN SYSTEMIC Borderline control, goal around 130/80.  Will increase amlodipine to 10 mg from 5.  Continue HCTZ 25 mg, losartan 100 mg, and bisoprolol 5 mg as is.  Encourage tobacco cessation and sparing use of Celebrex.  Osteopenia Discussed obtaining updated DEXA scan.  At increased risk for worsening given age and use of PPI.  Provided with breast center contact information to schedule appointment.  Taking multivitamin and performing weightbearing activities.  Healthcare maintenance Sent prescription for Tdap vaccine to her pharmacy, as this would not be covered under Medicare within our clinic.    Follow-up in 6 months or sooner if needed.   Patriciaann Clan, [place]

## 2019-12-28 ENCOUNTER — Ambulatory Visit (INDEPENDENT_AMBULATORY_CARE_PROVIDER_SITE_OTHER): Payer: Medicare Other | Admitting: Family Medicine

## 2019-12-28 ENCOUNTER — Other Ambulatory Visit: Payer: Self-pay

## 2019-12-28 ENCOUNTER — Encounter: Payer: Self-pay | Admitting: Family Medicine

## 2019-12-28 VITALS — BP 142/74 | HR 79 | Ht 62.0 in | Wt 145.6 lb

## 2019-12-28 DIAGNOSIS — F17200 Nicotine dependence, unspecified, uncomplicated: Secondary | ICD-10-CM

## 2019-12-28 DIAGNOSIS — I10 Essential (primary) hypertension: Secondary | ICD-10-CM | POA: Diagnosis not present

## 2019-12-28 DIAGNOSIS — Z0000 Encounter for general adult medical examination without abnormal findings: Secondary | ICD-10-CM | POA: Diagnosis not present

## 2019-12-28 DIAGNOSIS — B369 Superficial mycosis, unspecified: Secondary | ICD-10-CM

## 2019-12-28 DIAGNOSIS — H6243 Otitis externa in other diseases classified elsewhere, bilateral: Secondary | ICD-10-CM

## 2019-12-28 DIAGNOSIS — K219 Gastro-esophageal reflux disease without esophagitis: Secondary | ICD-10-CM | POA: Diagnosis not present

## 2019-12-28 DIAGNOSIS — M8589 Other specified disorders of bone density and structure, multiple sites: Secondary | ICD-10-CM

## 2019-12-28 DIAGNOSIS — H6240 Otitis externa in other diseases classified elsewhere, unspecified ear: Secondary | ICD-10-CM

## 2019-12-28 HISTORY — DX: Superficial mycosis, unspecified: B36.9

## 2019-12-28 MED ORDER — TETANUS-DIPHTH-ACELL PERTUSSIS 5-2.5-18.5 LF-MCG/0.5 IM SUSP: 0.5000 mL | Freq: Once | INTRAMUSCULAR | 0 refills | Status: AC

## 2019-12-28 MED ORDER — CLOTRIMAZOLE 1 % EX SOLN: 1.0000 "application " | Freq: Two times a day (BID) | CUTANEOUS | 0 refills | Status: AC

## 2019-12-28 MED ORDER — OMEPRAZOLE 40 MG PO CPDR: 40.0000 mg | DELAYED_RELEASE_CAPSULE | ORAL | 2 refills | Status: DC | PRN

## 2019-12-28 NOTE — Assessment & Plan Note (Addendum)
Discussed obtaining updated DEXA scan.  At increased risk for worsening given age and use of PPI.  Provided with breast center contact information to schedule appointment.  Taking multivitamin and performing weightbearing activities.

## 2019-12-28 NOTE — Assessment & Plan Note (Signed)
Suspected due to pruritis and fungal appearing rash within bilateral canals.  Reassuringly without concurrent TM abnormalities.  Will trial clotrimazole 1% solution BID for the next 2 weeks.  Follow-up if not improving or sooner if worsening.

## 2019-12-28 NOTE — Assessment & Plan Note (Signed)
Sent prescription for Tdap vaccine to her pharmacy, as this would not be covered under Medicare within our clinic.

## 2019-12-28 NOTE — Assessment & Plan Note (Signed)
Borderline control, goal around 130/80.  Will increase amlodipine to 10 mg from 5.  Continue HCTZ 25 mg, losartan 100 mg, and bisoprolol 5 mg as is.  Encourage tobacco cessation and sparing use of Celebrex.

## 2019-12-28 NOTE — Patient Instructions (Signed)
Wonderful to see you today as always.  I encourage you to continue working towards quitting smoking.  You can always call 100 quit now to help with additional assistance.  We are going to increase your Norvasc from 5 mg to 10 mg, when you are able to get your blood pressure cuff please try to take it every week.  Your goal blood pressure is around under 130/80.  If you are feeling lightheaded/dizzy, headaches, or feeling like you will pass out please let me know.  You should also get your DEXA scan to evaluate for osteoporosis.  I have sent in a prescription for Tdap, this is your vaccine against tetanus and whooping cough.

## 2019-12-28 NOTE — Assessment & Plan Note (Signed)
2-5 cigarettes daily, depending on day.  In contemplation phase currently.  Recommend reaching out to 1-800-quit-now.

## 2019-12-31 ENCOUNTER — Other Ambulatory Visit: Payer: Self-pay

## 2019-12-31 DIAGNOSIS — K219 Gastro-esophageal reflux disease without esophagitis: Secondary | ICD-10-CM

## 2019-12-31 MED ORDER — FAMOTIDINE 20 MG PO TABS: 20.0000 mg | ORAL_TABLET | Freq: Two times a day (BID) | ORAL | 3 refills | Status: DC

## 2020-01-04 ENCOUNTER — Other Ambulatory Visit: Payer: Self-pay | Admitting: Family Medicine

## 2020-01-12 ENCOUNTER — Other Ambulatory Visit: Payer: Self-pay | Admitting: *Deleted

## 2020-01-12 DIAGNOSIS — I10 Essential (primary) hypertension: Secondary | ICD-10-CM

## 2020-01-12 MED ORDER — AMLODIPINE BESYLATE 10 MG PO TABS: 10.0000 mg | ORAL_TABLET | Freq: Every day | ORAL | 3 refills | Status: DC

## 2020-04-03 ENCOUNTER — Other Ambulatory Visit: Payer: Self-pay | Admitting: Family Medicine

## 2020-04-03 DIAGNOSIS — K219 Gastro-esophageal reflux disease without esophagitis: Secondary | ICD-10-CM

## 2020-04-11 ENCOUNTER — Other Ambulatory Visit: Payer: Self-pay | Admitting: Family Medicine

## 2020-05-09 ENCOUNTER — Other Ambulatory Visit: Payer: Self-pay

## 2020-05-10 ENCOUNTER — Other Ambulatory Visit: Payer: Self-pay | Admitting: Family Medicine

## 2020-05-10 DIAGNOSIS — I10 Essential (primary) hypertension: Secondary | ICD-10-CM

## 2020-05-10 MED ORDER — CETIRIZINE HCL 10 MG PO TABS
ORAL_TABLET | ORAL | 3 refills | Status: DC
Start: 2020-05-10 — End: 2021-06-13

## 2020-05-31 ENCOUNTER — Other Ambulatory Visit: Payer: Self-pay | Admitting: Family Medicine

## 2020-05-31 DIAGNOSIS — K219 Gastro-esophageal reflux disease without esophagitis: Secondary | ICD-10-CM

## 2020-06-29 ENCOUNTER — Ambulatory Visit (INDEPENDENT_AMBULATORY_CARE_PROVIDER_SITE_OTHER): Payer: Medicare Other | Admitting: Family Medicine

## 2020-06-29 ENCOUNTER — Other Ambulatory Visit: Payer: Self-pay

## 2020-06-29 VITALS — BP 127/80 | HR 78 | Ht 62.0 in | Wt 143.6 lb

## 2020-06-29 DIAGNOSIS — I2510 Atherosclerotic heart disease of native coronary artery without angina pectoris: Secondary | ICD-10-CM | POA: Diagnosis not present

## 2020-06-29 DIAGNOSIS — Z23 Encounter for immunization: Secondary | ICD-10-CM

## 2020-06-29 DIAGNOSIS — E7800 Pure hypercholesterolemia, unspecified: Secondary | ICD-10-CM

## 2020-06-29 DIAGNOSIS — F17200 Nicotine dependence, unspecified, uncomplicated: Secondary | ICD-10-CM

## 2020-06-29 DIAGNOSIS — L659 Nonscarring hair loss, unspecified: Secondary | ICD-10-CM

## 2020-06-29 DIAGNOSIS — I10 Essential (primary) hypertension: Secondary | ICD-10-CM

## 2020-06-29 DIAGNOSIS — K219 Gastro-esophageal reflux disease without esophagitis: Secondary | ICD-10-CM

## 2020-06-29 DIAGNOSIS — R011 Cardiac murmur, unspecified: Secondary | ICD-10-CM

## 2020-06-29 DIAGNOSIS — Z0000 Encounter for general adult medical examination without abnormal findings: Secondary | ICD-10-CM

## 2020-06-29 MED ORDER — PRAVASTATIN SODIUM 40 MG PO TABS: 40.0000 mg | ORAL_TABLET | Freq: Every day | ORAL | 3 refills | Status: DC

## 2020-06-29 MED ORDER — HYDROCHLOROTHIAZIDE 25 MG PO TABS: ORAL_TABLET | ORAL | 3 refills | Status: DC

## 2020-06-29 MED ORDER — BISOPROLOL FUMARATE 5 MG PO TABS: 5.0000 mg | ORAL_TABLET | Freq: Every day | ORAL | 3 refills | Status: DC

## 2020-06-29 NOTE — Patient Instructions (Signed)
It was wonderful to see you today.  Your blood pressure looks wonderful.  We are to get you scheduled for an echocardiogram, this is an ultrasound of your heart.  I encourage you to keep up the good work with slowly but surely decreasing the amount of cigarettes you use every day.  You can use Vaseline on your scalp to help with dry areas.  I will always be happy to place a dermatology referral if you decide you would like to see them for further evaluation.  Transportation Resources: [employer]-  Please contact your provider to see if they offer transportation to medical appointments. The phone number is listed on the back of your card.  SCAT: Only in [place] limits. Pay 1.50 per way.   Must complete an application and meet medical guidelines.   Call [phone] or visit  http://[email or web address].gov/index.aspx?page=2188  [employer] ([place] offers Discount Bus Fare to riders with Medicaid or Medicare Care   call   [phone] for discount price. ______________________________________________________________________  Nurse, children's   [employer] 35 years and older Department of Social Services  [phone]  [employer] Medical Transportation Age 1 and older [phone] [place] for non-medical rides,  Age 20 or older   [phone]  [employer] only --Dial a Ride for medical and non-medical,  Age 39 or older [place]:   [phone]

## 2020-06-29 NOTE — Progress Notes (Signed)
    SUBJECTIVE:   CHIEF COMPLAINT / HPI: Hypertension follow-up  Ms. Manocchio is a 74 year old female presenting for follow-up. Reports she is overall doing well.   Hypertension: Blood pressure is well controlled at home. She is trying to stay active with walking and is adherent to a low-salt diet. She currently takes norvasc 10mg , bisoprolol 5mg , losartan 100mg , and HCTZ 25mg , and is adherent to regimen.  Cardiac symptoms: none. Patient denies: chest pain, dyspnea, exertional chest pressure/discomfort, fatigue, irregular heart beat, palpitations, syncope and lightheadedness/dizziness. Cardiovascular risk factors: advanced age (older than 9 for men, 54 for women), hypertension and smoking/ tobacco exposure. Use of agents associated with hypertension: NSAIDS. History of target organ damage: angina/ prior myocardial infarction (1997/2001).  Scalp irritation: Known patches of chronic hair loss without further evaluation through dermatology per patient request in the past. However, recently in the past few weeks her scalp has been more sensitive and painful to the touch on the top. She has been using neosporin to keep the top of her head moist.   Smoking: now 2 cigarettes/day since it is cold outside. Uses this as a stress relief due to tense family dynamics.   GERD: pepcid twice daily. Now only needs PPI 1-2 times a month.   PERTINENT  PMH / PSH: Hypertension, COPD, osteopenia, CAD/distant MI, current tobacco use, hyperlipidemia, polyarthralgias and hair loss  OBJECTIVE:   BP 127/80   Pulse 78   Ht 5\' 2"  (1.575 m)   Wt 143 lb 9.6 oz (65.1 kg)   SpO2 96%   BMI 26.26 kg/m   General: Alert, NAD HEENT: NCAT, MMM, oropharynx nonerythematous  Cardiac: RRR, 2/6 systolic murmur best heard in the right 2nd intercostal space  Lungs: Clear bilaterally, no increased WOB, no wheezing or rhonchi  Msk: Moves all extremities spontaneously  Ext: Warm, dry, 2+ distal pulses, no edema  Derm Scalp: Scattered  regions of scarred hypopigmentation with complete absence of remaining hair follicles. Few areas of multiple Hel[place] pinpoint macules in circular pattern. No change in appearance from media tab photos in 06/2019.   ASSESSMENT/PLAN:   Essential hypertension Well controlled. Will continue regimen as mentioned above, placed refills. Obtain BMP. Intermittently monitor BP at home PRN.   GERD (gastroesophageal reflux disease) Chronic and stable. Pepcid BID, fortunately now using PPI very sparingly.   Smoker Minimal. Provided encouragement for complete cessation, still appears in contemplation phase.   Systolic murmur Known history of this (unclear etiology however), but has not been evaluated in quite some time. Asymptomatic. Will obtain updated Echocardiogram to ensure no substantial valvular abnormality, r/o significant AS. Check CBC to r/o contributing anemia. Pending results, may need to re-establish care with cardiology.   Hair loss Unchanged from previous, continue to have suspicion for autoimmune cause. Doubt concurrent tinea despite appearance given numerous trials of anti-fungals in the past several years without improvement. Recommended stopping neosporin as this is likely irritating her scalp. May trial alternative moisturizer. Re-discussed dermatology referral, she is going to think about this further and let me know if she would like to proceed.    Healthcare maintenance Received flu shot today.   HYPERCHOLESTEROLEMIA Check lipid panel. Cont pravastatin as is.     F/u in 3 months or sooner if needed. Scheduled with nurse's clinic for COVID booster vaccine on 12/14.   Patriciaann Clan, [place]

## 2020-06-30 ENCOUNTER — Encounter: Payer: Self-pay | Admitting: Family Medicine

## 2020-06-30 DIAGNOSIS — R011 Cardiac murmur, unspecified: Secondary | ICD-10-CM | POA: Insufficient documentation

## 2020-06-30 LAB — CBC
Hematocrit: 39.9 % (ref 34.0–46.6)
Hemoglobin: 13.3 g/dL (ref 11.1–15.9)
MCH: 32.4 pg (ref 26.6–33.0)
MCHC: 33.3 g/dL (ref 31.5–35.7)
MCV: 97 fL (ref 79–97)
Platelets: 253 10*3/uL (ref 150–450)
RBC: 4.1 x10E6/uL (ref 3.77–5.28)
RDW: 13.1 % (ref 11.7–15.4)
WBC: 6.9 10*3/uL (ref 3.4–10.8)

## 2020-06-30 LAB — BASIC METABOLIC PANEL
BUN/Creatinine Ratio: 8 — ABNORMAL LOW (ref 12–28)
BUN: 7 mg/dL — ABNORMAL LOW (ref 8–27)
CO2: 25 mmol/L (ref 20–29)
Calcium: 10.3 mg/dL (ref 8.7–10.3)
Chloride: 92 mmol/L — ABNORMAL LOW (ref 96–106)
Creatinine, Ser: 0.83 mg/dL (ref 0.57–1.00)
GFR calc Af Amer: 80 mL/min/{1.73_m2} (ref >59)
GFR calc non Af Amer: 70 mL/min/{1.73_m2} (ref >59)
Glucose: 76 mg/dL (ref 65–99)
Potassium: 3.8 mmol/L (ref 3.5–5.2)
Sodium: 134 mmol/L (ref 134–144)

## 2020-06-30 LAB — LIPID PANEL
Chol/HDL Ratio: 2.5 ratio (ref 0.0–4.4)
Cholesterol, Total: 157 mg/dL (ref 100–199)
HDL: 63 mg/dL (ref >39)
LDL Chol Calc (NIH): 78 mg/dL (ref 0–99)
Triglycerides: 86 mg/dL (ref 0–149)
VLDL Cholesterol Cal: 16 mg/dL (ref 5–40)

## 2020-06-30 NOTE — Assessment & Plan Note (Addendum)
Known history of this (unclear etiology however), but has not been evaluated in quite some time. Asymptomatic. Will obtain updated Echocardiogram to ensure no substantial valvular abnormality, r/o significant AS. Check CBC to r/o contributing anemia. Pending results, may need to re-establish care with cardiology.

## 2020-06-30 NOTE — Assessment & Plan Note (Signed)
Minimal. Provided encouragement for complete cessation, still appears in contemplation phase.

## 2020-06-30 NOTE — Assessment & Plan Note (Signed)
Received flu shot today. 

## 2020-06-30 NOTE — Assessment & Plan Note (Signed)
Chronic and stable. Pepcid BID, fortunately now using PPI very sparingly.

## 2020-06-30 NOTE — Assessment & Plan Note (Signed)
Well controlled. Will continue regimen as mentioned above, placed refills. Obtain BMP. Intermittently monitor BP at home PRN.

## 2020-06-30 NOTE — Assessment & Plan Note (Signed)
Unchanged from previous, continue to have suspicion for autoimmune cause. Doubt concurrent tinea despite appearance given numerous trials of anti-fungals in the past several years without improvement. Recommended stopping neosporin as this is likely irritating her scalp. May trial alternative moisturizer. Re-discussed dermatology referral, she is going to think about this further and let me know if she would like to proceed.

## 2020-06-30 NOTE — Assessment & Plan Note (Signed)
Check lipid panel. Cont pravastatin as is.

## 2020-07-05 ENCOUNTER — Other Ambulatory Visit: Payer: Self-pay

## 2020-07-05 ENCOUNTER — Ambulatory Visit (INDEPENDENT_AMBULATORY_CARE_PROVIDER_SITE_OTHER): Payer: Medicare Other

## 2020-07-05 DIAGNOSIS — Z23 Encounter for immunization: Secondary | ICD-10-CM | POA: Diagnosis not present

## 2020-07-05 NOTE — Progress Notes (Signed)
   Covid-19 Vaccination Clinic  Name:  JANNEY PRIEGO    MRN: [id number] DOB: 1946/05/20  07/05/2020  Ms. Moody was observed post Covid-19 immunization for 15 minutes without incident. She was provided with Vaccine Information Sheet and instruction to access the V-Safe system.   Ms. Lathon was instructed to call 911 with any severe reactions post vaccine: Marland Kitchen Difficulty breathing  . Swelling of face and throat  . A fast heartbeat  . A bad rash all over body  . Dizziness and weakness   Booster administered LD without complication.

## 2020-07-12 ENCOUNTER — Other Ambulatory Visit: Payer: Self-pay

## 2020-07-12 ENCOUNTER — Ambulatory Visit (HOSPITAL_COMMUNITY)
Admission: RE | Admit: 2020-07-12 | Discharge: 2020-07-12 | Disposition: A | Discharge status: Home / Self Care | Payer: Medicare Other | Source: Ambulatory Visit | Attending: Family Medicine | Admitting: Family Medicine

## 2020-07-12 DIAGNOSIS — E785 Hyperlipidemia, unspecified: Secondary | ICD-10-CM | POA: Diagnosis not present

## 2020-07-12 DIAGNOSIS — I700 Atherosclerosis of aorta: Secondary | ICD-10-CM | POA: Insufficient documentation

## 2020-07-12 DIAGNOSIS — I509 Heart failure, unspecified: Secondary | ICD-10-CM | POA: Insufficient documentation

## 2020-07-12 DIAGNOSIS — I110 Hypertensive heart disease with heart failure: Secondary | ICD-10-CM | POA: Insufficient documentation

## 2020-07-12 DIAGNOSIS — J449 Chronic obstructive pulmonary disease, unspecified: Secondary | ICD-10-CM | POA: Insufficient documentation

## 2020-07-12 DIAGNOSIS — R011 Cardiac murmur, unspecified: Secondary | ICD-10-CM

## 2020-07-12 LAB — ECHOCARDIOGRAM COMPLETE
Area-P 1/2: 2.99 cm2
Calc EF: 61.8 %
S' Lateral: 2.7 cm
Single Plane A2C EF: 65.1 %
Single Plane A4C EF: 56.5 %

## 2020-07-12 NOTE — Progress Notes (Signed)
  Echocardiogram 2D Echocardiogram has been performed.  Jennette Dubin 07/12/2020, 2:51 PM

## 2020-08-29 ENCOUNTER — Other Ambulatory Visit: Payer: Self-pay | Admitting: Family Medicine

## 2020-08-29 DIAGNOSIS — I10 Essential (primary) hypertension: Secondary | ICD-10-CM

## 2020-09-27 ENCOUNTER — Ambulatory Visit (HOSPITAL_COMMUNITY)
Admission: RE | Admit: 2020-09-27 | Discharge: 2020-09-27 | Disposition: A | Discharge status: Home / Self Care | Payer: Medicare Other | Source: Ambulatory Visit | Attending: Family Medicine | Admitting: Family Medicine

## 2020-09-27 ENCOUNTER — Ambulatory Visit (INDEPENDENT_AMBULATORY_CARE_PROVIDER_SITE_OTHER): Payer: Medicare Other | Admitting: Family Medicine

## 2020-09-27 ENCOUNTER — Encounter: Payer: Self-pay | Admitting: Family Medicine

## 2020-09-27 ENCOUNTER — Other Ambulatory Visit: Payer: Self-pay

## 2020-09-27 VITALS — BP 122/72 | HR 77 | Wt 144.2 lb

## 2020-09-27 DIAGNOSIS — I2510 Atherosclerotic heart disease of native coronary artery without angina pectoris: Secondary | ICD-10-CM

## 2020-09-27 DIAGNOSIS — L853 Xerosis cutis: Secondary | ICD-10-CM

## 2020-09-27 DIAGNOSIS — I10 Essential (primary) hypertension: Secondary | ICD-10-CM | POA: Diagnosis not present

## 2020-09-27 DIAGNOSIS — R011 Cardiac murmur, unspecified: Secondary | ICD-10-CM

## 2020-09-27 DIAGNOSIS — F17200 Nicotine dependence, unspecified, uncomplicated: Secondary | ICD-10-CM | POA: Diagnosis not present

## 2020-09-27 DIAGNOSIS — M2550 Pain in unspecified joint: Secondary | ICD-10-CM | POA: Diagnosis not present

## 2020-09-27 DIAGNOSIS — L210 Seborrhea capitis: Secondary | ICD-10-CM | POA: Diagnosis not present

## 2020-09-27 DIAGNOSIS — R0789 Other chest pain: Secondary | ICD-10-CM

## 2020-09-27 MED ORDER — HYDROCORTISONE 2.5 % EX OINT: TOPICAL_OINTMENT | Freq: Two times a day (BID) | CUTANEOUS | 3 refills | Status: DC

## 2020-09-27 NOTE — Patient Instructions (Signed)
I will place a referral to cardiology (heart doctors). If have any further chest pain similar to this, shortness of breath, or lightheadedness/dizziness-- please go to the ED.   Use hydrocortisone (twice daily) for the next 1-2 weeks on the outer portion of your ears. No q-tips.   Follow up in 1 month or sooner if needed.

## 2020-09-27 NOTE — Progress Notes (Signed)
SUBJECTIVE:   CHIEF COMPLAINT / HPI: Check up BP   [name] Page is a 75 year old female presenting for hypertension follow-up.  Hypertension: Well-controlled at home.  She takes Norvasc 10 mg, bisoprolol 5 mg, losartan 100 mg, and HCTZ 25 mg.  She reports compliance with regimen.  She is trying to stay active as she can with walking and works towards a low-salt diet.  She endorses intermittent chest pain today (discussed below), however denies any dyspnea, fatigue, irregular heartbeat, palpitations, syncope, lightheadedness/dizziness.  Risk factors including age, hypertension, tobacco use, chronic NSAID use, PAD on Pletal, and CAD with previous MI in 1997/2001.  Chest pain: Since December, has had intermittent chest pain. 2-3 episodes since December after getting her echo, chest pressure "like someone is pressing on my chest".  States it feels similar to the pain that she "used to use a nitro for and it would go away". Does not have any nitro at home. Previously MI as stated above, one stent placed and two previous caths per patient report. Lasts 10-20 minutes, goes away on its own when she goes to sit somewhere quietly.  Does not have any SOB, clamminess, lightheadedness/dizziness, cough/wheezing, or paresthesias associated with it.  One time just watching tv, other time making lunch.  She continues to be able to walk to her mailbox >574ft without issue.  She does have a history of heartburn/indigestion, feels like this is different.  However, states with one episode she also had concurrent heartburn and took a Tums which made both of her pains go away after time.  Itchy ears: Feels like her ears have been flaking in the canal for the past 1-2 weeks.  She used leftover fungal drops but feels like this made it a little bit worse.  PERTINENT  PMH / PSH: Hypertension, COPD,osteopenia, CAD/distant MI, current tobacco use, hyperlipidemia,polyarthralgias and hair loss  OBJECTIVE:   BP 122/72   Pulse  77   Wt 144 lb 3.2 oz (65.4 kg)   SpO2 98%   BMI 26.37 kg/m   General: Alert, NAD HEENT: NCAT, MMM, bilateral external ear canal with dry flaky skin, no erythema or swelling noted to both canals.  Bilateral TM clear with appropriate light reflex. Cardiac: RRR, 2/6 systolic murmur best heard in the right second intercostal space, non-tender to palpation of chest wall.  Lungs: Clear bilaterally, no increased WOB on RA  Msk: Moves all extremities spontaneously, normal gait   Ext: Warm, dry, 2+ distal pulses, no edema b/l   ASSESSMENT/PLAN:   Atypical chest pain Few self resolving episodes since 06/2020.  Reassuringly EKG today NSR and similar to previous (with unremarkable echo in 06/2020 as well), no new ischemic changes.  Given her chest pain description and known CAD with previous MI, will refer to cardiology for further evaluation and work-up as needed.  While initially do have concern for anginal etiology, could also consider related to her GERD/esophageal spasm as Tums seemed to improve during one episode.  May also have component of anxiety after started shortly after her echo, she has been worried about her heart after this test. Check CBC to ensure no new anemia contributing.   Essential hypertension Well-controlled.  Will continue regimen as mentioned above. Monitor BP at home as needed.  Polyarthralgia Fortunately now taking Celebrex on a more rare basis.  Discussed importance of limiting NSAIDs as much as possible given her known CAD history.  Smoker Still in contemplation phase.  Will continue provide encouragement when she is ready.  Dry skin dermatitis Present on bilateral external ear canals without evidence of bacterial or fungal otitis externa.  Rx'd hydrocortisone 2.5% to use twice daily on this region for the next 1-2 weeks.  Follow-up if not improving.    ED precautions discussed including any recurrence of chest pain episode.  Follow-up with myself in 1 month or if  able to establish with cardiology, follow-up in 3 months.  Patriciaann Clan, [place]

## 2020-09-28 ENCOUNTER — Encounter: Payer: Self-pay | Admitting: Family Medicine

## 2020-09-28 DIAGNOSIS — R0789 Other chest pain: Secondary | ICD-10-CM | POA: Insufficient documentation

## 2020-09-28 DIAGNOSIS — L853 Xerosis cutis: Secondary | ICD-10-CM | POA: Insufficient documentation

## 2020-09-28 LAB — CBC WITH DIFFERENTIAL/PLATELET
Basophils Absolute: 0.1 10*3/uL (ref 0.0–0.2)
Basos: 1 %
EOS (ABSOLUTE): 0.2 10*3/uL (ref 0.0–0.4)
Eos: 3 %
Hematocrit: 37.6 % (ref 34.0–46.6)
Hemoglobin: 12.4 g/dL (ref 11.1–15.9)
Immature Grans (Abs): 0 10*3/uL (ref 0.0–0.1)
Immature Granulocytes: 0 %
Lymphocytes Absolute: 2.4 10*3/uL (ref 0.7–3.1)
Lymphs: 35 %
MCH: 32.5 pg (ref 26.6–33.0)
MCHC: 33 g/dL (ref 31.5–35.7)
MCV: 98 fL — ABNORMAL HIGH (ref 79–97)
Monocytes Absolute: 0.6 10*3/uL (ref 0.1–0.9)
Monocytes: 8 %
Neutrophils Absolute: 3.5 10*3/uL (ref 1.4–7.0)
Neutrophils: 53 %
Platelets: 252 10*3/uL (ref 150–450)
RBC: 3.82 x10E6/uL (ref 3.77–5.28)
RDW: 12.3 % (ref 11.7–15.4)
WBC: 6.8 10*3/uL (ref 3.4–10.8)

## 2020-09-28 NOTE — Assessment & Plan Note (Signed)
Well-controlled.  Will continue regimen as mentioned above. Monitor BP at home as needed.

## 2020-09-28 NOTE — Assessment & Plan Note (Signed)
Fortunately now taking Celebrex on a more rare basis.  Discussed importance of limiting NSAIDs as much as possible given her known CAD history.

## 2020-09-28 NOTE — Assessment & Plan Note (Signed)
Still in contemplation phase.  Will continue provide encouragement when she is ready.

## 2020-09-28 NOTE — Assessment & Plan Note (Signed)
Present on bilateral external ear canals without evidence of bacterial or fungal otitis externa.  Rx'd hydrocortisone 2.5% to use twice daily on this region for the next 1-2 weeks.  Follow-up if not improving.

## 2020-09-28 NOTE — Assessment & Plan Note (Addendum)
Few self resolving episodes since 06/2020.  Reassuringly EKG today NSR and similar to previous (with unremarkable echo in 06/2020 as well), no new ischemic changes.  Given her chest pain description and known CAD with previous MI, will refer to cardiology for further evaluation and work-up as needed.  While initially do have concern for anginal etiology, could also consider related to her GERD/esophageal spasm as Tums seemed to improve during one episode.  May also have component of anxiety after started shortly after her echo, she has been worried about her heart after this test. Check CBC to ensure no new anemia contributing.

## 2020-10-11 ENCOUNTER — Telehealth: Payer: Self-pay

## 2020-10-11 ENCOUNTER — Other Ambulatory Visit: Payer: Self-pay | Admitting: Family Medicine

## 2020-10-11 DIAGNOSIS — M2550 Pain in unspecified joint: Secondary | ICD-10-CM

## 2020-10-11 MED ORDER — DICLOFENAC SODIUM 1 % EX GEL: 4.0000 g | Freq: Four times a day (QID) | CUTANEOUS | 3 refills | Status: DC | PRN

## 2020-10-11 NOTE — Telephone Encounter (Signed)
Rx sent. Thank you.

## 2020-10-11 NOTE — Telephone Encounter (Signed)
Received fax from [facility], requesting the following medication for refill, medication was last filled 03/27/2019;    Diclofenac 1% gel Apply 4 grams topically 4 times daily as needed   Ottis Stain, CMA

## 2020-10-12 ENCOUNTER — Other Ambulatory Visit: Payer: Self-pay | Admitting: *Deleted

## 2020-10-12 DIAGNOSIS — K219 Gastro-esophageal reflux disease without esophagitis: Secondary | ICD-10-CM

## 2020-10-12 MED ORDER — FAMOTIDINE 20 MG PO TABS: ORAL_TABLET | ORAL | 3 refills | Status: DC

## 2020-10-15 NOTE — Progress Notes (Signed)
Cardiology Office Note:    Date:  10/17/2020   ID:  [name] Page, DOB 06/23/1946, MRN [id number]  PCP:  Clemetine Marker   [facility]  Cardiologist:  No primary care provider on file.  Advanced Practice Provider:  No care team member to display Electrophysiologist:  None    Referring MD: Martyn Malay, MD     History of Present Illness:    [name] Page is a 75 y.o. female with a hx of HTN, COPD, coronary artery disease with history of MI, and HLD who was referred by Dr. Owens Shark for further evaluation of episode of chest pressure.  The patient states that she has a history of MI in 1997 where a PCI x2. Has been doing well since that time. No chest pain with exertion, SOB, lightheadedness, dizziness, syncope. No LE edema, orthopnea, or PND. States she had an episode of chest discomfort after having the echo done when they pressed hard on her chest. This lasted a couple of days and then resolved and has not recurred. Denies exertional symptoms or decreased exercise capacity. TTE with normal LVEF, moderate LVH, moderate MAC, aortic sclerosis, RAP 57mHg.  Past Medical History:  Diagnosis Date  . Allergic rhinitis 09/19/2006   Qualifier: Diagnosis of  By: HHenderson BaltimoreMD, JJanett Billow   . Allergy   . Arthritis   . Chai tarry stools    from iron pills  . CHF (congestive heart failure) ([facility] 1997  . Colon polyps 2005  . COPD (chronic obstructive pulmonary disease) ([facility]   . GERD (gastroesophageal reflux disease)   . Heart attack ([facility] 1997 and 2001  . Hyperlipidemia   . Hypertension   . Otomycosis of both ears 12/28/2019  . Pneumonia 2012  . Status post dilation of esophageal narrowing     Past Surgical History:  Procedure Laterality Date  . CESAREAN SECTION  01/1976  . COLONOSCOPY    . CORONARY ANGIOPLASTY WITH STENT PLACEMENT  2002  . POLYPECTOMY      Current Medications: Current Meds  Medication Sig  . amLODipine (NORVASC) 10 MG tablet  TAKE 1 TABLET(10 MG) BY MOUTH DAILY  . aspirin EC 81 MG tablet Take 81 mg by mouth daily.  . bisoprolol (ZEBETA) 5 MG tablet TAKE 1 TABLET(5 MG) BY MOUTH DAILY  . cetirizine (ZYRTEC) 10 MG tablet TAKE 1 TABLET(10 MG) BY MOUTH DAILY  . cilostazol (PLETAL) 100 MG tablet TAKE 1 TABLET(100 MG) BY MOUTH TWICE DAILY  . diclofenac Sodium (VOLTAREN) 1 % GEL Apply 4 g topically 4 (four) times daily as needed.  . famotidine (PEPCID) 20 MG tablet TAKE 1 TABLET(20 MG) BY MOUTH TWICE DAILY  . hydrochlorothiazide (HYDRODIURIL) 25 MG tablet TAKE 1 TABLET(25 MG) BY MOUTH DAILY  . hydrocortisone 2.5 % ointment Apply topically 2 (two) times daily. As needed for outer portion of ear canal.  Do not use for more than 1-2 weeks at a time.  .Marland Kitchenlosartan (COZAAR) 100 MG tablet TAKE 1 TABLET(100 MG) BY MOUTH DAILY  . omeprazole (PRILOSEC) 40 MG capsule Take 1 capsule (40 mg total) by mouth as needed.  . pravastatin (PRAVACHOL) 40 MG tablet Take 1 tablet (40 mg total) by mouth daily.  .Marland Kitchenspironolactone (ALDACTONE) 25 MG tablet Take 1 tablet (25 mg total) by mouth daily.     Allergies:   Lisinopril and Penicillins   Social History   Socioeconomic History  . Marital status: Legally Separated    Spouse name:  Edward  . Number of children: 2  . Years of education: 50  . Highest education level: Not on file  Occupational History  . 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 Smoking status: Current Every Day Smoker    Packs/day: 0.25    Years: 40.00    Pack years: 10.00    Types: Cigarettes  . Smokeless tobacco: Never Used  . Tobacco comment: working on quitting  Vaping Use  . Vaping Use: Never used  Substance and Sexual Activity  . Alcohol use: No  . Drug use: No  . Sexual activity: Not Currently  Other Topics Concern  . Not on file  Social History Narrative   Patient lives alone here in [place].    Patient has close relationships with her daughter, Sharrie Rothman, and surrounding family  in the area.    Patient enjoys cooking and writing poetry.    Patient is separated from her husband, however they maintain a close friendship.    Social Determinants of Health   Financial Resource Strain: Not on file  Food Insecurity: Not on file  Transportation Needs: Not on file  Physical Activity: Not on file  Stress: Not on file  Social Connections: Not on file     Family History: The patient's family history includes Cancer in her mother; Clotting disorder in her brother; Colon cancer (age of onset: 15) in her mother; Diabetes in her sister; Esophageal cancer in her sister; Heart disease in her father. There is no history of Rectal cancer or Stomach cancer.  ROS:   Please see the history of present illness.    Review of Systems  Constitutional: Negative for chills and fever.  HENT: Negative for hearing loss.   Eyes: Negative for blurred vision and redness.  Respiratory: Negative for shortness of breath.   Cardiovascular: Negative for chest pain, palpitations, orthopnea, claudication, leg swelling and PND.  Gastrointestinal: Negative for nausea and vomiting.  Genitourinary: Negative for dysuria and flank pain.  Musculoskeletal: Positive for joint pain.  Neurological: Negative for dizziness and loss of consciousness.  Endo/Heme/Allergies: Negative for polydipsia.  Psychiatric/Behavioral: Negative for substance abuse.    EKGs/Labs/Other Studies Reviewed:    The following studies were reviewed today: TTE 07-27-20: 1. Left ventricular ejection fraction, by estimation, is 60 to 65%. The  left ventricle has normal function. The left ventricle has no regional  wall motion abnormalities. There is moderate left ventricular hypertrophy  of the basal-septal segment. Left  ventricular diastolic parameters are consistent with Grade I diastolic  dysfunction (impaired relaxation). Elevated left atrial pressure.  2. Right ventricular systolic function is normal. The right ventricular   size is normal.  3. There is mild thickening of the mitral valve leaflets. There is mild  calcification of the mitral valve leaflet. Mild to moderate mitral annular  calcification most notably on the posterior annulus. Trivial mitral valve  regurgitation.  4. The aortic valve is tricuspid. There is mild calcification of the  aortic valve. There is mild thickening of the aortic valve. Aortic valve  regurgitation is trivial. Mild aortic valve sclerosis is present, with no  evidence of aortic valve stenosis.  5. The inferior vena cava is normal in size with greater than 50%  respiratory variability, suggesting right atrial pressure of 3 mmHg.  Nuclear Stress 11/11/2001: FINDINGS  CLINICAL DATA: CORONARY ARTERY DISEASE AND PERIPHERAL CORONARY STENT PLACEMENT. CHEST PAIN AND  HYPERTENSION.  MYOCARDIAL PERFUSION IMAGING WITH SPECT AT REST AND STRESS, GATED LEFT  VENTRICULAR WALL MOTION  STUDY, AND EJECTION FRACTION  THE PATIENT WAS INJECTED WITH 10 MILLICURIES OF 42AJG CARDIOLITE AT REST, AND MYOCARDIAL SPECT WAS  PERFORMED. EXERCISE TREADMILL TESTING WAS SUBSEQUENTLY PERFORMED UNDER THE SUPERVISION OF THE  CARDIOLOGY STAFF. THE PATIENT ACHIEVED TARGET HEART RATE, AND AT PEAK STRESS WAS INJECTED WITH 30  MILLICURIES OF 81LXB CARDIOLITE. GATED LEFT VENTRICULAR WALL MOTION STUDY WAS PERFORMED, WITH LEFT  VENTRICULAR EJECTION FRACTION.  THE SPECT IMAGES SHOW DECREASED ACTIVITY IN THE ANTERIOR WALL ON BOTH THE STRESS AND REST IMAGES,  WITHOUT EVIDENCE OF REVERSIBILITY. THIS IS MOST CONSISTENT WITH BREAST ATTENUATION ARTIFACT. NO  REVERSIBLE MYOCARDIAL PERFUSION DEFECTS ARE SEEN TO SUGGEST THE PRESENCE OF MYOCARDIAL ISCHEMIA.  THE LEFT VENTRICULAR WALL MOTION STUDY SHOWS NO EVIDENCE OF REGIONAL LEFT VENTRICULAR WALL MOTION  ABNORMALITY.  THE CALCULATED LEFT VENTRICULAR EJECTION FRACTION IS 53%.  IMPRESSION  1. NO EVIDENCE OF EXERCISE-INDUCED MYOCARDIAL ISCHEMIA.  2. CALCULATED LEFT  VENTRICULAR EJECTION FRACTION OF 53%.  EKG:  EKG 09/27/20 NSR with q waves V1-V2.  Recent Labs: 06/29/2020: BUN 7; Creatinine, Ser 0.83; Potassium 3.8; Sodium 134 09/27/2020: Hemoglobin 12.4; Platelets 252  Recent Lipid Panel    Component Value Date/Time   CHOL 157 06/29/2020 1523   TRIG 86 06/29/2020 1523   HDL 63 06/29/2020 1523   CHOLHDL 2.5 06/29/2020 1523   CHOLHDL 4.0 11/07/2015 1604   VLDL 49 (H) 11/07/2015 1604   LDLCALC 78 06/29/2020 1523      Physical Exam:    VS:  BP (!) 144/66   Pulse 61   Ht _0  (1.575 m)   Wt 143 lb 6.4 oz (65 kg)   SpO2 96%   BMI 26.23 kg/m     Wt Readings from Last 3 Encounters:  10/17/20 143 lb 6.4 oz (65 kg)  09/27/20 144 lb 3.2 oz (65.4 kg)  06/29/20 143 lb 9.6 oz (65.1 kg)     GEN:  Well nourished, well developed in no acute distress HEENT: Normal NECK: No JVD; No carotid bruits CARDIAC: RRR, 2/6 systolic murmur at RUSB, no rubs, gallops RESPIRATORY:  Clear to auscultation without rales, wheezing or rhonchi  ABDOMEN: Soft, non-tender, non-distended MUSCULOSKELETAL:  No edema; No deformity  SKIN: Warm and dry NEUROLOGIC:  Alert and oriented x 3 PSYCHIATRIC:  Normal affect   ASSESSMENT:    1. Coronary artery disease involving native coronary artery of native heart without angina pectoris   2. Essential hypertension   3. Medication management   4. HYPERCHOLESTEROLEMIA    PLAN:    In order of problems listed above:  #Coronary Artery Disease with Remote MI: Had chest pain after having the TTE as she states they "pressed hard on her chest." TTE with normal LVEF 60-65%, moderate LVH, mild-to-moderate MAC, mild sclerosis. No recurrent chest pain and no exertional symptoms currently. No SOB, LE edema, orthopnea, lightheadedness, or dizziness.  -Continue ASA 59m daily -Continue prava 457mdaily; consider changing to crestor 2073mn the future pending lipids -Losartan 100m6mily -Bisoprolol 5mg 54mly  #HTN: Elevated at home  and in the office today with LVH on TTE. -Losartan 100mg 43my -Bisoprolol 5mg da71m -HCTZ 25mg da69m-Amlodipine 10mg dai45mStart spiro 25mg dail49mMET next week -Low Na diet -Follow-up in HTN clinic  #HLD: LDL 78.  -Consider changing prava to crestor pending repeat lipids -Continue lifestyle modifications    Medication Adjustments/Labs and Tests Ordered: Current medicines are reviewed at length with the patient today.  Concerns regarding medicines are outlined above.  Orders Placed This Encounter  Procedures  . Basic metabolic panel  . AMB Referral to [facility]   Meds ordered this encounter  Medications  . spironolactone (ALDACTONE) 25 MG tablet    Sig: Take 1 tablet (25 mg total) by mouth daily.    Dispense:  90 tablet    Refill:  1    Patient Instructions  Medication Instructions:   START TAKING SPIRONOLACTONE 25 MG BY MOUTH DAILY  *If you need a refill on your cardiac medications before your next appointment, please call your pharmacy*   Lab Work:  [place] OFFICE--WE WILL CHECK BMET AT THAT TIME  If you have labs (blood work) drawn today and your tests are completely normal, you will receive your results only by: Marland Kitchen MyChart Message (if you have MyChart) OR . A paper copy in the mail If you have any lab test that is abnormal or we need to change your treatment, we will call you to review the results.   You have been referred to [place] OFFICE TO SEE OUR PHARMACIST IN THE NEXT SEVERAL WEEKS, OR FIRST AVAILABLE, FOR MEDICATION AND BP MANAGEMENT   Follow-Up: At [facility], you and your health needs are our priority.  As part of our continuing mission to provide you with exceptional heart care, we have created designated Provider Care Teams.  These Care Teams include your primary Cardiologist (physician) and Advanced Practice Providers (APPs -  Physician Assistants and Nurse Practitioners) who all work  together to provide you with the care you need, when you need it.  We recommend signing up for the patient portal called "MyChart".  Sign up information is provided on this After Visit Summary.  MyChart is used to connect with patients for Virtual Visits (Telemedicine).  Patients are able to view lab/test results, encounter notes, upcoming appointments, etc.  Non-urgent messages can be sent to your provider as well.   To learn more about what you can do with MyChart, go to [email or web address].    Your next appointment:   1 year(s)  The format for your next appointment:   In Person  Provider:   Gwyndolyn Kaufman, MD        Signed, Freada Bergeron, MD  10/17/2020 2:32 PM    [place]

## 2020-10-17 ENCOUNTER — Encounter: Payer: Self-pay | Admitting: Cardiology

## 2020-10-17 ENCOUNTER — Other Ambulatory Visit: Payer: Self-pay

## 2020-10-17 ENCOUNTER — Ambulatory Visit: Payer: Medicare Other | Admitting: Cardiology

## 2020-10-17 VITALS — BP 144/66 | HR 61 | Ht 62.0 in | Wt 143.4 lb

## 2020-10-17 DIAGNOSIS — I2510 Atherosclerotic heart disease of native coronary artery without angina pectoris: Secondary | ICD-10-CM

## 2020-10-17 DIAGNOSIS — Z79899 Other long term (current) drug therapy: Secondary | ICD-10-CM | POA: Diagnosis not present

## 2020-10-17 DIAGNOSIS — E7800 Pure hypercholesterolemia, unspecified: Secondary | ICD-10-CM

## 2020-10-17 DIAGNOSIS — I10 Essential (primary) hypertension: Secondary | ICD-10-CM | POA: Diagnosis not present

## 2020-10-17 MED ORDER — SPIRONOLACTONE 25 MG PO TABS
25.0000 mg | ORAL_TABLET | Freq: Every day | ORAL | 1 refills | Status: DC
Start: 2020-10-17 — End: 2021-02-17

## 2020-10-17 MED ORDER — CILOSTAZOL 100 MG PO TABS: ORAL_TABLET | ORAL | 1 refills | Status: DC

## 2020-10-17 NOTE — Patient Instructions (Signed)
Medication Instructions:   START TAKING SPIRONOLACTONE 25 MG BY MOUTH DAILY  *If you need a refill on your cardiac medications before your next appointment, please call your pharmacy*   Lab Work:  [place] OFFICE--WE [place]  If you have labs (blood work) drawn today and your tests are completely normal, you will receive your results only by: Marland Kitchen MyChart Message (if you have MyChart) OR . A paper copy in the mail If you have any lab test that is abnormal or we need to change your treatment, we will call you to review the results.   You have been referred to [place] OFFICE TO SEE OUR PHARMACIST IN THE NEXT SEVERAL WEEKS, OR FIRST AVAILABLE, FOR MEDICATION AND BP MANAGEMENT   Follow-Up: At [facility], you and your health needs are our priority.  As part of our continuing mission to provide you with exceptional heart care, we have created designated Provider Care Teams.  These Care Teams include your primary Cardiologist (physician) and Advanced Practice Providers (APPs -  Physician Assistants and Nurse Practitioners) who all work together to provide you with the care you need, when you need it.  We recommend signing up for the patient portal called "MyChart".  Sign up information is provided on this After Visit Summary.  MyChart is used to connect with patients for Virtual Visits (Telemedicine).  Patients are able to view lab/test results, encounter notes, upcoming appointments, etc.  Non-urgent messages can be sent to your provider as well.   To learn more about what you can do with MyChart, go to [email or web address].    Your next appointment:   1 year(s)  The format for your next appointment:   In Person  Provider:   Gwyndolyn Kaufman, MD

## 2020-10-24 ENCOUNTER — Other Ambulatory Visit: Payer: Self-pay

## 2020-10-24 DIAGNOSIS — I10 Essential (primary) hypertension: Secondary | ICD-10-CM

## 2020-10-25 MED ORDER — AMLODIPINE BESYLATE 10 MG PO TABS: ORAL_TABLET | ORAL | 1 refills | Status: DC

## 2020-10-28 ENCOUNTER — Other Ambulatory Visit: Payer: Self-pay

## 2020-10-28 ENCOUNTER — Other Ambulatory Visit: Payer: Medicare Other | Admitting: *Deleted

## 2020-10-28 DIAGNOSIS — I10 Essential (primary) hypertension: Secondary | ICD-10-CM

## 2020-10-28 DIAGNOSIS — Z79899 Other long term (current) drug therapy: Secondary | ICD-10-CM

## 2020-10-28 LAB — BASIC METABOLIC PANEL
BUN/Creatinine Ratio: 10 — ABNORMAL LOW (ref 12–28)
BUN: 8 mg/dL (ref 8–27)
CO2: 23 mmol/L (ref 20–29)
Calcium: 10.1 mg/dL (ref 8.7–10.3)
Chloride: 90 mmol/L — ABNORMAL LOW (ref 96–106)
Creatinine, Ser: 0.83 mg/dL (ref 0.57–1.00)
Glucose: 95 mg/dL (ref 65–99)
Potassium: 4.4 mmol/L (ref 3.5–5.2)
Sodium: 128 mmol/L — ABNORMAL LOW (ref 134–144)
eGFR: 74 mL/min/{1.73_m2} (ref >59)

## 2020-10-31 ENCOUNTER — Telehealth: Payer: Self-pay | Admitting: *Deleted

## 2020-10-31 DIAGNOSIS — E871 Hypo-osmolality and hyponatremia: Secondary | ICD-10-CM

## 2020-10-31 DIAGNOSIS — Z79899 Other long term (current) drug therapy: Secondary | ICD-10-CM

## 2020-10-31 DIAGNOSIS — I10 Essential (primary) hypertension: Secondary | ICD-10-CM

## 2020-10-31 MED ORDER — HYDROCHLOROTHIAZIDE 12.5 MG PO CAPS: 12.5000 mg | ORAL_CAPSULE | Freq: Every day | ORAL | 0 refills | Status: DC

## 2020-10-31 NOTE — Telephone Encounter (Signed)
Spoke with the pt and endorsed to her, her lab results and recommendations per Dr. Johney Frame, for her to decrease her HCTZ to 12.5 mg po daily, and come in for repeat BMET on 4/18, same day as she see's our Pharmacist in HTN clinic.  Scheduled the pt a lab appt for 4/18, to check BMET.  Confirmed the pharmacy of choice with the pt.  Pt states she will use her current supply on hand first and cut those in 1/2 and call the pharmacy for further refills.  Note placed to the pharmacy about this.  Pt verbalized understanding and agrees with this plan.

## 2020-10-31 NOTE — Telephone Encounter (Signed)
-----   Message from Freada Bergeron, MD sent at 10/29/2020 12:39 PM EDT ----- Completely agree with you. Let's cut her HCTZ down to 12.5mg  daily and she can follow-up with HTN clinic with plan for repeat BMET in 1 week.

## 2020-11-07 ENCOUNTER — Other Ambulatory Visit: Payer: Self-pay

## 2020-11-07 ENCOUNTER — Other Ambulatory Visit: Payer: Medicare Other

## 2020-11-07 ENCOUNTER — Ambulatory Visit (INDEPENDENT_AMBULATORY_CARE_PROVIDER_SITE_OTHER): Payer: Medicare Other | Admitting: Pharmacist

## 2020-11-07 VITALS — BP 128/62 | HR 66

## 2020-11-07 DIAGNOSIS — I10 Essential (primary) hypertension: Secondary | ICD-10-CM

## 2020-11-07 DIAGNOSIS — Z79899 Other long term (current) drug therapy: Secondary | ICD-10-CM | POA: Diagnosis not present

## 2020-11-07 DIAGNOSIS — E871 Hypo-osmolality and hyponatremia: Secondary | ICD-10-CM | POA: Diagnosis not present

## 2020-11-07 NOTE — Progress Notes (Addendum)
Patient ID: [name] Page                 DOB: [birth year]                      MRN: [id number]     HPI: [name] Page is a 75 y.o. female referred by Dr. Johney Frame to HTN clinic. PMH is significant for CAD s/p MI in 1997 s/p PCI x2, HTN, HLD, and COPD. She was seen by Dr Johney Frame on 10/17/20 for evaluation of chest pressure after TTE. BP was elevated at 144/66 and pt was started on spironolactone 25mg  daily. Follow up BMET on 4/8 showed Na drop from 134 to 128. HCTZ was decreased to 12.5mg  daily. Pt presents to HTN clinic for follow up.  Pt presents today in good spirits. Reports tolerating her medications well. Denies dizziness, balance issues, headache (unless she hasn't eaten anything in a while), and LEE. Takes all her meds in the AM except her statin which she takes at night. Uses Voltaren gel for pain in her knees and hips, otherwise takes Tylenol arthritis. Has some numbness in her left hand after her COVID booster in mid March, was advised she may have hit a nerve. Takes her BP meds ~7:30am and has checked BP anywhere between 10am and 2pm at various times using a bicep cuff she's had for about 3 years. Home readings have been well controlled - see below.  Current HTN meds:  Amlodipine 10mg  daily - AM Bisoprolol 5mg  daily - AM HCTZ 12.5mg  daily - AM Losartan 100mg  daily - AM Spironolactone 25mg  daily - AM  Previously tried: lisinopril - rash  BP goal: <130/69mmHg  Family History: Cancer in her mother; Clotting disorder in her brother; Colon cancer (age of onset: 22) in her mother; Diabetes in her sister; Esophageal cancer in her sister; Heart disease in her father. There is no history of Rectal cancer or Stomach cancer.  Social History: Smokes 1/4 PPD for 10 years, denies alcohol and illicit drug use.   Diet: Doesn't cook with salt, uses salt substitute. 1-2 cups of coffee each day.   Exercise: Walks around the block if the weather is nice. Otherwise, walks 5-10 minutes on the  treadmill.  Home BP readings:  141/75, 135/66, 131/63, 123/61, 128/61, 123/59, 127/64, 135/53, 125/60, 127/65; HR 57 - 63 - bicep cuff she's had for 3 years  Wt Readings from Last 3 Encounters:  10/17/20 143 lb 6.4 oz (65 kg)  09/27/20 144 lb 3.2 oz (65.4 kg)  06/29/20 143 lb 9.6 oz (65.1 kg)   BP Readings from Last 3 Encounters:  10/17/20 (!) 144/66  09/27/20 122/72  06/29/20 127/80   Pulse Readings from Last 3 Encounters:  10/17/20 61  09/27/20 77  06/29/20 78    Renal function: CrCl cannot be calculated (Unknown ideal weight.).  Past Medical History:  Diagnosis Date  . Allergic rhinitis 09/19/2006   Qualifier: Diagnosis of  By: Henderson Baltimore MD, Janett Billow    . Allergy   . Arthritis   . [place] tarry stools    from iron pills  . CHF (congestive heart failure) ([place]) 1997  . Colon polyps 2005  . COPD (chronic obstructive pulmonary disease) ([place])   . GERD (gastroesophageal reflux disease)   . Heart attack ([place]) 1997 and 2001  . Hyperlipidemia   . Hypertension   . Otomycosis of both ears 12/28/2019  . Pneumonia 2012  . Status post dilation of esophageal narrowing  Current Outpatient Medications on File Prior to Visit  Medication Sig Dispense Refill  . amLODipine (NORVASC) 10 MG tablet TAKE 1 TABLET(10 MG) BY MOUTH DAILY 90 tablet 1  . aspirin EC 81 MG tablet Take 81 mg by mouth daily.    . bisoprolol (ZEBETA) 5 MG tablet TAKE 1 TABLET(5 MG) BY MOUTH DAILY 90 tablet 1  . cetirizine (ZYRTEC) 10 MG tablet TAKE 1 TABLET(10 MG) BY MOUTH DAILY 90 tablet 3  . cilostazol (PLETAL) 100 MG tablet TAKE 1 TABLET(100 MG) BY MOUTH TWICE DAILY 180 tablet 1  . diclofenac Sodium (VOLTAREN) 1 % GEL Apply 4 g topically 4 (four) times daily as needed. 150 g 3  . famotidine (PEPCID) 20 MG tablet TAKE 1 TABLET(20 MG) BY MOUTH TWICE DAILY 60 tablet 3  . hydrochlorothiazide (MICROZIDE) 12.5 MG capsule Take 1 capsule (12.5 mg total) by mouth daily. 90 capsule 0  . hydrocortisone 2.5 % ointment Apply  topically 2 (two) times daily. As needed for outer portion of ear canal.  Do not use for more than 1-2 weeks at a time. 30 g 3  . losartan (COZAAR) 100 MG tablet TAKE 1 TABLET(100 MG) BY MOUTH DAILY 90 tablet 3  . omeprazole (PRILOSEC) 40 MG capsule Take 1 capsule (40 mg total) by mouth as needed. 30 capsule 2  . pravastatin (PRAVACHOL) 40 MG tablet Take 1 tablet (40 mg total) by mouth daily. 90 tablet 3  . spironolactone (ALDACTONE) 25 MG tablet Take 1 tablet (25 mg total) by mouth daily. 90 tablet 1   No current facility-administered medications on file prior to visit.    Allergies  Allergen Reactions  . Lisinopril Dermatitis and Rash  . Penicillins Anaphylaxis and Swelling     Assessment/Plan:  1. Hypertension - Clinic BP and home BP readings are well controlled at goal <130/82mmHg. Rechecking BMET today since HCTZ dose was decreased to 12.5mg  daily 10 days ago in setting of hyponatremia. Will continue current BP medications including amlodipine 10mg  daily, HCTZ 12.5mg  daily, losartan 100mg  daily, bisoprolol 5mg  daily, and spironolactone 25mg  daily. Encouraged pt to stay active with walking and continue limiting sodium/caffeine in her diet. F/u in HTN clinic as needed.  Meggan Dhaliwal E. Elroy Schembri, PharmD, BCACP, [facility] [address], [place], [place] [address] Phone: [phone]; Fax: [phone] 11/07/2020 3:07 PM

## 2020-11-07 NOTE — Patient Instructions (Addendum)
It was nice to meet you today!  Your blood pressure goal is < 130/17mmHg  Continue taking your current blood pressure medications: Amlodipine 10mg  daily Bisoprolol 5mg  daily HCTZ 12.5mg  daily Losartan 100mg  daily Spironolactone 25mg  daily  Continue cutting your hydrochlorothiazide 25mg  tablets in 1/2 and take 1/2 tablet each day. When you run out, the pharmacy should refill a 12.5mg  capsule for you and you can take 1 capsule daily.  Continue to monitor your blood pressure at home and call with concerns

## 2020-11-08 LAB — BASIC METABOLIC PANEL
BUN/Creatinine Ratio: 9 — ABNORMAL LOW (ref 12–28)
BUN: 8 mg/dL (ref 8–27)
CO2: 24 mmol/L (ref 20–29)
Calcium: 10.3 mg/dL (ref 8.7–10.3)
Chloride: 93 mmol/L — ABNORMAL LOW (ref 96–106)
Creatinine, Ser: 0.86 mg/dL (ref 0.57–1.00)
Glucose: 80 mg/dL (ref 65–99)
Potassium: 4.4 mmol/L (ref 3.5–5.2)
Sodium: 132 mmol/L — ABNORMAL LOW (ref 134–144)
eGFR: 71 mL/min/{1.73_m2} (ref >59)

## 2020-11-11 ENCOUNTER — Telehealth: Payer: Self-pay | Admitting: *Deleted

## 2020-11-11 NOTE — Telephone Encounter (Signed)
-----   Message from Freada Bergeron, MD sent at 11/10/2020  8:48 PM EDT ----- Her sodium is better but still a little low. Let's stop the HCTZ and if her blood pressure is above goal (>120/80s), we can increase her bisoprolol or her spironolactone.

## 2020-11-11 NOTE — Telephone Encounter (Signed)
Pt made aware that per Dr. Johney Frame, her labs showed that her sodium is better, but still low, so she recommends that we stop her HCTZ, and she should continue monitoring her BP at home. Informed the pt that Dr. Johney Frame advised that  if she notices her BP is above goal >120/80s, then she should call our office to inform us of this, so we can either advise on increasing her spironolactone or bisoprolol. Pt verbalized understanding and agrees with this plan. Pt states she will continue monitoring her BP at home, and report to [place] as needed.

## 2020-11-23 ENCOUNTER — Ambulatory Visit (INDEPENDENT_AMBULATORY_CARE_PROVIDER_SITE_OTHER): Payer: Medicare Other | Admitting: Family Medicine

## 2020-11-23 ENCOUNTER — Encounter: Payer: Self-pay | Admitting: Family Medicine

## 2020-11-23 ENCOUNTER — Other Ambulatory Visit: Payer: Self-pay

## 2020-11-23 VITALS — BP 131/65 | HR 64 | Ht 62.0 in | Wt 143.4 lb

## 2020-11-23 DIAGNOSIS — I10 Essential (primary) hypertension: Secondary | ICD-10-CM

## 2020-11-23 DIAGNOSIS — F4320 Adjustment disorder, unspecified: Secondary | ICD-10-CM | POA: Insufficient documentation

## 2020-11-23 DIAGNOSIS — E871 Hypo-osmolality and hyponatremia: Secondary | ICD-10-CM

## 2020-11-23 DIAGNOSIS — F4321 Adjustment disorder with depressed mood: Secondary | ICD-10-CM | POA: Diagnosis not present

## 2020-11-23 NOTE — Patient Instructions (Signed)
Keep holding off from the HCTZ. You can continue the rest of the medications as is.   Keep checking your BP every few days and with your journal. We will check your sodium level today, I will call or letter with results.

## 2020-11-23 NOTE — Assessment & Plan Note (Signed)
NA [id number] when most recently checked in 10/2020, no previous history of hyponatremia.  Asymptomatic.  May have been in the setting of HCTZ which has been discontinued for approximately 2 weeks.  Recheck BMP today.

## 2020-11-23 NOTE — Progress Notes (Signed)
    SUBJECTIVE:   CHIEF COMPLAINT / HPI: F/u BP  [name] Page is a 75 year old female presenting for hypertension follow-up and to discuss the following:  Hypertension: She recently was seen by cardiology on 3/28 and they added on spironolactone 25 mg at that time.  She reports that she has been tolerating this medication well.  They subsequently discontinued HCTZ a few weeks later as discussed below.  Since discontinuing that her BP has been ranging 629-528 systolic and 41-32 diastolic.  Average the last few days has been SBP 130s.  She follows a well-balanced diet with low sodium.  Trying to be active when she can.   Hyponatremia: She had a BMP obtained on 4/8 after spironolactone start, her sodium incidentally was noted to be 128 at that time.  Potassium 4.4.  She had not had a previous history of hyponatremia.  They have to her HCTZ and rechecked on 4/18.  Sodium improved to 132, however not back to baseline around 134/135.  Discontinued HCTZ entirely, she has not taken it since 4/19.  Denies any symptoms including any muscle weakness/spasm/cramping, increased fatigue, headache, N/V.  Grief reaction: Reports that her stepmother passed away a few weeks ago.  She was 84.  States that her younger brother and sister (this was their mother) have taken it particularly difficult.  States she is doing overall well, however mourns for her family.  Has had less appetite due to the stress over the past few weeks but feels like this is getting better.   [place] Office Visit from 11/23/2020 in [place] Office Visit from 09/27/2020 in [place] Office Visit from 09/21/2016 in [place]  Thoughts that you would be better off dead, or of hurting yourself in some way Not at all Not at all Not at all  PHQ-9 Total Score 4 1 0      PERTINENT  PMH / PSH: Hypertension, COPD,osteopenia, CAD/distant MI, current tobacco use,  hyperlipidemia,polyarthralgias and hair loss  OBJECTIVE:   BP 131/65   Pulse 64   Ht 5\' 2"  (1.575 m)   Wt 143 lb 6.4 oz (65 kg)   SpO2 97%   BMI 26.23 kg/m   General: Alert, NAD HEENT: NCAT, MMM Cardiac: RRR  Lungs: Clear bilaterally, no increased WOB  Ext: Warm, dry, 2+ distal pulses, no edema   ASSESSMENT/PLAN:   Essential hypertension Overall reasonable control without HCTZ.  Continue bisoprolol, Norvasc, spironolactone, and losartan as is.  Continue monitoring BP periodically at home and bring in journal on follow-up visit.  Checking BMP as discussed below.  Hyponatremia NA 128>132 when most recently checked in 10/2020, no previous history of hyponatremia.  Asymptomatic.  May have been in the setting of HCTZ which has been discontinued for approximately 2 weeks.  Recheck BMP today.  Grief reaction Spent majority of visit providing supportive listening after hearing the loss of her stepmother.  Fortunately reports that she is handling this well, just overwhelmed providing support to her family.  Does not feel that she needs any therapy or additional assistance at this time.  Will be available when she needs.    Follow-up pending lab results, otherwise follow-up in 3 months or sooner if needed.  Patriciaann Clan, [place]

## 2020-11-23 NOTE — Assessment & Plan Note (Signed)
Spent majority of visit providing supportive listening after hearing the loss of her stepmother.  Fortunately reports that she is handling this well, just overwhelmed providing support to her family.  Does not feel that she needs any therapy or additional assistance at this time.  Will be available when she needs.

## 2020-11-23 NOTE — Assessment & Plan Note (Signed)
Overall reasonable control without HCTZ.  Continue bisoprolol, Norvasc, spironolactone, and losartan as is.  Continue monitoring BP periodically at home and bring in journal on follow-up visit.  Checking BMP as discussed below.

## 2020-11-24 LAB — BASIC METABOLIC PANEL
BUN/Creatinine Ratio: 6 — ABNORMAL LOW (ref 12–28)
BUN: 5 mg/dL — ABNORMAL LOW (ref 8–27)
CO2: 19 mmol/L — ABNORMAL LOW (ref 20–29)
Calcium: 9.7 mg/dL (ref 8.7–10.3)
Chloride: 103 mmol/L (ref 96–106)
Creatinine, Ser: 0.8 mg/dL (ref 0.57–1.00)
Glucose: 102 mg/dL — ABNORMAL HIGH (ref 65–99)
Potassium: 4.3 mmol/L (ref 3.5–5.2)
Sodium: 141 mmol/L (ref 134–144)
eGFR: 77 mL/min/{1.73_m2} (ref >59)

## 2020-11-25 ENCOUNTER — Encounter: Payer: Self-pay | Admitting: Family Medicine

## 2021-01-09 ENCOUNTER — Other Ambulatory Visit: Payer: Self-pay | Admitting: Family Medicine

## 2021-02-15 ENCOUNTER — Encounter: Payer: Self-pay | Admitting: Family Medicine

## 2021-02-15 ENCOUNTER — Ambulatory Visit (INDEPENDENT_AMBULATORY_CARE_PROVIDER_SITE_OTHER): Payer: Medicare Other | Admitting: Family Medicine

## 2021-02-15 ENCOUNTER — Other Ambulatory Visit: Payer: Self-pay

## 2021-02-15 VITALS — BP 152/64 | HR 66 | Ht 62.0 in | Wt 145.0 lb

## 2021-02-15 DIAGNOSIS — R21 Rash and other nonspecific skin eruption: Secondary | ICD-10-CM | POA: Diagnosis not present

## 2021-02-15 DIAGNOSIS — I10 Essential (primary) hypertension: Secondary | ICD-10-CM

## 2021-02-15 NOTE — Progress Notes (Signed)
   SUBJECTIVE:   CHIEF COMPLAINT / HPI:   Chief Complaint  Patient presents with   Medication Problem   Blister    Pt stated she have them on her hands and back     [name] Page is a 75 y.o. female here to discuss possible medication reaction. States hairloss and blistery-type rash has gotten worse since starting Aldactone. Has itchy dry skin on arms and legs. Also has itchy patch on her back. Symptoms have been getting worse in the past month or so. Has had a scalp lesion for some time now.  Denies fever, new arthralgia, drainage from lesions. Has tried "skin cream" for extremities without relief. Was treated previously by Dr. Higinio Plan for "fungal infection"  on her scalp.      PERTINENT  PMH / PSH: reviewed and updated as appropriate   OBJECTIVE:   BP (!) 152/64   Pulse 66   Ht '5\' 2"'$  (1.575 m)   Wt 145 lb (65.8 kg)   SpO2 95%   BMI 26.52 kg/m    GEN: well appearing female in no acute distress  CVS: well perfused  RESP: speaking in full sentences without pause, no respiratory distress  SKIN: warm, dry            ASSESSMENT/PLAN:   Rash Pt is a 75 yo female reports hx of RA and states sister has some type of Lupus with worsening facial and dry skin extremity rash. See images above. Scalp and facial rash concerning for discoid Lupus. Drug reaction as pt reports worsening since starting Aldactone.  Advised to hold Aldactone. Atypical eczema, dry skin and fungal rash possible. Given family hx is reasonable to obtain a autoimmune panel. Biopsy discussed with patient and she is agreeable. Pt to return for biopsy.   Essential hypertension Stop Aldactone. Restart low dose HCTZ 12.5 mg. Continue bisoprolol, Norvasc and Losartan. Advised to keep a BP log. To to follow up for skin biopsy within the next few weeks. Titrate anti-hypertensive medications as needed.       Lyndee Hensen, DO PGY-3, [facility] 02/15/2021

## 2021-02-15 NOTE — Patient Instructions (Signed)
It was great seeing you today!   I'd like to see you back in about 2 weeks  but if you need to be seen earlier than that for any new issues we're happy to fit you in, just give [place] a call!  As discussed:   For your rash - we will perform a biopsy at your next visit   For your blood pressure:  - Stop taking Aldactone  - Start taking 12.5 mg of HCTZ.  Bring you bottle with you at your next visit.    If you have questions or concerns please do not hesitate to call at [phone].  Dr. Rushie [facility]

## 2021-02-16 LAB — AUTOIMMUNE PROFILE
Anti Nuclear Antibody (ANA): NEGATIVE
Complement C3, Serum: 134 mg/dL (ref 82–167)
dsDNA Ab: <1 IU/mL (ref 0–9)

## 2021-02-17 ENCOUNTER — Other Ambulatory Visit: Payer: Self-pay | Admitting: Family Medicine

## 2021-02-17 DIAGNOSIS — K219 Gastro-esophageal reflux disease without esophagitis: Secondary | ICD-10-CM

## 2021-02-17 DIAGNOSIS — R21 Rash and other nonspecific skin eruption: Secondary | ICD-10-CM | POA: Insufficient documentation

## 2021-02-17 DIAGNOSIS — I10 Essential (primary) hypertension: Secondary | ICD-10-CM

## 2021-02-17 NOTE — Assessment & Plan Note (Signed)
Pt is a 75 yo female reports hx of RA and states sister has some type of Lupus with worsening facial and dry skin extremity rash. See images above. Scalp and facial rash concerning for discoid Lupus. Drug reaction as pt reports worsening since starting Aldactone.  Advised to hold Aldactone. Atypical eczema, dry skin and fungal rash possible. Given family hx is reasonable to obtain a autoimmune panel. Biopsy discussed with patient and she is agreeable. Pt to return for biopsy.

## 2021-02-17 NOTE — Assessment & Plan Note (Signed)
Stop Aldactone. Restart low dose HCTZ 12.5 mg. Continue bisoprolol, Norvasc and Losartan. Advised to keep a BP log. To to follow up for skin biopsy within the next few weeks. Titrate anti-hypertensive medications as needed.

## 2021-02-28 ENCOUNTER — Telehealth: Payer: Self-pay

## 2021-02-28 NOTE — Telephone Encounter (Signed)
Tried to contact pt for [facility] appt.   No answer at number listed for patient.

## 2021-03-13 ENCOUNTER — Other Ambulatory Visit: Payer: Self-pay

## 2021-03-13 ENCOUNTER — Ambulatory Visit (INDEPENDENT_AMBULATORY_CARE_PROVIDER_SITE_OTHER): Payer: Medicare Other | Admitting: Family Medicine

## 2021-03-13 ENCOUNTER — Encounter: Payer: Self-pay | Admitting: Family Medicine

## 2021-03-13 DIAGNOSIS — R21 Rash and other nonspecific skin eruption: Secondary | ICD-10-CM

## 2021-03-13 DIAGNOSIS — L659 Nonscarring hair loss, unspecified: Secondary | ICD-10-CM | POA: Diagnosis not present

## 2021-03-13 NOTE — Patient Instructions (Addendum)
Thank you for coming into the office today.    As discussed, you are scheduled for dermatology appointment on 04/13/21. You will likely have a biopsy.  Continue to moisture your scalp. Stay hydrated.   Take Care,   Dr. Susa Simmonds

## 2021-03-13 NOTE — Assessment & Plan Note (Signed)
Worsening per pt. Does not want to see outside dermatologist at this time. Scheduled for dermatology clinic. There is likely a scarring effect to her hair loss.

## 2021-03-13 NOTE — Progress Notes (Signed)
   SUBJECTIVE:   CHIEF COMPLAINT / HPI:   Chief Complaint  Patient presents with   Rash     [name] Page is a 75 y.o. female here for itchy rash follow up. She has used antifungals in the past without relief. She complains of significant hair loss. States her scalp itches and it feels like "bugs crawling". She denies itching anywhere else. No known bugs siting or bug bites. No one else has similar sx. She has hx of RA and her sister has Lupus.  She thought this was related to Aldactone previously but has since stopped taking this medication.   PERTINENT  PMH / PSH: reviewed and updated as appropriate   OBJECTIVE:   BP (!) 124/56   Pulse 82   Ht '5\' 2"'$  (1.575 m)   Wt 142 lb 4 oz (64.5 kg)   SpO2 98%   BMI 26.02 kg/m    GEN: well appearing female in no acute distress  CVS: well perfused  RESP: speaking in full sentences without pause, no respiratory distress  SKIN: some areas appear to be scarring,   ASSESSMENT/PLAN:   Hair loss Worsening per pt. Does not want to see outside dermatologist at this time. Scheduled for dermatology clinic. There is likely a scarring effect to her hair loss.   Rash Pt is a 75 yo female with personal and family hx of autoimmune disorder. Her recent dsDNA was negative. ANA positive and C3 within normal limits. DDx not limited to atypical eczema, fungal or autoimmune hair loss lesion. She reports being previously told she has psoriasis. She stopped taking aldactone without improvement. Doubt fungal given hx of multiple antifungals being used. Drug induced not likely her cause. Punch biopsy not attempted due to increased bleeding risk due to location. Discussed biopsy and scheduled for dermatology clinic. Pt agrees with plan.      Lyndee Hensen, DO PGY-3, [facility] 03/13/2021

## 2021-03-13 NOTE — Assessment & Plan Note (Addendum)
Pt is a 75 yo female with personal and family hx of autoimmune disorder. Her recent dsDNA was negative. ANA positive and C3 within normal limits. DDx not limited to atypical eczema, fungal or autoimmune hair loss lesion. She reports being previously told she has psoriasis. She stopped taking aldactone without improvement. Doubt fungal given hx of multiple antifungals being used. Drug induced not likely her cause. Punch biopsy not attempted due to increased bleeding risk due to location. Discussed biopsy and scheduled for dermatology clinic. Pt agrees with plan.

## 2021-03-14 ENCOUNTER — Ambulatory Visit (INDEPENDENT_AMBULATORY_CARE_PROVIDER_SITE_OTHER): Payer: Medicare Other

## 2021-03-14 DIAGNOSIS — Z0000 Encounter for general adult medical examination without abnormal findings: Secondary | ICD-10-CM | POA: Diagnosis not present

## 2021-03-14 NOTE — Patient Instructions (Signed)
  [name] Page , Thank you for taking time to come for your Medicare Wellness Visit. I appreciate your ongoing commitment to your health goals. Please review the following plan we discussed and let me know if I can assist you in the future.   These are the goals we discussed:  Goals      Blood Pressure < 140/90     Quit smoking / using tobacco     Patient has been smoking more due to the stress of COVID and a recent move.         This is a list of the screening recommended for you and due dates:  Health Maintenance  Topic Date Due   Zoster (Shingles) Vaccine (1 of 2) Never done   COVID-19 Vaccine (4 - Booster for Pfizer series) 10/03/2020   Flu Shot  02/20/2021   Colon Cancer Screening  04/17/2023   Tetanus Vaccine  12/29/2029   DEXA scan (bone density measurement)  Completed   Hepatitis C Screening: USPSTF Recommendation to screen - Ages 11-79 yo.  Completed   Pneumonia vaccines  Completed   HPV Vaccine  Aged Out

## 2021-03-14 NOTE — Progress Notes (Signed)
Subjective:   [name] Page is a 75 y.o. female who presents for Medicare Annual (Subsequent) preventive examination.  I connected with  [name] Page on 03/14/21 by an audio only telemedicine application and verified that I am speaking with the correct person using two identifiers.   I discussed the limitations, risks, security and privacy concerns of performing an evaluation and management service by telephone and the availability of in person appointments. I also discussed with the patient that there may be a patient responsible charge related to this service. The patient expressed understanding and verbally consented to this telephonic visit.  Location of Patient: Home Location of Provider: Office  List any persons and their role that are participating in the visit with the patient: none  Review of Systems     Defer to PCP  Cardiac Risk Factors include: advanced age (>67mn, >>22women);hypertension;smoking/ tobacco exposure     Objective:    Today's Vitals   03/14/21 1841  PainSc: 2    There is no height or weight on file to calculate BMI.  Advanced Directives 03/14/2021 03/13/2021 02/15/2021 09/27/2020 03/27/2019 02/02/2019 07/10/2018  Does Patient Have a Medical Advance Directive? No No No No No No No  Type of Advance Directive - - - - - - -  Does patient want to make changes to medical advance directive? - - - - - - -  Copy of [place] Chart? - - - - - - -  Would patient like information on creating a medical advance directive? No - Patient declined No - Patient declined No - Patient declined No - Patient declined No - Patient declined Yes (MAU/Ambulatory/Procedural Areas - Information given) No - Patient declined    Current Medications (verified) Outpatient Encounter Medications as of 03/14/2021  Medication Sig   amLODipine (NORVASC) 10 MG tablet TAKE 1 TABLET(10 MG) BY MOUTH DAILY   aspirin EC 81 MG tablet Take 81 mg by mouth daily.    bisoprolol (ZEBETA) 5 MG tablet TAKE 1 TABLET(5 MG) BY MOUTH DAILY   cetirizine (ZYRTEC) 10 MG tablet TAKE 1 TABLET(10 MG) BY MOUTH DAILY   cilostazol (PLETAL) 100 MG tablet TAKE 1 TABLET(100 MG) BY MOUTH TWICE DAILY   diclofenac Sodium (VOLTAREN) 1 % GEL Apply 4 g topically 4 (four) times daily as needed.   famotidine (PEPCID) 20 MG tablet TAKE 1 TABLET(20 MG) BY MOUTH TWICE DAILY   hydrochlorothiazide (HYDRODIURIL) 25 MG tablet Take 12.5 mg by mouth daily.   hydrocortisone 2.5 % ointment Apply topically 2 (two) times daily. As needed for outer portion of ear canal.  Do not use for more than 1-2 weeks at a time.   losartan (COZAAR) 100 MG tablet TAKE 1 TABLET(100 MG) BY MOUTH DAILY   pravastatin (PRAVACHOL) 40 MG tablet Take 1 tablet (40 mg total) by mouth daily.   No facility-administered encounter medications on file as of 03/14/2021.    Allergies (verified) Lisinopril and Penicillins   History: Past Medical History:  Diagnosis Date   Allergic rhinitis 09/19/2006   Qualifier: Diagnosis of  By: HHenderson [place]MD, Jessica     Allergy    Arthritis    Rodrigues tarry stools    from iron pills   CH[place]estive heart failure) [place]   Colon polyps 2005   COPD (chronic obstructive pulmonary disease) (HCC)    GERD (gastroesophageal reflux disease)    Heart attack ([facility] 1997 and 2001   Hyperlipidemia    Hypertension  Otomycosis of both ears 12/28/2019   Pneumonia 2012   Status post dilation of esophageal narrowing    TMJ dysfunction 03/27/2019   Past Surgical History:  Procedure Laterality Date   CESAREAN SECTION  01/1976   COLONOSCOPY     CORONARY ANGIOPLASTY WITH STENT PLACEMENT  2002   POLYPECTOMY     Family History  Problem Relation Age of Onset   Colon cancer Mother 63       EARLY 52'S   Cancer Mother        colon   Heart disease Father    Diabetes Sister    Esophageal cancer Sister    Clotting disorder Brother    Rectal cancer Neg Hx    Stomach cancer Neg Hx    Social  History   Socioeconomic History   Marital status: Legally Separated    Spouse name: [name] Page   Number of children: 2   Years of education: 14   Highest education level: Some college, no degree  Occupational History   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    Patient has close relationships with her daughter, [name] Page, and surrounding family in the area.    Patient enjoys cooking and writing poetry. 8.23.22 - pt does cook as much. Pt enjoys word searches and reading.     Patient is separated from her husband, however they maintain a close friendship.    Social Determinants of Health   Financial Resource Strain: Low Risk    Difficulty of Paying Living Expenses: Not hard at all  Food Insecurity: No Food Insecurity   Worried About Charity fundraiser in the Last Year: Never true   [place] in the Last Year: Never true  Transportation Needs: No Transportation Needs   Lack of Transportation (Medical): No   Lack of Transportation (Non-Medical): No  Physical Activity: Sufficiently Active   Days of Exercise per Week: 4 days   Minutes of Exercise per Session: 40 min  Stress: No Stress Concern Present   Feeling of Stress : Not at all  Social Connections: Moderately Isolated   Frequency of Communication with Friends and Family: More than three times a week   Frequency of Social Gatherings with Friends and Family: Twice a week   Attends Religious Services: 1 to 4 times per year   Active Member of [employer] or Organizations: No   Attends Programme researcher, broadcasting/film/video: Never   Marital Status: Separated    Tobacco Counseling Ready to quit: No Counseling given: Yes Tobacco comments: working on quitting   Clinical Intake:  Pre-visit preparation completed: Yes  Pain : 0-10 Pain Score: 2  Pain Type: Acute pain Pain Location: Hand Pain Orientation: Right, Left Pain Radiating Towards: Pain radiates to wrists. Pain Descriptors / Indicators: Aching Pain Onset: More than a month ago Pain Frequency: Intermittent Pain Relieving Factors: Arthitis Tylenol and movement helps. Effect of Pain on Daily Activities:  It does not affect ADL's.  Pain Relieving Factors: Arthitis Tylenol and movement helps.  Nutritional Risks: None Diabetes: No  How often do you need to have someone help you when you read instructions, pamphlets, or other written materials from your doctor or pharmacy?: 1 - Never What is the last grade level you completed in school?: Some College  Diabetic? No  Interpreter Needed?: No  Information entered by :: [employer], CMA   Activities of Daily Living In your present state of health, do you have any difficulty performing the following activities: 03/14/2021  Hearing? N  Vision? N  Difficulty concentrating or making decisions? N  Walking or climbing stairs? N  Dressing or bathing? N  Doing errands, shopping? N  Preparing Food and eating ? N  Using the Toilet? N  In the past six months, have you accidently leaked urine? N  Do you have problems with loss of bowel control? N  Managing your Medications? N  Managing your Finances? N  Housekeeping or managing your Housekeeping? N  Some recent data might be hidden    Patient Care Team: Eulis Foster, MD as PCP - General (Family Medicine) Rex Kras, Jeanella Craze, MD as Attending Physician (Cardiology)  Indicate any recent Medical Services you may have received from other than [facility] providers in the past year (date may be  approximate).     Assessment:   This is a routine wellness examination for [name] Page.  Hearing/Vision screen No results found.  Dietary issues and exercise activities discussed: Current Exercise Habits: Home exercise routine, Type of exercise: exercise ball;walking;stretching, Time (Minutes): 30, Frequency (Times/Week): 4, Weekly Exercise (Minutes/Week): 120, Intensity: Moderate   Goals Addressed             This Visit's Progress    Blood Pressure < 140/90        Depression Screen PHQ 2/9 Scores 03/14/2021 03/13/2021 02/15/2021 11/23/2020 09/27/2020 12/28/2019 08/10/2019  PHQ - 2 Score 0 0 1 0 0 0 0  PHQ- 9 Score '2 2 5 4 1 '$ - -    Fall Risk Fall Risk  03/14/2021 03/13/2021 02/15/2021 09/27/2020 12/28/2019  Falls in the past year? 0 0 0 0 0  Number falls in past yr: 0 0 0 0 0  Injury with Fall? 0 0 0 0 -  Risk for fall due to : Orthopedic patient - - - -  Follow up Falls evaluation completed - - - Falls evaluation completed    FALL RISK PREVENTION PERTAINING TO THE HOME:  Any stairs in or around the home? No  If so, are there any without handrails?  N/A Home free of loose throw rugs in walkways, pet beds, electrical cords, etc? No  Adequate lighting in your home to reduce risk of falls? Yes   ASSISTIVE DEVICES UTILIZED TO PREVENT FALLS:  Life alert? No  Use of a cane, walker or w/c? No  Grab bars in the bathroom? No  Shower chair or bench in shower? No  Elevated toilet seat or a handicapped toilet? No   TIMED UP AND GO:  Was the test performed?  N/A .  Length of time to ambulate 10 feet: N/A sec.   Per patient statement: Gait steady and fast with assistive device  Cognitive Function:     6CIT Screen 03/14/2021 02/02/2019  What Year? 0 points 0 points  What month? 0 points 0 points  What time? 0 points 0 points  Count back from 20 0 points 0 points  Months  in reverse 0 points 0 points  Repeat phrase 0 points 0 points  Total Score 0 0    Immunizations Immunization  History  Administered Date(s) Administered   Fluad Quad(high Dose 65+) 06/29/2020   Influenza Split 05/23/2011   Influenza,inj,Quad PF,6+ Mos 05/18/2013, 05/17/2014, 05/12/2015, 07/26/2016, 04/17/2017, 05/06/2018, 03/27/2019   PFIZER(Purple Top)SARS-COV-2 Vaccination 09/19/2019, 10/17/2019, 07/05/2020   Pneumococcal Conjugate-13 12/08/2014   Pneumococcal Polysaccharide-23 05/23/2011   Td 11/20/2005   Tdap 12/30/2019    TDAP status: Up to date  Flu Vaccine status: Due, Education has been provided regarding the importance of this vaccine. Advised may receive this vaccine at local pharmacy or Health Dept. Aware to provide a copy of the vaccination record if obtained from local pharmacy or Health Dept. Verbalized acceptance and understanding.  Pneumococcal vaccine status: Up to date  Covid-19 vaccine status: Information provided on how to obtain vaccines.   Qualifies for Shingles Vaccine? Yes   Zostavax completed No   Shingrix Completed?: No.    Education has been provided regarding the importance of this vaccine. Patient has been advised to call insurance company to determine out of pocket expense if they have not yet received this vaccine. Advised may also receive vaccine at local pharmacy or Health Dept. Verbalized acceptance and understanding.  Screening Tests Health Maintenance  Topic Date Due   Zoster Vaccines- Shingrix (1 of 2) Never done   INFLUENZA VACCINE  02/20/2021   COVID-19 Vaccine (4 - Booster for Green series) 03/30/2021 (Originally 10/03/2020)   COLONOSCOPY (Pts 45-64yr Insurance coverage will need to be confirmed)  04/17/2023   TETANUS/TDAP  12/29/2029   DEXA SCAN  Completed   Hepatitis C Screening  Completed   PNA vac Low Risk Adult  Completed   HPV VACCINES  Aged Out    Health Maintenance  Health Maintenance Due  Topic Date Due   Zoster Vaccines- Shingrix (1 of 2) Never done   INFLUENZA VACCINE  02/20/2021    Colorectal cancer screening: Type of  screening: Colonoscopy. Completed 04/16/2018. Repeat every 5 years  Mammogram status: No longer required due to AGE.  Bone Density status: Completed 10/20/2014. Results reflect: Bone density results: OSTEOPENIA. Repeat every 2 years.  Lung Cancer Screening: (Low Dose CT Chest recommended if Age 75-80years, 30 pack-year currently smoking OR have quit w/in 15years.) does qualify.   Lung Cancer Screening Referral: No  Additional Screening:  Hepatitis C Screening: does qualify; Completed 04/17/2017  Vision Screening: Recommended annual ophthalmology exams for early detection of glaucoma and other disorders of the eye. Is the patient up to date with their annual eye exam?  No  Who is the provider or what is the name of the office in which the patient attends annual eye exams? Patient has been since before she retired.  If pt is not established with a provider, would they like to be referred to a provider to establish care? No .   Dental Screening: Recommended annual dental exams for proper oral hygiene  Community Resource Referral / Chronic Care Management: CRR required this visit?  No   CCM required this visit?  No     Plan:     I have personally reviewed and noted the following in the patient's chart:   Medical and social history Use of alcohol, tobacco or illicit drugs  Current medications and supplements including opioid prescriptions.  Functional ability and status Nutritional status Physical activity Advanced directives List of other physicians Hospitalizations, surgeries, and ER visits in previous 12 months  Vitals Screenings to include cognitive, depression, and falls Referrals and appointments  In addition, I have reviewed and discussed with patient certain preventive protocols, quality metrics, and best practice recommendations. A written personalized care plan for preventive services as well as general preventive health recommendations were provided to patient.      Tawni Pummel, [place]   03/14/2021   Nurse Notes:    Ms. Reyburn , Thank you for taking time to come for your Medicare Wellness Visit. I appreciate your ongoing commitment to your health goals. Please review the following plan we discussed and let me know if I can assist you in the future.   These are the goals we discussed:  Goals      Blood Pressure < 140/90     Quit smoking / using tobacco     Patient has been smoking more due to the stress of COVID and a recent move.         This is a list of the screening recommended for you and due dates:  Health Maintenance  Topic Date Due   Zoster (Shingles) Vaccine (1 of 2) Never done   Flu Shot  02/20/2021   COVID-19 Vaccine (4 - Booster for Pfizer series) 03/30/2021*   Colon Cancer Screening  04/17/2023   Tetanus Vaccine  12/29/2029   DEXA scan (bone density measurement)  Completed   Hepatitis C Screening: USPSTF Recommendation to screen - Ages 70-79 yo.  Completed   Pneumonia vaccines  Completed   HPV Vaccine  Aged Out  *Topic was postponed. The date shown is not the original due date.

## 2021-04-12 NOTE — Progress Notes (Signed)
SUBJECTIVE:   CHIEF COMPLAINT / HPI:   Scalp and facial lesions - 8-9 year duration - pruritic and bothersome for the last 3 years - no family history of this, no known infectious contacts - no known autoimmune disorders - no known skin injuries, prior lesions - some sun exposure during time in garden years ago, but always wore wide-brimmed hat - previously had two facial moles removed; however, scalp lesions morphologically dissimilar from these moles - also experiencing hair loss - she finds the cosmetic effect to be quite distressing - has been seen several times for it, encounter notes below - has tried fungal treatment with no improvement - initially thought to be med reaction from Aldactone, did not improve after med cessation - concerning for discoid lupus, however brief autoimmune panel 02/15/21 re-assuring  02/15/21 Dr. Susa Simmonds H&P Phenix Grein Morrow is a 75 y.o. female here to discuss possible medication reaction. States hairloss and blistery-type rash has gotten worse since starting Aldactone. Has itchy dry skin on arms and legs. Also has itchy patch on her back. Symptoms have been getting worse in the past month or so. Has had a scalp lesion for some time now.  Denies fever, new arthralgia, drainage from lesions. Has tried "skin cream" for extremities without relief. Was treated previously by Dr. Higinio Plan for "fungal infection"  on her scalp.   A&P Pt is a 75 yo female reports hx of RA and states sister has some type of Lupus with worsening facial and dry skin extremity rash. See images above. Scalp and facial rash concerning for discoid Lupus. Drug reaction as pt reports worsening since starting Aldactone.  Advised to hold Aldactone. Atypical eczema, dry skin and fungal rash possible. Given family hx is reasonable to obtain a autoimmune panel. Biopsy discussed with patient and she is agreeable. Pt to return for biopsy.             03/13/21 Dr. Susa Simmonds H&P Jakyrah Holladay  Wenke is a 75 y.o. female here for itchy rash follow up. She has used antifungals in the past without relief. She complains of significant hair loss. States her scalp itches and it feels like "bugs crawling". She denies itching anywhere else. No known bugs siting or bug bites. No one else has similar sx. She has hx of RA and her sister has Lupus.  She thought this was related to Aldactone previously but has since stopped taking this medication.  A&P Pt is a 75 yo female with personal and family hx of autoimmune disorder. Her recent dsDNA was negative. ANA positive and C3 within normal limits. DDx not limited to atypical eczema, fungal or autoimmune hair loss lesion. She reports being previously told she has psoriasis. She stopped taking aldactone without improvement. Doubt fungal given hx of multiple antifungals being used. Drug induced not likely her cause. Punch biopsy not attempted due to increased bleeding risk due to location. Discussed biopsy and scheduled for dermatology clinic. Pt agrees with plan.   PERTINENT  PMH / PSH: HTN, COPD, GERD, HLD, osteopenia, intermittent claudication, smoker, polyarthralgia  OBJECTIVE:   BP 130/75   Pulse 75   Wt 142 lb 9.6 oz (64.7 kg)   SpO2 96%   BMI 26.08 kg/m   PHQ-9:  Depression screen [facility] 2/9 03/14/2021 03/13/2021 02/15/2021  Decreased Interest 0 0 1  Down, Depressed, Hopeless 0 0 0  PHQ - 2 Score 0 0 1  Altered sleeping 1 1 1   Tired, decreased energy 0 0 1  Change in appetite 1 1 2   Feeling bad or failure about yourself  0 0 0  Trouble concentrating 0 0 0  Moving slowly or fidgety/restless 0 0 0  Suicidal thoughts 0 0 0  PHQ-9 Score 2 2 5   Difficult doing work/chores Somewhat difficult - Somewhat difficult  Some recent data might be hidden     GAD-7: No flowsheet data found.   Physical Exam General: Awake, alert, oriented, no acute distress Respiratory: Unlabored respirations, speaking in full sentences, no respiratory distress Skin:  Lesions overlying facial/scalp border large irregular shaped with concentric hyperpigmentation and central clearing, no flakiness, erythema, petechiae; superior scalp with scattered coalescing patches of hypopigmentation  PROCEDURE: Punch biopsy  Consent signed and scanned into record.  Pain control: 1 mL lidocaine 1% with epi Preparation: area cleansed with alcohol prior to anesthesia, iodine before punch biopsy Time out taken Skin cleaned thoroughly with alcohol wipe.  1 mL lidocaine 1% with epi injected in circumferential pattern underneath targeted biopsy area. Punch biopsy inserted, advanced fully then removed.  Pickups used to remove biopsy area. Scissors used to sever sample from underlying layer.  Patient tolerated this well. Hemostasis easily achieved with pressure and application of silver nitrite. Suture not indicated for incision site this small.   ASSESSMENT/PLAN:   Rash Punch biopsy obtained from left temporal scalp region, sent for pathology.  Suspect autoimmune process despite negative/normal dsDNA AB, serum complement C3, and ANA.  Aftercare instructions given along with pertinent return precautions.     Ezequiel Essex, MD [place]

## 2021-04-13 ENCOUNTER — Other Ambulatory Visit: Payer: Self-pay

## 2021-04-13 ENCOUNTER — Ambulatory Visit (INDEPENDENT_AMBULATORY_CARE_PROVIDER_SITE_OTHER): Payer: Medicare Other | Admitting: Family Medicine

## 2021-04-13 VITALS — BP 130/75 | HR 75 | Wt 142.6 lb

## 2021-04-13 DIAGNOSIS — R21 Rash and other nonspecific skin eruption: Secondary | ICD-10-CM | POA: Diagnosis not present

## 2021-04-13 DIAGNOSIS — L989 Disorder of the skin and subcutaneous tissue, unspecified: Secondary | ICD-10-CM | POA: Diagnosis not present

## 2021-04-13 DIAGNOSIS — L308 Other specified dermatitis: Secondary | ICD-10-CM | POA: Diagnosis not present

## 2021-04-13 NOTE — Patient Instructions (Signed)
It was wonderful to meet you today. Thank you for allowing me to be a part of your care. Below is a short summary of what we discussed at your visit today:  Skin biopsy - please keep the area clean and dry - we have included after care instructions in your paperwork - please call us if you develop redness, swelling, pain, or drainage from the site - if you begin to bleed from the biopsy site, please use gauze and hold pressure for a full 5-10 minutes   Please bring all of your medications to every appointment!  If you have any questions or concerns, please do not hesitate to contact us via phone or MyChart message.   Ezequiel Essex, MD

## 2021-04-14 NOTE — Assessment & Plan Note (Signed)
Punch biopsy obtained from left temporal scalp region, sent for pathology.  Suspect autoimmune process despite negative/normal dsDNA AB, serum complement C3, and ANA.  Aftercare instructions given along with pertinent return precautions.

## 2021-04-15 ENCOUNTER — Other Ambulatory Visit: Payer: Self-pay | Admitting: Family Medicine

## 2021-04-20 ENCOUNTER — Encounter: Payer: Self-pay | Admitting: Family Medicine

## 2021-04-20 ENCOUNTER — Telehealth: Payer: Self-pay | Admitting: Family Medicine

## 2021-04-20 MED ORDER — TRIAMCINOLONE ACETONIDE 0.5 % EX CREA: 1.0000 "application " | TOPICAL_CREAM | Freq: Two times a day (BID) | CUTANEOUS | 0 refills | Status: DC

## 2021-04-20 NOTE — Addendum Note (Signed)
Addended by: Renard Hamper on: 04/20/2021 02:09 PM   Modules accepted: Orders

## 2021-04-20 NOTE — Telephone Encounter (Signed)
Derm path results consistent with vacuolar interface dermatitis. Concern for possible discoid lupus, which would explain path results along with normal/negative systemic autoimmune panel previously. Will prescribe triamcinolone 0.5% cream BID. Follow up next available derm clinic.  Called patient with instructions. All questions answered. Scheduled for derm clinic follow up on 11/17 at 2:10pm.   Ezequiel Essex, MD

## 2021-05-03 ENCOUNTER — Other Ambulatory Visit: Payer: Self-pay | Admitting: Family Medicine

## 2021-05-03 DIAGNOSIS — I10 Essential (primary) hypertension: Secondary | ICD-10-CM

## 2021-06-08 ENCOUNTER — Ambulatory Visit (INDEPENDENT_AMBULATORY_CARE_PROVIDER_SITE_OTHER): Payer: Medicare Other | Admitting: Family Medicine

## 2021-06-08 ENCOUNTER — Other Ambulatory Visit: Payer: Self-pay

## 2021-06-08 VITALS — BP 123/75 | HR 74 | Ht 62.0 in | Wt 142.8 lb

## 2021-06-08 DIAGNOSIS — L308 Other specified dermatitis: Secondary | ICD-10-CM

## 2021-06-08 DIAGNOSIS — Z23 Encounter for immunization: Secondary | ICD-10-CM | POA: Diagnosis not present

## 2021-06-08 MED ORDER — CLOBETASOL PROPIONATE 0.05 % EX OINT: 1.0000 "application " | TOPICAL_OINTMENT | Freq: Two times a day (BID) | CUTANEOUS | 0 refills | Status: DC

## 2021-06-08 NOTE — Progress Notes (Signed)
    SUBJECTIVE:   CHIEF COMPLAINT / HPI:   [name] Page (MRN: [id number]) is a 75 y.o. female with a history of HTN, CAD, COPD, and GERD who presents for follow up of skin lesions.  Scalp and facial lesions Patient's scalp and facial lesions have improved minimally since being prescribed triamcinolone 0.5% cream in September 2022. She has been applying the cream to the areas in the morning and at night consistently. She recalls having some drainage from "bumps" on her scalp a couple of times throughout this period, though she is unsure of the characteristics of this drainage. She also is worried about some hair loss when she wakes in the morning on her pillow and also on her brush. However, she feels like the hair loss has improved over the period of steroid treatment.  Per Dr. Arby Barrette note after patient's last visit: Derm path results consistent with vacuolar interface dermatitis. Concern for possible discoid lupus, which would explain path results along with normal/negative systemic autoimmune panel previously. Will prescribe triamcinolone 0.5% cream BID. Follow up next available derm clinic.  OBJECTIVE:   BP 123/75   Pulse 74   Ht 5\' 2"  (1.575 m)   Wt 142 lb 12.8 oz (64.8 kg)   SpO2 98%   BMI 26.12 kg/m    PHYSICAL EXAM  GEN: Well-developed, in NAD HEAD: NCAT CVS: Regular rate RESP: Breathing comfortably on RA PSY: Normal mood and affect, alert and oriented SKIN: Two 3-4 cm, hyperpigmented, raised plaques with central clearing on right temple; one similar plaque on left temple; diffuse, patchy hypopigmentation along scalp EXT: Moves all extremities grossly equally        ASSESSMENT/PLAN:   Vacuolar interface dermatitis Patient's biopsy results from last visit confirms diagnosis of vacuolar interface dermatitis, which is consistent with discoid lupus. Given minimal improvement with triamcinolone 0.5% cream, will change to higher potency steroid, clobetasol 0.05% ointment  to be applied BID. Will also refer to dermatology for further evaluation in the meantime. Follow up with Dr. Alba Cory in 2 weeks to discuss improvement with new steroid regimen.   Health maintenance Patient received the flu vaccine today without incident.  [place]  I was present during key history taking and physical exam and any procedures.  I agree with the note above Samella Parr MD

## 2021-06-08 NOTE — Patient Instructions (Signed)
It was great seeing you today!  We are prescribing clobetasol ointment to use twice a day for the next 2 weeks, and then recommend going back to the triamcinolone cream that you have until you are able to see a dermatologist.  We have placed that referral and you should hear back within the next couple of weeks, but if you do not hear anything please call the clinic so we can check on the referral.  Please check-out at the front desk before leaving the clinic.  Schedule to see your PCP in about 2 weeks to check on your skin, but if you need to be seen earlier than that for any new issues we're happy to fit you in, just give [place] a call!   Feel free to call with any questions or concerns at any time, at [phone].   Take care,  Dr. Shary Key [facility]

## 2021-06-08 NOTE — Assessment & Plan Note (Signed)
Patient's biopsy results from last visit confirms diagnosis of vacuolar interface dermatitis, which is consistent with discoid lupus. Given minimal improvement with triamcinolone 0.5% cream, will change to higher potency steroid, clobetasol 0.05% ointment to be applied BID. Will also refer to dermatology for further evaluation in the meantime. Follow up with Dr. Alba Page in 2 weeks to discuss improvement with new steroid regimen.

## 2021-06-09 NOTE — Progress Notes (Signed)
I was present during key history taking and physical exam and any procedures.  I agree with the note above Samella Parr MD

## 2021-06-13 ENCOUNTER — Other Ambulatory Visit: Payer: Self-pay | Admitting: Family Medicine

## 2021-06-21 NOTE — Progress Notes (Signed)
    SUBJECTIVE:   CHIEF COMPLAINT / HPI: facial rash   Patient started on clobetasol 0.05% ointment to be applied BID per last visit with dermatology clinic. Presents today for follow up regarding new topical steroid regimen. Patient was also referred to dermatology for vacuolar dermatitis at this time. She states that she has noticed that her eyes have been itchy, burning and gritty feeling since using both topicals. Eye redness is constant. She reports having two days of trialing without the clobetasol to see if it would clear up her eyes, but she has not noticed a change.   PERTINENT  PMH / PSH:  Vacuolar facial dermatitis   OBJECTIVE:   BP (!) 142/75   Pulse 69   Ht 5\' 2"  (1.575 m)   Wt 143 lb 6.4 oz (65 kg)   SpO2 100%   BMI 26.23 kg/m   General: female appearing stated age in no acute distress HEENT: conjunctival injection bilaterally, worse in left eye, no drainage from either eye, no periorbital swelling nor ecchymosis, MMM, no oral lesions noted Cardio: Normal S1 and S2, no S3 or S4. Rhythm is regular. No murmurs or rubs.  Bilateral radial pulses palpable Skin:   Media Information Document Information  Photos    06/22/2021 11:51  Attached To:  Office Visit on 06/22/21 with Simmons-Robinson, Riki Sheer, MD   Source Information  Simmons-Robinson, Riki Sheer, MD  [facility]    ASSESSMENT/PLAN:   Vacuolar interface dermatitis F/u for steroid topical regimen w/ diagnosis of discoid lupus Recommended to apply barrier of vaseline around eyes prior to application of clobetasol and to wash hands thoroughly after application  Recommended follow up with ophthalmology for red eyes that may be 2/2 to new topical therapy with clobetasol so close to eyes Patient is happy with improvement of rash  And would like to continue therapy Patient given list of ophthalmology offices in the area  Given concern for dry eyes and hx of lupus, discussed likely rheumatology f/u in coming  weeks for further evaluation F/u with [facility] after ophthalmology evaluation recommended      Eulis Foster, MD [place]

## 2021-06-22 ENCOUNTER — Ambulatory Visit (INDEPENDENT_AMBULATORY_CARE_PROVIDER_SITE_OTHER): Payer: Medicare Other | Admitting: Family Medicine

## 2021-06-22 ENCOUNTER — Encounter: Payer: Self-pay | Admitting: Family Medicine

## 2021-06-22 ENCOUNTER — Other Ambulatory Visit: Payer: Self-pay

## 2021-06-22 VITALS — BP 142/75 | HR 69 | Ht 62.0 in | Wt 143.4 lb

## 2021-06-22 DIAGNOSIS — L308 Other specified dermatitis: Secondary | ICD-10-CM | POA: Diagnosis not present

## 2021-06-22 NOTE — Patient Instructions (Signed)
I recommend that you schedule an appointment with an ophthalmologist to evaluate the eye changes. It is likely that the new medication is related to the symptoms.   I recommend washing your hands 1-2 times after applying the cream to make sure it is not being transferred to your eyes.

## 2021-06-22 NOTE — Assessment & Plan Note (Signed)
F/u for steroid topical regimen w/ diagnosis of discoid lupus Recommended to apply barrier of vaseline around eyes prior to application of clobetasol and to wash hands thoroughly after application  Recommended follow up with ophthalmology for red eyes that may be 2/2 to new topical therapy with clobetasol so close to eyes Patient is happy with improvement of rash  And would like to continue therapy Patient given list of ophthalmology offices in the area  Given concern for dry eyes and hx of lupus, discussed likely rheumatology f/u in coming weeks for further evaluation F/u with [facility] after ophthalmology evaluation recommended

## 2021-06-30 ENCOUNTER — Other Ambulatory Visit: Payer: Self-pay | Admitting: Family Medicine

## 2021-06-30 DIAGNOSIS — K219 Gastro-esophageal reflux disease without esophagitis: Secondary | ICD-10-CM

## 2021-07-07 ENCOUNTER — Other Ambulatory Visit: Payer: Self-pay | Admitting: Family Medicine

## 2021-07-07 DIAGNOSIS — I2510 Atherosclerotic heart disease of native coronary artery without angina pectoris: Secondary | ICD-10-CM

## 2021-08-28 ENCOUNTER — Other Ambulatory Visit: Payer: Self-pay | Admitting: Family Medicine

## 2021-08-28 DIAGNOSIS — I10 Essential (primary) hypertension: Secondary | ICD-10-CM

## 2021-09-16 ENCOUNTER — Other Ambulatory Visit: Payer: Self-pay | Admitting: Family Medicine

## 2021-09-16 DIAGNOSIS — K219 Gastro-esophageal reflux disease without esophagitis: Secondary | ICD-10-CM

## 2021-09-18 ENCOUNTER — Other Ambulatory Visit: Payer: Self-pay | Admitting: Family Medicine

## 2021-09-18 DIAGNOSIS — K219 Gastro-esophageal reflux disease without esophagitis: Secondary | ICD-10-CM

## 2021-10-11 ENCOUNTER — Other Ambulatory Visit: Payer: Self-pay | Admitting: Family Medicine

## 2021-11-02 ENCOUNTER — Other Ambulatory Visit: Payer: Self-pay | Admitting: Family Medicine

## 2021-11-02 DIAGNOSIS — I10 Essential (primary) hypertension: Secondary | ICD-10-CM

## 2021-12-15 ENCOUNTER — Other Ambulatory Visit: Payer: Self-pay | Admitting: Family Medicine

## 2022-01-19 ENCOUNTER — Ambulatory Visit (INDEPENDENT_AMBULATORY_CARE_PROVIDER_SITE_OTHER): Payer: Medicare Other | Admitting: Family Medicine

## 2022-01-19 ENCOUNTER — Encounter: Payer: Self-pay | Admitting: Family Medicine

## 2022-01-19 VITALS — BP 132/68 | HR 67 | Ht 62.0 in | Wt 143.2 lb

## 2022-01-19 DIAGNOSIS — E7800 Pure hypercholesterolemia, unspecified: Secondary | ICD-10-CM | POA: Diagnosis not present

## 2022-01-19 DIAGNOSIS — I10 Essential (primary) hypertension: Secondary | ICD-10-CM

## 2022-01-19 DIAGNOSIS — D229 Melanocytic nevi, unspecified: Secondary | ICD-10-CM | POA: Diagnosis not present

## 2022-01-19 DIAGNOSIS — M62838 Other muscle spasm: Secondary | ICD-10-CM

## 2022-01-19 MED ORDER — HYDROCHLOROTHIAZIDE 25 MG PO TABS: 12.5000 mg | ORAL_TABLET | Freq: Every day | ORAL | 1 refills | Status: DC

## 2022-01-19 NOTE — Patient Instructions (Signed)
Your blood pressure is well controlled today.  Please continue your amlodipine, hydrochlorothiazide, losartan as we discussed.  I have updated your hydrochlorothiazide to say 12.5 mg daily.  We will check your kidney function as well as electrolytes today.  We will also check your cholesterol levels today.  I will notify you of any abnormal results and decide if we need to make any medication changes at that time.  Please schedule with the dermatology clinic regarding the area on your back that she would like to have evaluated for potential removal.

## 2022-01-19 NOTE — Assessment & Plan Note (Signed)
Patient reports adherence to pravastatin 40 mg daily We will check lipid panel today

## 2022-01-19 NOTE — Assessment & Plan Note (Addendum)
Blood pressure well controlled today, without symptoms of hypotension  Patient to continue amlodipine 10, Zebeta 5 mg, hydrochlorothiazide 12.5 mg and losartan 100 mg We will check CMP today Patient to follow-up in 3 months for blood pressure

## 2022-01-19 NOTE — Progress Notes (Unsigned)
    SUBJECTIVE:   CHIEF COMPLAINT / HPI: HTN   Hypertension: - Medications: amlodipine 10, HCTZ, losartan, bisoprolol - Compliance: reports regular adherence  - Checking BP at home: yes, similar to today's measurements  -reports she can feel when the numbers are too high or too low  - Denies any SOB, CP, vision changes, LE edema, medication SEs, or symptoms of hypotension  HLD  Patient reports adherence to pravastatin therapy She is agreeable to lipid panel today  Nevus Patient reports that she has had darkened area on her back for several years She states that it has not changed size over the course of the years She states that it is not tender but she has noticed that it often gets caught on her clothing takes and would like to have it removed She denies any bleeding or drainage from the area    PERTINENT  PMH / PSH:  HTN  CAD  GERD   OBJECTIVE:   BP 132/68   Pulse 67   Ht '5\' 2"'$  (1.575 m)   Wt 143 lb 3.2 oz (65 kg)   SpO2 100%   BMI 26.19 kg/m   Physical Exam Constitutional:      General: She is not in acute distress.    Appearance: She is normal weight. She is not ill-appearing, toxic-appearing or diaphoretic.  HENT:     Right Ear: External ear normal.     Left Ear: External ear normal.     Nose: Nose normal. No congestion or rhinorrhea.     Mouth/Throat:     Pharynx: Oropharynx is clear.  Eyes:     Conjunctiva/sclera: Conjunctivae normal.  Cardiovascular:     Rate and Rhythm: Normal rate and regular rhythm.     Pulses: Normal pulses.     Heart sounds: Normal heart sounds. No murmur heard.    No friction rub. No gallop.  Pulmonary:     Effort: Pulmonary effort is normal.     Breath sounds: Normal breath sounds. No wheezing, rhonchi or rales.  Abdominal:     General: Bowel sounds are normal. There is no distension.     Palpations: Abdomen is soft.     Tenderness: There is no abdominal tenderness.  Musculoskeletal:     Cervical back: Normal range of  motion.  Skin:    General: Skin is warm.     Findings: No erythema or rash.     Comments: Nevus, see media tab   Neurological:     General: No focal deficit present.     Mental Status: She is alert and oriented to person, place, and time.      ASSESSMENT/PLAN:   HYPERCHOLESTEROLEMIA Patient reports adherence to pravastatin 40 mg daily We will check lipid panel today   Nevus Patient with nevus that has been present on her left upper back near her trapezius She would like to have this removed Discussed dermatology clinic option for evaluation for excision, patient plans to call for appointment in the near future   Essential hypertension Blood pressure well controlled today, without symptoms of hypotension  Patient to continue amlodipine 10, Zebeta 5 mg, hydrochlorothiazide 12.5 mg and losartan 100 mg We will check CMP today Patient to follow-up in 3 months for blood pressure     Eulis Foster, MD [place]

## 2022-01-19 NOTE — Assessment & Plan Note (Signed)
Patient with nevus that has been present on her left upper back near her trapezius She would like to have this removed Discussed dermatology clinic option for evaluation for excision, patient plans to call for appointment in the near future

## 2022-01-20 LAB — COMPREHENSIVE METABOLIC PANEL
ALT: 12 IU/L (ref 0–32)
AST: 14 IU/L (ref 0–40)
Albumin/Globulin Ratio: 1.8 (ref 1.2–2.2)
Albumin: 4.7 g/dL (ref 3.7–4.7)
Alkaline Phosphatase: 65 IU/L (ref 44–121)
BUN/Creatinine Ratio: 10 — ABNORMAL LOW (ref 12–28)
BUN: 8 mg/dL (ref 8–27)
Bilirubin Total: 0.3 mg/dL (ref 0.0–1.2)
CO2: 26 mmol/L (ref 20–29)
Calcium: 10.4 mg/dL — ABNORMAL HIGH (ref 8.7–10.3)
Chloride: 98 mmol/L (ref 96–106)
Creatinine, Ser: 0.77 mg/dL (ref 0.57–1.00)
Globulin, Total: 2.6 g/dL (ref 1.5–4.5)
Glucose: 89 mg/dL (ref 70–99)
Potassium: 4.5 mmol/L (ref 3.5–5.2)
Sodium: 137 mmol/L (ref 134–144)
Total Protein: 7.3 g/dL (ref 6.0–8.5)
eGFR: 80 mL/min/{1.73_m2} (ref >59)

## 2022-01-20 LAB — LIPID PANEL
Chol/HDL Ratio: 2.7 ratio (ref 0.0–4.4)
Cholesterol, Total: 155 mg/dL (ref 100–199)
HDL: 57 mg/dL (ref >39)
LDL Chol Calc (NIH): 70 mg/dL (ref 0–99)
Triglycerides: 170 mg/dL — ABNORMAL HIGH (ref 0–149)
VLDL Cholesterol Cal: 28 mg/dL (ref 5–40)

## 2022-01-20 LAB — MAGNESIUM: Magnesium: 2.1 mg/dL (ref 1.6–2.3)

## 2022-02-15 NOTE — Progress Notes (Signed)
Cardiology Office Note:    Date:  02/16/2022   ID:  [name] Page, DOB February 24, 1946, MRN [id number]  PCP:  [name], MD   [place]  Cardiologist:  None  Advanced Practice Provider:  No care team member to display Electrophysiologist:  None    Referring MD: [name], MD     History of Present Illness:    [name] Page is a 76 y.o. female with a hx of HTN, COPD, coronary artery disease with history of MI, and HLD who presents to clinic for follow-up.  The patient states that she has a history of MI in 1997 where a PCI x2. TTE 06/2020 with normal LVEF, moderate LVH, moderate MAC, aortic sclerosis, RAP 20mHg.  Was last seen in clinic on 09/2020 where she felt well. BP elevated at that time and she was started on spironlactone with improvement.  Today, the patient overall feels well. The patient has occasional palpitations or "skipped beats." Most often noticed at night when she lays down to sleep. Each episode lasts a few seconds and repeats for about 134m before going away. No associated lightheadedness, dizziness, chest pain or SOB. Can occur 2x/week and then not happen again for several weeks.   Otherwise doing well with no chest pain, SOB, orthopnea , PND or LE edema. Remains active without exertional symptoms. Blood pressure is well controlled. Tolerating medications without issues.  Past Medical History:  Diagnosis Date   Allergic rhinitis 09/19/2006   Qualifier: Diagnosis of  By: HaHenderson BaltimoreD, Jessica     Allergy    Arthritis    [place] tarry stools    from iron pills   CHF (congestive heart failure) ([facility]1997   Colon polyps 2005   COPD (chronic obstructive pulmonary disease) (HCC)    GERD (gastroesophageal reflux disease)    Heart attack ([facility]1997 and 2001   Hyperlipidemia    Hypertension    Otomycosis of both ears 12/28/2019   Pneumonia 2012   Status post dilation of esophageal narrowing    TMJ dysfunction 03/27/2019     Past Surgical History:  Procedure Laterality Date   CESAREAN SECTION  01/1976   COLONOSCOPY     CORONARY ANGIOPLASTY WITH STENT PLACEMENT  2002   POLYPECTOMY      Current Medications: Current Meds  Medication Sig   amLODipine (NORVASC) 10 MG tablet TAKE 1 TABLET(10 MG) BY MOUTH DAILY   aspirin EC 81 MG tablet Take 81 mg by mouth daily.   bisoprolol (ZEBETA) 5 MG tablet TAKE 1 TABLET(5 MG) BY MOUTH DAILY   cetirizine (ZYRTEC) 10 MG tablet TAKE 1 TABLET(10 MG) BY MOUTH DAILY   cilostazol (PLETAL) 100 MG tablet TAKE 1 TABLET(100 MG) BY MOUTH TWICE DAILY   diclofenac Sodium (VOLTAREN) 1 % GEL Apply 4 g topically 4 (four) times daily as needed.   famotidine (PEPCID) 20 MG tablet TAKE 1 TABLET(20 MG) BY MOUTH TWICE DAILY   hydrochlorothiazide (HYDRODIURIL) 25 MG tablet Take 0.5 tablets (12.5 mg total) by mouth daily.   hydrocortisone 2.5 % ointment Apply topically 2 (two) times daily. As needed for outer portion of ear canal.  Do not use for more than 1-2 weeks at a time.   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     Patient has close relationships with her daughter, [name] Page, and surrounding family in the area.    Patient enjoys cooking and writing poetry. 8.23.22 - pt does cook as much. Pt enjoys word searches and reading.     Patient is separated from her husband, however they maintain a close friendship.    Social Determinants of Health   Financial Resource Strain: Low Risk  (03/14/2021)   Overall Financial Resource Strain (CARDIA)    Difficulty of Paying Living Expenses: Not hard at all  Food Insecurity: No Food Insecurity (03/14/2021)   Hunger Vital Sign    Worried About Running Out of Food in the Last Year: Never true    Ran Out of Food in the Last Year: Never true  Transportation Needs: No Transportation Needs (03/14/2021)   PRAPARE - Hydrologist (Medical): No    Lack of Transportation (Non-Medical): No  Physical Activity: Sufficiently Active (03/14/2021)   Exercise Vital Sign    Days of Exercise per Week: 4 days    Minutes of Exercise per Session: 40 min  Stress: No Stress Concern Present (03/14/2021)   [place]    Feeling of Stress : Not at all  Social Connections: Moderately Isolated (03/14/2021)   Social Connection and Isolation Panel [NHANES]    Frequency of Communication with Friends and Family: More than three times a week    Frequency of Social Gatherings with Friends and Family: Twice a week    Attends Religious Services: 1 to 4 times per year    Active Member of [employer] or Organizations: No    Attends Archivist Meetings: Never    Marital Status: Separated     Family History: The patient's family history includes Cancer in her mother; Clotting disorder in her brother; Colon cancer (age of onset: 25) in her mother; Diabetes in her sister; Esophageal cancer in her sister; Heart disease in her father. There is no history of Rectal cancer or Stomach cancer.  ROS:   Please see the  history of present illness.    Review of Systems  Constitutional:  Negative for chills and fever.  HENT:  Negative for hearing loss.   Eyes:  Negative for blurred vision and redness.  Respiratory:  Negative for shortness of breath.   Cardiovascular:  Negative for chest pain, palpitations, orthopnea, claudication, leg swelling and PND.  Gastrointestinal:  Negative for nausea and vomiting.  Genitourinary:  Negative for dysuria and flank pain.  Musculoskeletal:  Positive for joint pain.  Neurological:  Negative for dizziness and loss of consciousness.  Endo/Heme/Allergies:  Negative for polydipsia.  Psychiatric/Behavioral:  Negative for substance abuse.     EKGs/Labs/Other Studies Reviewed:    The following studies were reviewed today: TTE Jul 28, 2020:  1. Left ventricular ejection fraction, by estimation, is 60 to 65%. The  left ventricle has normal function. The left ventricle has no regional  wall motion abnormalities. There is moderate left ventricular hypertrophy  of the basal-septal segment. Left  ventricular diastolic parameters are consistent with Grade I diastolic  dysfunction (impaired relaxation). Elevated left atrial pressure.   2. Right ventricular systolic function is normal. The right ventricular  size is normal.   3. There is mild thickening of the mitral valve leaflets. There is mild  calcification of the mitral valve leaflet. Mild to moderate mitral annular  calcification most notably on the posterior annulus. Trivial mitral valve  regurgitation.   4. The aortic valve is tricuspid. There is mild calcification of the  aortic valve. There is mild thickening of the aortic valve. Aortic valve  regurgitation is trivial. Mild aortic valve sclerosis is present, with no  evidence of aortic valve stenosis.   5. The inferior vena cava is normal in size with greater than 50%  respiratory variability, suggesting right atrial pressure of 3 mmHg.  Nuclear Stress  11/11/2001: FINDINGS  CLINICAL DATA:  CORONARY ARTERY DISEASE AND PERIPHERAL CORONARY STENT PLACEMENT.  CHEST PAIN AND  HYPERTENSION.  MYOCARDIAL PERFUSION IMAGING WITH SPECT AT REST AND STRESS, GATED LEFT VENTRICULAR WALL MOTION  STUDY, AND EJECTION FRACTION  THE PATIENT WAS INJECTED WITH 10 MILLICURIES OF 35TSV CARDIOLITE AT REST, AND MYOCARDIAL SPECT WAS  PERFORMED.  EXERCISE TREADMILL TESTING WAS SUBSEQUENTLY PERFORMED UNDER THE SUPERVISION OF THE  CARDIOLOGY STAFF.  THE PATIENT ACHIEVED TARGET HEART RATE, AND AT PEAK STRESS WAS INJECTED WITH 30  MILLICURIES OF 77LTJ CARDIOLITE.  GATED LEFT VENTRICULAR WALL MOTION STUDY WAS PERFORMED, WITH LEFT  VENTRICULAR EJECTION FRACTION.  THE SPECT IMAGES SHOW DECREASED ACTIVITY IN THE ANTERIOR WALL ON BOTH THE STRESS AND REST IMAGES,  WITHOUT EVIDENCE OF REVERSIBILITY.  THIS IS MOST CONSISTENT WITH BREAST ATTENUATION ARTIFACT.  NO  REVERSIBLE MYOCARDIAL PERFUSION DEFECTS ARE SEEN TO SUGGEST THE PRESENCE OF  MYOCARDIAL ISCHEMIA.  THE LEFT VENTRICULAR WALL MOTION STUDY SHOWS NO EVIDENCE OF REGIONAL LEFT VENTRICULAR WALL MOTION  ABNORMALITY.  THE CALCULATED LEFT VENTRICULAR EJECTION FRACTION IS 53%.  IMPRESSION  1.  NO EVIDENCE OF EXERCISE-INDUCED MYOCARDIAL ISCHEMIA.  2.  CALCULATED LEFT VENTRICULAR EJECTION FRACTION OF 53%.  EKG:  EKG 02/16/22: NSR with HR 68  Recent Labs: 01/19/2022: ALT 12; BUN 8; Creatinine, Ser 0.77; Magnesium 2.1; Potassium 4.5; Sodium 137  Recent Lipid Panel    Component Value Date/Time   CHOL 155 01/19/2022 1606   TRIG 170 (H) 01/19/2022 1606   HDL 57 01/19/2022 1606   CHOLHDL 2.7 01/19/2022 1606   CHOLHDL 4.0 11/07/2015 1604   VLDL 49 (H) 11/07/2015 1604   LDLCALC 70 01/19/2022 1606      Physical Exam:    VS:  BP 120/70   Pulse 68   Ht _0  (1.702 m)   Wt 139 lb 12.8 oz (63.4 kg)   SpO2 95%   BMI 21.90 kg/m     Wt Readings from Last 3 Encounters:  02/16/22 139 lb 12.8 oz (63.4 kg)  01/19/22 143 lb 3.2  oz (65 kg)  06/22/21 143 lb 6.4 oz (65 kg)     GEN:  Comfortable, NAD HEENT: Normal NECK: No JVD; No carotid bruits CARDIAC: RRR, 2/6 systolic murmur at RUSB  RESPIRATORY:  Clear to auscultation without rales, wheezing or rhonchi  ABDOMEN: Soft, non-tender, non-distended MUSCULOSKELETAL:  No edema; No deformity  SKIN: Warm and dry NEUROLOGIC:  Alert and oriented x 3 PSYCHIATRIC:  Normal affect   ASSESSMENT:    1. Coronary artery disease involving native coronary artery of native heart without angina pectoris   2. Essential hypertension   3. HYPERCHOLESTEROLEMIA   4. Palpitations   5. Murmur     PLAN:    In order of problems listed above:  #Coronary Artery Disease with Remote MI: Had chest pain after having the TTE as she states they "pressed hard on her chest." TTE with normal LVEF 60-65%, moderate LVH, mild-to-moderate MAC, mild sclerosis. No recurrent chest pain and no exertional symptoms currently. No SOB, LE edema, orthopnea, lightheadedness, or dizziness.  -Continue ASA 30m daily -Continue prava 421mdaily -Losartan 10081maily -Bisoprolol 5mg24mily  #Palpitations: Occurring infrequently while laying down at night. Discussed option of zio monitor, but patient would like to wait and only pursue if symptoms increase.  -Can pursue cardiac monitor if symptoms increase  #HTN: Well controlled and at goal  -Continue Losartan 100mg72mly -Continue Bisoprolol 5mg d44my -Continue HCTZ 12.5mg da65m -Continue Amlodipine 10mg da64m-Continue spiro 25mg dai86mLow Na diet  #HLD: -Continue pravastatin 40mg dail14mDL well controlled 70 -Continue lifestyle modifications  #Systolic Murmur: TTE with aortic sclerosis in 2021. Will continue to monitor.    Medication Adjustments/Labs and Tests Ordered: Current medicines are reviewed at length with the patient today.  Concerns regarding medicines are outlined above.  Orders Placed This Encounter  Procedures   EKG 12-Lead    No orders of the defined types were placed in this encounter.   Patient Instructions  Medication Instructions:   Your physician recommends that you continue on your current medications as directed. Please refer to the Current Medication list given to you today.  *If you need a refill on your cardiac medications before your next appointment, please call your pharmacy*   Follow-Up: [facility].  As part of our continuing mission to provide you with exceptional heart care, we have created designated Provider Care Teams.  These Care Teams include your primary Cardiologist (physician) and Advanced Practice Providers (APPs -  Physician Assistants and Nurse Practitioners) who all work together to provide you with the care you need, when you need it.  We recommend signing up for the patient portal called "MyChart".  Sign up information is provided on this After Visit Summary.  MyChart is used to connect with patients for Virtual Visits (Telemedicine).  Patients are able to view lab/test results, encounter notes, upcoming appointments, etc.  Non-urgent messages can be sent to your provider as well.   To learn more about what you can do with MyChart, go to [email or web address] appointment:   6 month(s)  The format for your next appointment:   In Person  Provider:   DR. Braeden Dolinski Johney Framet Information About Sugar          Signed, Braylie Badami E Freada Bergeron/2023 10:53 AM    [facility]

## 2022-02-16 ENCOUNTER — Ambulatory Visit: Payer: Medicare Other | Admitting: Cardiology

## 2022-02-16 ENCOUNTER — Encounter: Payer: Self-pay | Admitting: Cardiology

## 2022-02-16 VITALS — BP 120/70 | HR 68 | Ht 67.0 in | Wt 139.8 lb

## 2022-02-16 DIAGNOSIS — R002 Palpitations: Secondary | ICD-10-CM | POA: Diagnosis not present

## 2022-02-16 DIAGNOSIS — I10 Essential (primary) hypertension: Secondary | ICD-10-CM

## 2022-02-16 DIAGNOSIS — R011 Cardiac murmur, unspecified: Secondary | ICD-10-CM | POA: Diagnosis not present

## 2022-02-16 DIAGNOSIS — I2510 Atherosclerotic heart disease of native coronary artery without angina pectoris: Secondary | ICD-10-CM

## 2022-02-16 DIAGNOSIS — E7800 Pure hypercholesterolemia, unspecified: Secondary | ICD-10-CM

## 2022-02-16 NOTE — Patient Instructions (Signed)
Medication Instructions:   Your physician recommends that you continue on your current medications as directed. Please refer to the Current Medication list given to you today.  *If you need a refill on your cardiac medications before your next appointment, please call your pharmacy*   Follow-Up: At [facility], you and your health needs are our priority.  As part of our continuing mission to provide you with exceptional heart care, we have created designated Provider Care Teams.  These Care Teams include your primary Cardiologist (physician) and Advanced Practice Providers (APPs -  Physician Assistants and Nurse Practitioners) who all work together to provide you with the care you need, when you need it.  We recommend signing up for the patient portal called "MyChart".  Sign up information is provided on this After Visit Summary.  MyChart is used to connect with patients for Virtual Visits (Telemedicine).  Patients are able to view lab/test results, encounter notes, upcoming appointments, etc.  Non-urgent messages can be sent to your provider as well.   To learn more about what you can do with MyChart, go to [email or web address].    Your next appointment:   6 month(s)  The format for your next appointment:   In Person  Provider:   DR. Johney Frame  Important Information About Sugar

## 2022-02-21 ENCOUNTER — Other Ambulatory Visit: Payer: Self-pay | Admitting: Family Medicine

## 2022-02-21 DIAGNOSIS — I10 Essential (primary) hypertension: Secondary | ICD-10-CM

## 2022-03-15 ENCOUNTER — Other Ambulatory Visit: Payer: Self-pay | Admitting: Family Medicine

## 2022-03-15 DIAGNOSIS — I10 Essential (primary) hypertension: Secondary | ICD-10-CM

## 2022-03-17 ENCOUNTER — Other Ambulatory Visit: Payer: Self-pay

## 2022-03-17 DIAGNOSIS — K219 Gastro-esophageal reflux disease without esophagitis: Secondary | ICD-10-CM

## 2022-03-19 MED ORDER — FAMOTIDINE 20 MG PO TABS
ORAL_TABLET | ORAL | 1 refills | Status: DC
Start: 2022-03-19 — End: 2023-01-28

## 2022-04-09 ENCOUNTER — Other Ambulatory Visit: Payer: Self-pay | Admitting: Family Medicine

## 2022-04-30 ENCOUNTER — Other Ambulatory Visit: Payer: Self-pay | Admitting: Family Medicine

## 2022-04-30 DIAGNOSIS — I10 Essential (primary) hypertension: Secondary | ICD-10-CM

## 2022-06-08 ENCOUNTER — Encounter: Payer: Self-pay | Admitting: Family Medicine

## 2022-06-08 ENCOUNTER — Ambulatory Visit (INDEPENDENT_AMBULATORY_CARE_PROVIDER_SITE_OTHER): Payer: Medicare Other | Admitting: Family Medicine

## 2022-06-08 VITALS — BP 140/67 | HR 76 | Ht 67.0 in | Wt 140.4 lb

## 2022-06-08 DIAGNOSIS — M67432 Ganglion, left wrist: Secondary | ICD-10-CM | POA: Diagnosis not present

## 2022-06-08 DIAGNOSIS — L301 Dyshidrosis [pompholyx]: Secondary | ICD-10-CM

## 2022-06-08 DIAGNOSIS — I10 Essential (primary) hypertension: Secondary | ICD-10-CM

## 2022-06-08 DIAGNOSIS — F17200 Nicotine dependence, unspecified, uncomplicated: Secondary | ICD-10-CM | POA: Diagnosis not present

## 2022-06-08 DIAGNOSIS — M67431 Ganglion, right wrist: Secondary | ICD-10-CM

## 2022-06-08 NOTE — Patient Instructions (Addendum)
It was great to see you today! Thank you for choosing [facility] for your primary care. [name] Page was seen for Follow up appointment.  Today we addressed: Hypertension - Please continue to take your medications and check your blood pressure at home  Peeling - most likely something called dyshidrotic eczema, continue to use vaseline. If it gets bad you can call the clinic for steroid cream  Smoking - start using the patches and gum again and we will follow up in 2 months for a visit to discuss medications if still needed.   If you haven't already, sign up for My Chart to have easy access to your labs results, and communication with your primary care physician.  We are checking some labs today. If they are abnormal, I will call you. If they are normal, I will send you a MyChart message (if it is active) or a letter in the mail. If you do not hear about your labs in the next 2 weeks, please call the office.  You should return to our clinic  in 2 months  Please arrive 15 minutes before your appointment to ensure smooth check in process.  We appreciate your efforts in making this happen.  Thank you for allowing me to participate in your care, [name] Ram, MD 06/08/2022, 3:18 PM PGY-1, [name]  Blood Pressure Record Sheet To take your blood pressure, you will need a blood pressure machine. You can buy a blood pressure machine (blood pressure monitor) at your clinic, drug store, or online. When choosing one, consider: An automatic monitor that has an arm cuff. A cuff that wraps snugly around your upper arm. You should be able to fit only one finger between your arm and the cuff. A device that stores blood pressure reading results. Do not choose a monitor that measures your blood pressure from your wrist or finger. Follow your health care provider's instructions for how to take your blood pressure. To use this form: Take your blood pressure medications every  day These measurements should be taken when you have been at rest for at least 10-15 min Take at least 2 readings with each blood pressure check. This makes sure the results are correct. Wait 1-2 minutes between measurements. Write down the results in the spaces on this form. Keep in mind it should always be recorded systolic over diastolic. Both numbers are important.  Repeat this every day for 2-3 weeks, or as told by your health care provider.  Make a follow-up appointment with your health care provider to discuss the results.  Blood Pressure Log Date Medications taken? (Y/N) Blood Pressure Time of Day

## 2022-06-08 NOTE — Assessment & Plan Note (Signed)
Ganglion cyst on the right wrist. Resolved in the left wrist. Currently not painful.  - Counseled regarding cyst coming and going. Reviewed red flag return precautions she should call about ( more painful, bigger, hard)

## 2022-06-08 NOTE — Progress Notes (Signed)
    SUBJECTIVE:   CHIEF COMPLAINT / HPI:   Hypertension  She has been consistently taking her medications. She does not note any side effects. Every once in a while she gets a headache which is normal for her. She gets headaches when she doesn't eat as she is supposed to. No blurry vision, no syncope, dizziness, or falling.   Tobacco Cessation  She does smoke a 1/4 pack a day. She has decreased her smoking. She has tried to quit before. She quit for 2 years once. She has tried nicotine patches and gum, and lozenges. She says they helped. She says that she is weary of taking the medicines. She says she started again when people were nitpicking her.   Hand Peeling She says both of her palmar surfaces were peeling and dry. Left hand as worse initially and improved with vaseline. Her right hand is starting to improve.   Nodule on right radial wrist Has been there since February. Initially was bigger and has been decreasing in size. She just noticed it one day. She says it is not painful, sometimes itchy around it. She says there was another one on the other wrist but it went away once her hands stopped peeling.   PERTINENT  PMH / PSH: HTN, HLD, CAD  OBJECTIVE:   BP (!) 140/67   Pulse 76   Ht '5\' 7"'$  (1.702 m)   Wt 140 lb 6.4 oz (63.7 kg)   SpO2 100%   BMI 21.99 kg/m   General: well appearing sitting in chair  CV: RRR, pulses equal and palpable  Resp: Normal work of breathing, raspy voice, diminished exhalation sound  Abd: soft, non tender, non distended  Ext: No edema  Derm: peeling of the right palmar surface, dry, no desquamation, or erythema, left hand without peeling, spots of hyperpigmentation spread out  Hand: right wrist with very mobile nodule, not very hard, but not fluctuant or soft, not attached to anything   ASSESSMENT/PLAN:  Essential hypertension Assessment & Plan: Patient is well controlled. She has great accountability with taking her medications. No red flag  symptoms.  - Blood pressure log   Orders: -     Basic metabolic panel  Dyshidrotic eczema Assessment & Plan: Has peeling of both of the hands, leaving some hyperpigmentation with healing. One hand that she uses to put steroid cream in her scalp got better first. Vaseline has been helpful.  - Continue vaseline, if worsens can call the clinic for steroid cream    Smoker Assessment & Plan: Interested in smoking cessation. Not ready for smoking cessation appointment with Dr. Valentina Lucks. Does not want to try medications yet.  - Nicotine patches and gum that she has at home  - Follow up in 2 months to discuss medication.    Ganglion cyst of both wrists Assessment & Plan: Ganglion cyst on the right wrist. Resolved in the left wrist. Currently not painful.  - Counseled regarding cyst coming and going. Reviewed red flag return precautions she should call about ( more painful, bigger, hard)      Health Maintenance - Already had the flu shot this year  Recommended to return for official medicare annual wellness visit    [name], MD [place]

## 2022-06-08 NOTE — Assessment & Plan Note (Addendum)
Patient is well controlled. She has great accountability with taking her medications. No red flag symptoms.  - Blood pressure log

## 2022-06-08 NOTE — Addendum Note (Signed)
Addended by: [name] on: 06/08/2022 03:29 PM   Modules accepted: Level of Service

## 2022-06-08 NOTE — Assessment & Plan Note (Signed)
Interested in smoking cessation. Not ready for smoking cessation appointment with Dr. Valentina Lucks. Does not want to try medications yet.  - Nicotine patches and gum that she has at home  - Follow up in 2 months to discuss medication.

## 2022-06-08 NOTE — Assessment & Plan Note (Signed)
Has peeling of both of the hands, leaving some hyperpigmentation with healing. One hand that she uses to put steroid cream in her scalp got better first. Vaseline has been helpful.  - Continue vaseline, if worsens can call the clinic for steroid cream

## 2022-06-09 LAB — BASIC METABOLIC PANEL
BUN/Creatinine Ratio: 8 — ABNORMAL LOW (ref 12–28)
BUN: 6 mg/dL — ABNORMAL LOW (ref 8–27)
CO2: 26 mmol/L (ref 20–29)
Calcium: 10.1 mg/dL (ref 8.7–10.3)
Chloride: 96 mmol/L (ref 96–106)
Creatinine, Ser: 0.72 mg/dL (ref 0.57–1.00)
Glucose: 80 mg/dL (ref 70–99)
Potassium: 4.2 mmol/L (ref 3.5–5.2)
Sodium: 135 mmol/L (ref 134–144)
eGFR: 87 mL/min/{1.73_m2} (ref >59)

## 2022-06-19 ENCOUNTER — Other Ambulatory Visit: Payer: Self-pay | Admitting: Family Medicine

## 2022-06-19 DIAGNOSIS — I2510 Atherosclerotic heart disease of native coronary artery without angina pectoris: Secondary | ICD-10-CM

## 2022-06-19 DIAGNOSIS — I10 Essential (primary) hypertension: Secondary | ICD-10-CM

## 2022-07-19 ENCOUNTER — Other Ambulatory Visit: Payer: Self-pay | Admitting: Family Medicine

## 2022-07-19 DIAGNOSIS — I2510 Atherosclerotic heart disease of native coronary artery without angina pectoris: Secondary | ICD-10-CM

## 2022-07-19 NOTE — Telephone Encounter (Signed)
Unable to refill per protocol, request is too soon.  Requested Prescriptions  Pending Prescriptions Disp Refills   pravastatin (PRAVACHOL) 40 MG tablet [Pharmacy Med Name: PRAVASTATIN '40MG'$  TABLETS] 90 tablet 3    Sig: TAKE 1 TABLET(40 MG) BY MOUTH DAILY     There is no refill protocol information for this order

## 2022-07-20 NOTE — Progress Notes (Signed)
This encounter was created in error - please disregard.

## 2022-08-09 NOTE — Progress Notes (Signed)
Cardiology Office Note:    Date:  08/20/2022   ID:  [name] Page, DOB 1945-08-03, MRN [id number]  PCP:  [name], MD   [place]  Cardiologist:  None  Advanced Practice Provider:  No care team member to display Electrophysiologist:  None    Referring MD: [name], MD     History of Present Illness:    [name] Page is a 77 y.o. female with a hx of HTN, COPD, coronary artery disease with history of MI, and HLD who presents to clinic for follow-up.  The patient states that she has a history of MI in 1997 where a PCI x2. TTE 06/2020 with normal LVEF, moderate LVH, moderate MAC, aortic sclerosis, RAP 53mHg.  Was last seen in clinic on 01/2022 where she was was having intermittent palpitations but was otherwise doing well. Declined zio due to infrequent symptoms.  Today, the patient overall feels okay. She continues to have intermittent flutters which lasts a few minutes before abating. Symptoms occur once every 2-3 weeks. No associated lightheadedness, dizziness or syncope. No chest pain, SOB, orthopnea or PND. No exertional symptoms. Blood pressure well controlled in the office. Tolerating her medications.  Has been off her pravastatin as she has ran out of her medications.   Blood pressure has been mainly 120-130s at home.   Past Medical History:  Diagnosis Date   Allergic rhinitis 09/19/2006   Qualifier: Diagnosis of  By: HHenderson BaltimoreMD, Jessica     Allergy    Arthritis    Jolly tarry stools    from iron pills   CHF (congestive heart failure) ([facility] 1997   Colon polyps 2005   COPD (chronic obstructive pulmonary disease) (HCC)    GERD (gastroesophageal reflux disease)    Heart attack ([place] 1997 and 2001   Hyperlipidemia    Hypertension    Otomycosis of both ears 12/28/2019   Pneumonia 2012   Status post dilation of esophageal narrowing    TMJ dysfunction 03/27/2019    Past Surgical History:  Procedure Laterality Date   CESAREAN  SECTION  01/1976   COLONOSCOPY     CORONARY ANGIOPLASTY WITH STENT PLACEMENT  2002   POLYPECTOMY      Current Medications: Current Meds  Medication Sig   amLODipine (NORVASC) 10 MG tablet TAKE 1 TABLET(10 MG) BY MOUTH DAILY   aspirin EC 81 MG tablet Take 81 mg by mouth daily.   bisoprolol (ZEBETA) 5 MG tablet TAKE 1 TABLET(5 MG) BY MOUTH DAILY   cetirizine (ZYRTEC) 10 MG tablet TAKE 1 TABLET(10 MG) BY MOUTH DAILY   cilostazol (PLETAL) 100 MG tablet TAKE 1 TABLET(100 MG) BY MOUTH TWICE DAILY   diclofenac Sodium (VOLTAREN) 1 % GEL Apply 4 g topically 4 (four) times daily as needed.   famotidine (PEPCID) 20 MG tablet Only take when needed. Can cause drowsiness   hydrochlorothiazide (HYDRODIURIL) 25 MG tablet Take 0.5 tablets (12.5 mg total) by mouth daily.   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    Patient has close relationships with her daughter, [name] Page, and surrounding family in the area.    Patient enjoys cooking and writing poetry. 8.23.22 - pt does cook as much. Pt enjoys word searches  and reading.     Patient is separated from her husband, however they maintain a close friendship.    Social Determinants of Health   Financial Resource Strain: Low Risk  (03/14/2021)   Overall Financial Resource Strain (CARDIA)    Difficulty of Paying Living Expenses: Not hard at all  Food Insecurity: No Food Insecurity (03/14/2021)   Hunger Vital Sign    Worried About Running Out of Food in the Last Year: Never true    Ran Out of Food in the Last Year: Never true  Transportation Needs: No Transportation Needs (03/14/2021)   PRAPARE - Hydrologist (Medical): No    Lack of Transportation (Non-Medical): No  Physical Activity: Sufficiently Active (03/14/2021)   Exercise Vital Sign    Days of Exercise per Week: 4 days    Minutes of Exercise per Session: 40 min  Stress: No Stress Concern Present (03/14/2021)   [place]    Feeling of Stress : Not at all  Social Connections: Moderately Isolated (03/14/2021)   Social Connection and Isolation Panel [NHANES]    Frequency of Communication with Friends and Family: More than three times a week    Frequency of Social Gatherings with Friends and Family: Twice a week    Attends Religious Services: 1 to 4 times per year    Active Member of [employer] or Organizations: No    Attends Archivist Meetings: Never    Marital Status: Separated     Family History: The patient's family history includes Cancer in her mother; Clotting disorder in her brother; Colon cancer (age of onset: 53) in her mother; Diabetes in her sister; Esophageal cancer in her sister; Heart disease in her father. There is no history of Rectal cancer or Stomach cancer.  ROS:   Please see the history of present illness.    Review of Systems  Constitutional:  Negative for chills and fever.  HENT:  Negative for hearing loss.   Eyes:  Negative for blurred vision and redness.   Respiratory:  Negative for shortness of breath.   Cardiovascular:  Negative for chest pain, palpitations, orthopnea, claudication, leg swelling and PND.  Gastrointestinal:  Negative for nausea and vomiting.  Genitourinary:  Negative for dysuria and flank pain.  Musculoskeletal:  Positive for joint pain.  Neurological:  Negative for dizziness and loss of consciousness.  Endo/Heme/Allergies:  Negative for polydipsia.  Psychiatric/Behavioral:  Negative for substance abuse.     EKGs/Labs/Other Studies Reviewed:    The following studies were reviewed today: TTE 07/17/20:  1. Left ventricular ejection fraction, by estimation, is 60 to 65%. The  left ventricle has normal  function. The left ventricle has no regional  wall motion abnormalities. There is moderate left ventricular hypertrophy  of the basal-septal segment. Left  ventricular diastolic parameters are consistent with Grade I diastolic  dysfunction (impaired relaxation). Elevated left atrial pressure.   2. Right ventricular systolic function is normal. The right ventricular  size is normal.   3. There is mild thickening of the mitral valve leaflets. There is mild  calcification of the mitral valve leaflet. Mild to moderate mitral annular  calcification most notably on the posterior annulus. Trivial mitral valve  regurgitation.   4. The aortic valve is tricuspid. There is mild calcification of the  aortic valve. There is mild thickening of the aortic valve. Aortic valve  regurgitation is trivial. Mild aortic valve sclerosis is present, with no  evidence of aortic valve stenosis.   5. The inferior vena cava is normal in size with greater than 50%  respiratory variability, suggesting right atrial pressure of 3 mmHg.  Nuclear Stress 11/11/2001: FINDINGS  CLINICAL DATA:  CORONARY ARTERY DISEASE AND PERIPHERAL CORONARY STENT PLACEMENT.  CHEST PAIN AND  HYPERTENSION.  MYOCARDIAL PERFUSION IMAGING WITH SPECT AT REST AND STRESS, GATED  LEFT VENTRICULAR WALL MOTION  STUDY, AND EJECTION FRACTION  THE PATIENT WAS INJECTED WITH 10 MILLICURIES OF 29HBZ CARDIOLITE AT REST, AND MYOCARDIAL SPECT WAS  PERFORMED.  EXERCISE TREADMILL TESTING WAS SUBSEQUENTLY PERFORMED UNDER THE SUPERVISION OF THE  CARDIOLOGY STAFF.  THE PATIENT ACHIEVED TARGET HEART RATE, AND AT PEAK STRESS WAS INJECTED WITH 30  MILLICURIES OF 16RCV CARDIOLITE.  GATED LEFT VENTRICULAR WALL MOTION STUDY WAS PERFORMED, WITH LEFT  VENTRICULAR EJECTION FRACTION.  THE SPECT IMAGES SHOW DECREASED ACTIVITY IN THE ANTERIOR WALL ON BOTH THE STRESS AND REST IMAGES,  WITHOUT EVIDENCE OF REVERSIBILITY.  THIS IS MOST CONSISTENT WITH BREAST ATTENUATION ARTIFACT.  NO  REVERSIBLE MYOCARDIAL PERFUSION DEFECTS ARE SEEN TO SUGGEST THE PRESENCE OF  MYOCARDIAL ISCHEMIA.  THE LEFT VENTRICULAR WALL MOTION STUDY SHOWS NO EVIDENCE OF REGIONAL LEFT VENTRICULAR WALL MOTION  ABNORMALITY.  THE CALCULATED LEFT VENTRICULAR EJECTION FRACTION IS 53%.  IMPRESSION  1.  NO EVIDENCE OF EXERCISE-INDUCED MYOCARDIAL ISCHEMIA.  2.  CALCULATED LEFT VENTRICULAR EJECTION FRACTION OF 53%.  EKG:  No new tracing today  Recent Labs: 01/19/2022: ALT 12; Magnesium 2.1 06/08/2022: BUN 6; Creatinine, Ser 0.72; Potassium 4.2; Sodium 135  Recent Lipid Panel    Component Value Date/Time   CHOL 155 01/19/2022 1606   TRIG 170 (H) 01/19/2022 1606   HDL 57 01/19/2022 1606   CHOLHDL 2.7 01/19/2022 1606   CHOLHDL 4.0 11/07/2015 1604   VLDL 49 (H) 11/07/2015 1604   LDLCALC 70 01/19/2022 1606      Physical Exam:    VS:  BP 134/76   Pulse 79   Ht 5' 7"  (1.702 m)   Wt 141 lb (64 kg)   SpO2 95%   BMI 22.08 kg/m     Wt Readings from Last 3 Encounters:  08/20/22 141 lb (64 kg)  07/18/22 140 lb (63.5 kg)  06/08/22 140 lb 6.4 oz (63.7 kg)     GEN:  Comfortable, NAD HEENT: Normal NECK: No JVD; No carotid bruits CARDIAC: RRR, 2/6 systolic murmur at RUSB RESPIRATORY:  CTAB ABDOMEN: Soft, non-tender,  non-distended MUSCULOSKELETAL:  No edema; No deformity  SKIN: Warm and dry NEUROLOGIC:  Alert and oriented x 3 PSYCHIATRIC:  Normal affect   ASSESSMENT:    1. Coronary artery disease involving native coronary artery of native heart without angina pectoris  2. Essential hypertension   3. HYPERCHOLESTEROLEMIA   4. Palpitations   5. Atherosclerosis of coronary artery of native heart without angina pectoris, unspecified vessel or lesion type     PLAN:    In order of problems listed above:  #Coronary Artery Disease with Remote MI: Doing well without anginal symptoms. TTE with normal LVEF 60-65%, moderate LVH, mild-to-moderate MAC, mild sclerosis.  -Continue ASA 71m daily -Continue prava 456mdaily -Losartan 10087maily -Bisoprolol 5mg58mily  #Palpitations: Occurring infrequently and not sustaining but given persistence of symptoms since last visit, patient would like to pursue cardiac monitor at this time. -Check 2 week zio -Continue bisoprolol 5mg 60mly  #HTN: Controlled in the office. Discussed proper BP taking techniques with home cuff.   -Continue Losartan 100mg 46my -Continue Bisoprolol 5mg da40m -Continue HCTZ 12.5mg dai64m-Continue Amlodipine 10mg dai27mContinue spiro 25mg dail26mow Na diet  #HLD: -Continue pravastatin 40mg daily3mL well controlled 70 -Continue lifestyle modifications  #Systolic Murmur: TTE with aortic sclerosis in 2021. Will continue to monitor.    Medication Adjustments/Labs and Tests Ordered: Current medicines are reviewed at length with the patient today.  Concerns regarding medicines are outlined above.  Orders Placed This Encounter  Procedures   LONG TERM MONITOR (3-14 DAYS)   Meds ordered this encounter  Medications   pravastatin (PRAVACHOL) 40 MG tablet    Sig: TAKE 1 TABLET(40 MG) BY MOUTH DAILY    Dispense:  90 tablet    Refill:  3   losartan (COZAAR) 100 MG tablet    Sig: TAKE 1 TABLET(100 MG) BY MOUTH DAILY     Dispense:  90 tablet    Refill:  3    Patient Instructions  Medication Instructions:   Your physician recommends that you continue on your current medications as directed. Please refer to the Current Medication list given to you today.  *If you need a refill on your cardiac medications before your next appointment, please call your pharmacy*   Testing/Procedures:  ZIO XT- LonElbastructions  Your physician has requested you wear a ZIO patch monitor for 14 days.  This is a single patch monitor. [employer] supplies one patch monitor per enrollment. Additional stickers are not available. Please do not apply patch if you will be having a Nuclear Stress Test,  Echocardiogram, Cardiac CT, MRI, or Chest Xray during the period you would be wearing the  monitor. The patch cannot be worn during these tests. You cannot remove and re-apply the  ZIO XT patch monitor.  Your ZIO patch monitor will be mailed 3 day USPS to your address on file. It may take 3-5 days  to receive your monitor after you have been enrolled.  Once you have received your monitor, please review the enclosed instructions. Your monitor  has already been registered assigning a specific monitor serial # to you.  Billing and Patient Assistance Program Information  We have supplied [employer] with any of your insurance information on file for billing purposes. [employer] offers a sliding scale Patient Assistance Program for patients that do not have  insurance, or whose insurance does not completely cover the cost of the ZIO monitor.  You must apply for the Patient Assistance Program to qualify for this discounted rate.  To apply, please call [employer] at [phone] 4, select option 2, ask to apply for  Patient Assistance Program. [employer] wilTheodore Demarkour household income, and how many people  are in your household. They will quote your  out-of-pocket cost based on that information.  [employer] will also be able to  set up a 73-month interest-free payment plan if needed.  Applying the monitor   Shave hair from upper left chest.  Hold abrader disc by orange tab. Rub abrader in 40 strokes over the upper left chest as  indicated in your monitor instructions.  Clean area with 4 enclosed alcohol pads. Let dry.  Apply patch as indicated in monitor instructions. Patch will be placed under collarbone on left  side of chest with arrow pointing upward.  Rub patch adhesive wings for 2 minutes. Remove white label marked "1". Remove the white  label marked "2". Rub patch adhesive wings for 2 additional minutes.  While looking in a mirror, press and release button in center of patch. A small green light will  flash 3-4 times. This will be your only indicator that the monitor has been turned on.  Do not shower for the first 24 hours. You may shower after the first 24 hours.  Press the button if you feel a symptom. You will hear a small click. Record Date, Time and  Symptom in the Patient Logbook.  When you are ready to remove the patch, follow instructions on the last 2 pages of Patient  Logbook. Stick patch monitor onto the last page of Patient Logbook.  Place Patient Logbook in the blue and white box. Use locking tab on box and tape box closed  securely. The blue and white box has prepaid postage on it. Please place it in the mailbox as  soon as possible. Your physician should have your test results approximately 7 days after the  monitor has been mailed back to [facility]  Call [place] [phone] you have questions regarding  your ZIO XT patch monitor. Call them immediately if you see an orange light blinking on your  monitor.  If your monitor falls off in less than 4 days, contact our Monitor department at [phone]  If your monitor becomes loose or falls off after 4 days call [employer] at [phone]  suggestions on securing your monitor    Follow-Up: [facility] and your [facility] needs are our priority.  As part of our continuing mission to provide you with exceptional heart care, we have created designated Provider Care Teams.  These Care Teams include your primary Cardiologist (physician) and Advanced Practice Providers (APPs -  Physician Assistants and Nurse Practitioners) who all work together to provide you with the care you need, when you need it.  We recommend signing up for the patient portal called "MyChart".  Sign up information is provided on this After Visit Summary.  MyChart is used to connect with patients for Virtual Visits (Telemedicine).  Patients are able to view lab/test results, encounter notes, upcoming appointments, etc.  Non-urgent messages can be sent to your provider as well.   To learn more about what you can do with MyChart, go to [email or web address]    Your next appointment:   6 month(s)  Provider:   DR. PJohney Frame     Signed, HFreada Bergeron MD  08/20/2022 10:40 AM    CFort Totten

## 2022-08-20 ENCOUNTER — Ambulatory Visit: Payer: Medicare Other | Attending: Cardiology

## 2022-08-20 ENCOUNTER — Ambulatory Visit: Payer: Medicare Other | Attending: Cardiology | Admitting: Cardiology

## 2022-08-20 ENCOUNTER — Encounter: Payer: Self-pay | Admitting: Cardiology

## 2022-08-20 ENCOUNTER — Telehealth: Payer: Self-pay | Admitting: *Deleted

## 2022-08-20 VITALS — BP 134/76 | HR 79 | Ht 67.0 in | Wt 141.0 lb

## 2022-08-20 DIAGNOSIS — I10 Essential (primary) hypertension: Secondary | ICD-10-CM

## 2022-08-20 DIAGNOSIS — E7800 Pure hypercholesterolemia, unspecified: Secondary | ICD-10-CM

## 2022-08-20 DIAGNOSIS — R002 Palpitations: Secondary | ICD-10-CM

## 2022-08-20 DIAGNOSIS — I2510 Atherosclerotic heart disease of native coronary artery without angina pectoris: Secondary | ICD-10-CM

## 2022-08-20 MED ORDER — LOSARTAN POTASSIUM 100 MG PO TABS: ORAL_TABLET | ORAL | 3 refills | Status: DC

## 2022-08-20 MED ORDER — PRAVASTATIN SODIUM 40 MG PO TABS: ORAL_TABLET | ORAL | 3 refills | Status: DC

## 2022-08-20 NOTE — Patient Instructions (Signed)
Medication Instructions:   Your physician recommends that you continue on your current medications as directed. Please refer to the Current Medication list given to you today.  *If you need a refill on your cardiac medications before your next appointment, please call your pharmacy*   Testing/Procedures:  [place] Monitor Instructions  Your physician has requested you wear a ZIO patch monitor for 14 days.  This is a single patch monitor. [employer] supplies one patch monitor per enrollment. Additional stickers are not available. Please do not apply patch if you will be having a Nuclear Stress Test,  Echocardiogram, Cardiac CT, MRI, or Chest Xray during the period you would be wearing the  monitor. The patch cannot be worn during these tests. You cannot remove and re-apply the  ZIO XT patch monitor.  Your ZIO patch monitor will be mailed 3 day USPS to your address on file. It may take 3-5 days  to receive your monitor after you have been enrolled.  Once you have received your monitor, please review the enclosed instructions. Your monitor  has already been registered assigning a specific monitor serial # to you.  Billing and Patient Assistance Program Information  We have supplied [employer] with any of your insurance information on file for billing purposes. [employer] offers a sliding scale Patient Assistance Program for patients that do not have  insurance, or whose insurance does not completely cover the cost of the ZIO monitor.  You must apply for the Patient Assistance Program to qualify for this discounted rate.  To apply, please call [employer] at [phone], select option 4, select option 2, ask to apply for  Patient Assistance Program. Theodore Demark will ask your household income, and how many people  are in your household. They will quote your out-of-pocket cost based on that information.  [employer] will also be able to set up a 67-month interest-free payment plan if  needed.  Applying the monitor   Shave hair from upper left chest.  Hold abrader disc by orange tab. Rub abrader in 40 strokes over the upper left chest as  indicated in your monitor instructions.  Clean area with 4 enclosed alcohol pads. Let dry.  Apply patch as indicated in monitor instructions. Patch will be placed under collarbone on left  side of chest with arrow pointing upward.  Rub patch adhesive wings for 2 minutes. Remove white label marked "1". Remove the white  label marked "2". Rub patch adhesive wings for 2 additional minutes.  While looking in a mirror, press and release button in center of patch. A small green light will  flash 3-4 times. This will be your only indicator that the monitor has been turned on.  Do not shower for the first 24 hours. You may shower after the first 24 hours.  Press the button if you feel a symptom. You will hear a small click. Record Date, Time and  Symptom in the Patient Logbook.  When you are ready to remove the patch, follow instructions on the last 2 pages of Patient  Logbook. Stick patch monitor onto the last page of Patient Logbook.  Place Patient Logbook in the blue and white box. Use locking tab on box and tape box closed  securely. The blue and white box has prepaid postage on it. Please place it in the mailbox as  soon as possible. Your physician should have your test results approximately 7 days after the  monitor has been mailed back to [facility]  Call [employer]  Customer Care at [phone] if you have questions regarding  your ZIO XT patch monitor. Call them immediately if you see an orange light blinking on your  monitor.  If your monitor falls off in less than 4 days, contact our Monitor department at [phone].  If your monitor becomes loose or falls off after 4 days call [employer] at [phone] for  suggestions on securing your monitor    Follow-Up: At [facility], you and your health needs are  our priority.  As part of our continuing mission to provide you with exceptional heart care, we have created designated Provider Care Teams.  These Care Teams include your primary Cardiologist (physician) and Advanced Practice Providers (APPs -  Physician Assistants and Nurse Practitioners) who all work together to provide you with the care you need, when you need it.  We recommend signing up for the patient portal called "MyChart".  Sign up information is provided on this After Visit Summary.  MyChart is used to connect with patients for Virtual Visits (Telemedicine).  Patients are able to view lab/test results, encounter notes, upcoming appointments, etc.  Non-urgent messages can be sent to your provider as well.   To learn more about what you can do with MyChart, go to [email or web address].    Your next appointment:   6 month(s)  Provider:   DR. Johney Frame

## 2022-08-20 NOTE — Telephone Encounter (Signed)
-----  Message from Jennefer Bravo sent at 08/20/2022 11:28 AM EST ----- Regarding: RE: 2 WEEK ZIO PER DR. Johney Frame done ----- Message ----- From: Nuala Alpha, LPN Sent: 08/10/8675  10:39 AM EST To: Nuala Alpha, LPN; Shelly A Wells; # Subject: 2 WEEK ZIO PER DR. Johney Frame                   2 WEEK zio ordered for palpitations  Please enroll and shoot me the date thereafter?  Thanks [employer]

## 2022-08-20 NOTE — Progress Notes (Unsigned)
Enrolled for [employer] to mail a ZIO XT long term holter monitor to the patients address on file.  

## 2022-08-23 DIAGNOSIS — R002 Palpitations: Secondary | ICD-10-CM | POA: Diagnosis not present

## 2022-09-12 ENCOUNTER — Other Ambulatory Visit: Payer: Self-pay | Admitting: Family Medicine

## 2022-09-17 DIAGNOSIS — R002 Palpitations: Secondary | ICD-10-CM | POA: Diagnosis not present

## 2022-09-26 ENCOUNTER — Telehealth: Payer: Self-pay | Admitting: *Deleted

## 2022-09-26 DIAGNOSIS — I498 Other specified cardiac arrhythmias: Secondary | ICD-10-CM

## 2022-09-26 DIAGNOSIS — I471 Supraventricular tachycardia, unspecified: Secondary | ICD-10-CM

## 2022-09-26 DIAGNOSIS — R008 Other abnormalities of heart beat: Secondary | ICD-10-CM

## 2022-09-26 DIAGNOSIS — R002 Palpitations: Secondary | ICD-10-CM

## 2022-09-26 DIAGNOSIS — R9431 Abnormal electrocardiogram [ECG] [EKG]: Secondary | ICD-10-CM

## 2022-09-26 MED ORDER — BISOPROLOL FUMARATE 10 MG PO TABS: 10.0000 mg | ORAL_TABLET | Freq: Every day | ORAL | 1 refills | Status: DC

## 2022-09-26 NOTE — Telephone Encounter (Signed)
-----   Message from Freada Bergeron, MD sent at 09/25/2022  8:43 PM EST ----- Her heart monitor shows a couple runs of extra beats from the top chamber and the bottom chamber of her heart. These are not harmful to her as they do not last long but can cause palpitations. We can increase her bisoprolol to '10mg'$  daily if her palpitations are persisting. Can we also just repeat her TTE for further evaluation just due to the extra beats from the bottom chamber?

## 2022-09-26 NOTE — Telephone Encounter (Signed)
The patient has been notified of the result and verbalized understanding.  All questions (if any) were answered.  Pt states she does feel the palpitations and is interested in increasing her bisoprolol to the 10 mg dose to see if that helps.  Pt aware we will increase her bisoprolol to 10 mg po daily.  She is also aware we will order for her to get a repeat echo done to further evaluate her extra beats from the bottom chamber of her heart.   She is aware that I will place the order for the echo in the system and have our Echo Scheduler call her back to arrange for this appt to be done sometime soon.  Confirmed the pharmacy of choice with the pt.   Pt verbalized understanding and agrees with this plan.  Will make Dr. Johney Frame aware that pt will increase her medication and will get an echo done.

## 2022-10-04 ENCOUNTER — Other Ambulatory Visit: Payer: Self-pay | Admitting: Family Medicine

## 2022-10-09 ENCOUNTER — Ambulatory Visit (HOSPITAL_COMMUNITY): Payer: Medicare Other | Attending: Cardiology

## 2022-10-09 DIAGNOSIS — R9431 Abnormal electrocardiogram [ECG] [EKG]: Secondary | ICD-10-CM | POA: Diagnosis not present

## 2022-10-09 DIAGNOSIS — I498 Other specified cardiac arrhythmias: Secondary | ICD-10-CM | POA: Diagnosis not present

## 2022-10-09 DIAGNOSIS — R008 Other abnormalities of heart beat: Secondary | ICD-10-CM | POA: Insufficient documentation

## 2022-10-09 DIAGNOSIS — R002 Palpitations: Secondary | ICD-10-CM | POA: Diagnosis not present

## 2022-10-09 DIAGNOSIS — I471 Supraventricular tachycardia, unspecified: Secondary | ICD-10-CM | POA: Diagnosis not present

## 2022-10-09 LAB — ECHOCARDIOGRAM COMPLETE
Area-P 1/2: 3.26 cm2
P 1/2 time: 573 msec
S' Lateral: 2.3 cm

## 2022-10-10 ENCOUNTER — Other Ambulatory Visit: Payer: Self-pay | Admitting: Family Medicine

## 2022-10-10 ENCOUNTER — Telehealth: Payer: Self-pay | Admitting: Cardiology

## 2022-10-10 NOTE — Telephone Encounter (Signed)
The patient has been notified of the result and verbalized understanding.  All questions (if any) were answered. Nuala Alpha, LPN [id number] AM

## 2022-10-10 NOTE — Telephone Encounter (Signed)
-----   Message from Freada Bergeron, MD sent at 10/10/2022  9:24 AM EDT ----- Her echo shows normal pumping function. There is very mild leakiness of her aortic valve which is not harmful to her and will not cause symptoms. Overall, this is great news and stable from her prior echo.

## 2022-10-10 NOTE — Telephone Encounter (Signed)
Pt is following up on Echo results

## 2022-10-12 NOTE — Telephone Encounter (Signed)
Patient returns call to nurse line regarding refill.   Talbot Grumbling, RN

## 2022-10-31 ENCOUNTER — Other Ambulatory Visit: Payer: Self-pay | Admitting: Family Medicine

## 2022-10-31 DIAGNOSIS — I10 Essential (primary) hypertension: Secondary | ICD-10-CM

## 2022-12-28 ENCOUNTER — Other Ambulatory Visit: Payer: Self-pay | Admitting: Family Medicine

## 2022-12-28 DIAGNOSIS — I10 Essential (primary) hypertension: Secondary | ICD-10-CM

## 2023-01-07 ENCOUNTER — Other Ambulatory Visit: Payer: Self-pay

## 2023-01-07 ENCOUNTER — Ambulatory Visit (INDEPENDENT_AMBULATORY_CARE_PROVIDER_SITE_OTHER): Payer: Medicare Other | Admitting: Family Medicine

## 2023-01-07 ENCOUNTER — Encounter: Payer: Self-pay | Admitting: Family Medicine

## 2023-01-07 VITALS — BP 134/72 | HR 64 | Ht 67.0 in | Wt 146.0 lb

## 2023-01-07 DIAGNOSIS — I10 Essential (primary) hypertension: Secondary | ICD-10-CM

## 2023-01-07 DIAGNOSIS — L301 Dyshidrosis [pompholyx]: Secondary | ICD-10-CM | POA: Diagnosis not present

## 2023-01-07 DIAGNOSIS — F17200 Nicotine dependence, unspecified, uncomplicated: Secondary | ICD-10-CM | POA: Diagnosis not present

## 2023-01-07 MED ORDER — HYDROCHLOROTHIAZIDE 12.5 MG PO TABS: 12.5000 mg | ORAL_TABLET | Freq: Every day | ORAL | 1 refills | Status: DC

## 2023-01-07 NOTE — Assessment & Plan Note (Signed)
Controlled on current medications 
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
## 2023-01-07 NOTE — Patient Instructions (Addendum)
Dyshidrotic eczema - Use a lotion, THEN put on the vaseline.   For tobacco use  - You can try the gum, and reading your bible whenever you feel like smoking   Medicare Annual Wellness Exam Your chart indicates that you are due for your Medicare annual wellness exam.  Your insurance likes [place] to do one of these every year.  This is a nurse only visit that takes about 30 minutes to an hour.  It can be done either in person or virtually.  This is an in-depth visit that focuses on preventative care and keeping you healthy.  Somebody from our clinic will be calling you soon to get this scheduled.   You are due for a shingles vaccine. You can get this at your local pharmacy

## 2023-01-07 NOTE — Assessment & Plan Note (Signed)
Continues to have dyshidrotic eczema. Counseled regarding use of moisturizer without fragrance and non fragranced soaps.  - Can use cetirizine if very itchy.

## 2023-01-07 NOTE — Assessment & Plan Note (Signed)
Patient has significantly reduced number of cigarettes smoked and goes weekly stints without any cigarettes used.  - Methods to reduce cigarette use: nicotine gum, reading bible, word search and listening to music  - F/u in 3 months

## 2023-01-08 ENCOUNTER — Telehealth: Payer: Self-pay | Admitting: Family Medicine

## 2023-01-08 LAB — BASIC METABOLIC PANEL
BUN/Creatinine Ratio: 13 (ref 12–28)
BUN: 9 mg/dL (ref 8–27)
CO2: 27 mmol/L (ref 20–29)
Calcium: 9.8 mg/dL (ref 8.7–10.3)
Chloride: 94 mmol/L — ABNORMAL LOW (ref 96–106)
Creatinine, Ser: 0.72 mg/dL (ref 0.57–1.00)
Glucose: 84 mg/dL (ref 70–99)
Potassium: 4 mmol/L (ref 3.5–5.2)
Sodium: 134 mmol/L (ref 134–144)
eGFR: 87 mL/min/{1.73_m2} (ref >59)

## 2023-01-08 NOTE — Telephone Encounter (Signed)
Called patient to inform of normal lab results. Patient did not answer phone.

## 2023-01-14 ENCOUNTER — Ambulatory Visit (INDEPENDENT_AMBULATORY_CARE_PROVIDER_SITE_OTHER): Payer: Medicare Other

## 2023-01-14 VITALS — Ht 67.0 in | Wt 146.0 lb

## 2023-01-14 DIAGNOSIS — Z0000 Encounter for general adult medical examination without abnormal findings: Secondary | ICD-10-CM

## 2023-01-14 NOTE — Patient Instructions (Signed)
[name] Page , Thank you for taking time to come for your Medicare Wellness Visit. I appreciate your ongoing commitment to your health goals. Please review the following plan we discussed and let me know if I can assist you in the future.   These are the goals we discussed:  Goals      Blood Pressure < 140/90     Quit smoking / using tobacco     Patient has been smoking more due to the stress of COVID and a recent move.      Remain active and independent        This is a list of the screening recommended for you and due dates:  Health Maintenance  Topic Date Due   Zoster (Shingles) Vaccine (1 of 2) Never done   COVID-19 Vaccine (4 - 2023-24 season) 03/23/2022   Flu Shot  02/21/2023   Medicare Annual Wellness Visit  01/14/2024   DTaP/Tdap/Td vaccine (3 - Td or Tdap) 12/29/2029   Pneumonia Vaccine  Completed   DEXA scan (bone density measurement)  Completed   Hepatitis C Screening  Completed   HPV Vaccine  Aged Out   Colon Cancer Screening  Discontinued    Advanced directives: Information on Advanced Care Planning can be found at [facility] Advance Health Care Directives Advance Health Care Directives ([email or web address] Please bring a copy of your health care power of attorney and living will to the office to be added to your chart at your convenience.  Conditions/risks identified: Aim for 30 minutes of exercise or brisk walking, 6-8 glasses of water, and 5 servings of fruits and vegetables each day.  Next appointment: Follow up in one year for your annual wellness visit    Preventive Care 65 Years and Older, Female Preventive care refers to lifestyle choices and visits with your health care provider that can promote health and wellness. What does preventive care include? A yearly physical exam. This is also called an annual well check. Dental exams once or twice a year. Routine eye exams. Ask your health care provider how often you should have your eyes  checked. Personal lifestyle choices, including: Daily care of your teeth and gums. Regular physical activity. Eating a healthy diet. Avoiding tobacco and drug use. Limiting alcohol use. Practicing safe sex. Taking low-dose aspirin every day. Taking vitamin and mineral supplements as recommended by your health care provider. What happens during an annual well check? The services and screenings done by your health care provider during your annual well check will depend on your age, overall health, lifestyle risk factors, and family history of disease. Counseling  Your health care provider may ask you questions about your: Alcohol use. Tobacco use. Drug use. Emotional well-being. Home and relationship well-being. Sexual activity. Eating habits. History of falls. Memory and ability to understand (cognition). Work and work Astronomer. Reproductive health. Screening  You may have the following tests or measurements: Height, weight, and BMI. Blood pressure. Lipid and cholesterol levels. These may be checked every 5 years, or more frequently if you are over 8 years old. Skin check. Lung cancer screening. You may have this screening every year starting at age 79 if you have a 30-pack-year history of smoking and currently smoke or have quit within the past 15 years. Fecal occult blood test (FOBT) of the stool. You may have this test every year starting at age 17. Flexible sigmoidoscopy or colonoscopy. You may have a sigmoidoscopy every 5 years or a colonoscopy every  10 years starting at age 38. Hepatitis C blood test. Hepatitis B blood test. Sexually transmitted disease (STD) testing. Diabetes screening. This is done by checking your blood sugar (glucose) after you have not eaten for a while (fasting). You may have this done every 1-3 years. Bone density scan. This is done to screen for osteoporosis. You may have this done starting at age 85. Mammogram. This may be done every 1-2  years. Talk to your health care provider about how often you should have regular mammograms. Talk with your health care provider about your test results, treatment options, and if necessary, the need for more tests. Vaccines  Your health care provider may recommend certain vaccines, such as: Influenza vaccine. This is recommended every year. Tetanus, diphtheria, and acellular pertussis (Tdap, Td) vaccine. You may need a Td booster every 10 years. Zoster vaccine. You may need this after age 36. Pneumococcal 13-valent conjugate (PCV13) vaccine. One dose is recommended after age 41. Pneumococcal polysaccharide (PPSV23) vaccine. One dose is recommended after age 67. Talk to your health care provider about which screenings and vaccines you need and how often you need them. This information is not intended to replace advice given to you by your health care provider. Make sure you discuss any questions you have with your health care provider. Document Released: 08/05/2015 Document Revised: 03/28/2016 Document Reviewed: 05/10/2015 Elsevier Interactive Patient Education  2017 [place] Prevention in the Home Falls can cause injuries. They can happen to people of all ages. There are many things you can do to make your home safe and to help prevent falls. What can I do on the outside of my home? Regularly fix the edges of walkways and driveways and fix any cracks. Remove anything that might make you trip as you walk through a door, such as a raised step or threshold. Trim any bushes or trees on the path to your home. Use bright outdoor lighting. Clear any walking paths of anything that might make someone trip, such as rocks or tools. Regularly check to see if handrails are loose or broken. Make sure that both sides of any steps have handrails. Any raised decks and porches should have guardrails on the edges. Have any leaves, snow, or ice cleared regularly. Use sand or salt on walking paths  during winter. Clean up any spills in your garage right away. This includes oil or grease spills. What can I do in the bathroom? Use night lights. Install grab bars by the toilet and in the tub and shower. Do not use towel bars as grab bars. Use non-skid mats or decals in the tub or shower. If you need to sit down in the shower, use a plastic, non-slip stool. Keep the floor dry. Clean up any water that spills on the floor as soon as it happens. Remove soap buildup in the tub or shower regularly. Attach bath mats securely with double-sided non-slip rug tape. Do not have throw rugs and other things on the floor that can make you trip. What can I do in the bedroom? Use night lights. Make sure that you have a light by your bed that is easy to reach. Do not use any sheets or blankets that are too big for your bed. They should not hang down onto the floor. Have a firm chair that has side arms. You can use this for support while you get dressed. Do not have throw rugs and other things on the floor that can make you  trip. What can I do in the kitchen? Clean up any spills right away. Avoid walking on wet floors. Keep items that you use a lot in easy-to-reach places. If you need to reach something above you, use a strong step stool that has a grab bar. Keep electrical cords out of the way. Do not use floor polish or wax that makes floors slippery. If you must use wax, use non-skid floor wax. Do not have throw rugs and other things on the floor that can make you trip. What can I do with my stairs? Do not leave any items on the stairs. Make sure that there are handrails on both sides of the stairs and use them. Fix handrails that are broken or loose. Make sure that handrails are as long as the stairways. Check any carpeting to make sure that it is firmly attached to the stairs. Fix any carpet that is loose or worn. Avoid having throw rugs at the top or bottom of the stairs. If you do have throw  rugs, attach them to the floor with carpet tape. Make sure that you have a light switch at the top of the stairs and the bottom of the stairs. If you do not have them, ask someone to add them for you. What else can I do to help prevent falls? Wear shoes that: Do not have high heels. Have rubber bottoms. Are comfortable and fit you well. Are closed at the toe. Do not wear sandals. If you use a stepladder: Make sure that it is fully opened. Do not climb a closed stepladder. Make sure that both sides of the stepladder are locked into place. Ask someone to hold it for you, if possible. Clearly mark and make sure that you can see: Any grab bars or handrails. First and last steps. Where the edge of each step is. Use tools that help you move around (mobility aids) if they are needed. These include: Canes. Walkers. Scooters. Crutches. Turn on the lights when you go into a dark area. Replace any light bulbs as soon as they burn out. Set up your furniture so you have a clear path. Avoid moving your furniture around. If any of your floors are uneven, fix them. If there are any pets around you, be aware of where they are. Review your medicines with your doctor. Some medicines can make you feel dizzy. This can increase your chance of falling. Ask your doctor what other things that you can do to help prevent falls. This information is not intended to replace advice given to you by your health care provider. Make sure you discuss any questions you have with your health care provider. Document Released: 05/05/2009 Document Revised: 12/15/2015 Document Reviewed: 08/13/2014 Elsevier Interactive Patient Education  2017 [employer].

## 2023-01-14 NOTE — Progress Notes (Addendum)
Subjective:   [name] Page is a 77 y.o. female who presents for Medicare Annual (Subsequent) preventive examination.  Visit Complete: Virtual  I connected with  Ary Rudnick Mangiapane on 01/14/23 by a audio enabled telemedicine application and verified that I am speaking with the correct person using two identifiers.  Patient Location: Home  Provider Location: Home Office  I discussed the limitations of evaluation and management by telemedicine. The patient expressed understanding and agreed to proceed.  Review of Systems     Cardiac Risk Factors include: advanced age (>56men, >76 women);hypertension;smoking/ tobacco exposure     Objective:    Today's Vitals   01/14/23 1048  Weight: 146 lb (66.2 kg)  Height: 5\' 7"  (1.702 m)   Body mass index is 22.87 kg/m.     01/14/2023    4:11 PM 01/07/2023    1:50 PM 06/08/2022    2:04 PM 01/19/2022    3:16 PM 03/14/2021    7:00 PM 03/13/2021    2:23 PM 02/15/2021    1:28 PM  Advanced Directives  Does Patient Have a Medical Advance Directive? No No No No No No No  Would patient like information on creating a medical advance directive? Yes (MAU/Ambulatory/Procedural Areas - Information given) No - Patient declined No - Patient declined  No - Patient declined No - Patient declined No - Patient declined    Current Medications (verified) Outpatient Encounter Medications as of 01/14/2023  Medication Sig   amLODipine (NORVASC) 10 MG tablet TAKE 1 TABLET(10 MG) BY MOUTH DAILY   aspirin EC 81 MG tablet Take 81 mg by mouth daily.   bisoprolol (ZEBETA) 5 MG tablet TAKE 1 TABLET(5 MG) BY MOUTH DAILY   cetirizine (ZYRTEC) 10 MG tablet TAKE 1 TABLET(10 MG) BY MOUTH DAILY   cilostazol (PLETAL) 100 MG tablet TAKE 1 TABLET(100 MG) BY MOUTH TWICE DAILY   diclofenac Sodium (VOLTAREN) 1 % GEL Apply 4 g topically 4 (four) times daily as needed.   famotidine (PEPCID) 20 MG tablet Only take when needed. Can cause drowsiness   hydrochlorothiazide  (HYDRODIURIL) 12.5 MG tablet Take 1 tablet (12.5 mg total) by mouth daily.   losartan (COZAAR) 100 MG tablet TAKE 1 TABLET(100 MG) BY MOUTH DAILY   pravastatin (PRAVACHOL) 40 MG tablet TAKE 1 TABLET(40 MG) BY MOUTH DAILY   No facility-administered encounter medications on file as of 01/14/2023.    Allergies (verified) Lisinopril and Penicillins   History: Past Medical History:  Diagnosis Date   Allergic rhinitis 09/19/2006   Qualifier: Diagnosis of  By: Luz Brazen MD, Jessica     Allergy    Arthritis    Lobo tarry stools    from iron pills   CHF (congestive heart failure) (HCC) 1997   Colon polyps 2005   COPD (chronic obstructive pulmonary disease) (HCC)    GERD (gastroesophageal reflux disease)    Heart attack (HCC) 1997 and 2001   Hyperlipidemia    Hypertension    Otomycosis of both ears 12/28/2019   Pneumonia 2012   Status post dilation of esophageal narrowing    TMJ dysfunction 03/27/2019   Past Surgical History:  Procedure Laterality Date   CESAREAN SECTION  01/1976   COLONOSCOPY     CORONARY ANGIOPLASTY WITH STENT PLACEMENT  2002   POLYPECTOMY     Family History  Problem Relation Age of Onset   Colon cancer Mother 45       EARLY 65'S   Cancer Mother  colon   Heart disease Father    Diabetes Sister    Esophageal cancer Sister    Clotting disorder Brother    Rectal cancer Neg Hx    Stomach cancer Neg Hx    Social History   Socioeconomic History   Marital status: Legally Separated    Spouse name: Ellora Varnum   Number of children: 2   Years of education: 14   Highest education level: Some college, no degree  Occupational History   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    Patient has close relationships with her daughter, Sherrilyn Rist, and surrounding family in the area.    Patient enjoys cooking and writing poetry. 8.23.22 - pt does cook as much. Pt enjoys word searches and reading.     Patient is separated from her husband, however they maintain a close friendship.    Social Determinants of Health   Financial Resource Strain: Low Risk  (01/14/2023)   Overall Financial Resource Strain (CARDIA)    Difficulty of Paying Living Expenses: Not hard at all  Food Insecurity: No Food Insecurity (01/14/2023)   Hunger Vital Sign    Worried About Running Out of Food in the Last Year: Never true    Ran Out of Food in the Last Year: Never true  Transportation Needs: No Transportation Needs (01/14/2023)   PRAPARE - Administrator, Civil Service (Medical): No    Lack of Transportation (Non-Medical): No  Physical Activity: Sufficiently Active (01/14/2023)   Exercise Vital Sign    Days of Exercise per Week: 5 days    Minutes of Exercise per Session: 30 min  Stress: No Stress Concern Present (01/14/2023)   [employer] of Occupational Health - Occupational Stress Questionnaire    Feeling of Stress : Not at all  Social Connections: Moderately Isolated (01/14/2023)   Social Connection and Isolation Panel [NHANES]    Frequency of Communication with Friends and Family: More than three times a week    Frequency of Social Gatherings with Friends and Family: Twice a week    Attends Religious Services: More than 4 times per year    Active Member of [employer] or Organizations: No    Attends Engineer, structural: Never    Marital Status: Separated    Tobacco Counseling Ready to quit: Not Answered Counseling given: Not Answered Tobacco comments: working on  quitting   Clinical Intake:  Pre-visit preparation completed: Yes  Pain : No/denies pain  Diabetes: No  How often do you need to have someone help you when you read instructions, pamphlets, or other written materials from your doctor or pharmacy?: 1 - Never  Interpreter Needed?: No  Information entered by :: Kandis Fantasia LPN   Activities of Daily Living    01/14/2023   10:52 AM  In your present state of health, do you have any difficulty performing the  following activities:  Hearing? 0  Vision? 0  Difficulty concentrating or making decisions? 0  Walking or climbing stairs? 0  Dressing or bathing? 0  Doing errands, shopping? 0  Preparing Food and eating ? N  Using the Toilet? N  In the past six months, have you accidently leaked urine? N  Do you have problems with loss of bowel control? N  Managing your Medications? N  Managing your Finances? N  Housekeeping or managing your Housekeeping? N    Patient Care Team: Lockie Mola, MD as PCP - General (Family Medicine) Clarene Duke Thereasa Solo, MD as Attending Physician (Cardiology)  Indicate any recent Medical Services you may have received from other than [facility] providers in the past year (date may be approximate).     Assessment:   This is a routine wellness examination for [place].  Hearing/Vision screen Hearing Screening - Comments:: Denies hearing difficulties   Vision Screening - Comments:: No vision problems; will schedule routine eye exam soon    Dietary issues and exercise activities discussed:     Goals Addressed             This Visit's Progress    Remain active and independent        Depression Screen    01/14/2023   10:51 AM 06/08/2022    2:04 PM 01/19/2022    3:18 PM 03/14/2021    6:56 PM 03/13/2021    2:25 PM 02/15/2021    1:27 PM 11/23/2020   10:00 AM  PHQ 2/9 Scores  PHQ - 2 Score 0 0 0 0 0 1 0  PHQ- 9 Score  0 1 2 2 5 4     Fall Risk    01/14/2023   10:52 AM 01/07/2023    1:50 PM  06/08/2022    2:04 PM 03/14/2021    6:59 PM 03/13/2021    2:24 PM  Fall Risk   Falls in the past year? 0 0 0 0 0  Number falls in past yr: 0 0 0 0 0  Injury with Fall? 0 0 0 0 0  Risk for fall due to : No Fall Risks   Orthopedic patient   Follow up Falls prevention discussed;Education provided;Falls evaluation completed   Falls evaluation completed     MEDICARE RISK AT HOME:  Medicare Risk at Home - 01/14/23 1052     Any stairs in or around the home? No    If so, are there any without handrails? No    Home free of loose throw rugs in walkways, pet beds, electrical cords, etc? Yes    Adequate lighting in your home to reduce risk of falls? Yes    Life alert? No    Use of a cane, walker or w/c? No    Grab bars in the bathroom? Yes    Shower chair or bench in shower? No    Elevated toilet seat or a handicapped toilet? Yes             TIMED UP AND GO:  Was the test performed?  No    Cognitive Function:        01/14/2023    4:10 PM 03/14/2021    6:59 PM 02/02/2019   10:00 AM  6CIT Screen  What Year? 0 points 0 points 0 points  What month? 0 points 0 points 0 points  What time? 0 points 0 points 0 points  Count back from 20 0 points 0 points 0 points  Months in  reverse 0 points 0 points 0 points  Repeat phrase 0 points 0 points 0 points  Total Score 0 points 0 points 0 points    Immunizations Immunization History  Administered Date(s) Administered   Fluad Quad(high Dose 65+) 06/29/2020, 06/08/2021, 05/04/2022   Influenza Split 05/23/2011   Influenza,inj,Quad PF,6+ Mos 05/18/2013, 05/17/2014, 05/12/2015, 07/26/2016, 04/17/2017, 05/06/2018, 03/27/2019   PFIZER(Purple Top)SARS-COV-2 Vaccination 09/19/2019, 10/17/2019, 07/05/2020   Pneumococcal Conjugate-13 12/08/2014   Pneumococcal Polysaccharide-23 05/23/2011   Td 11/20/2005   Tdap 12/30/2019    TDAP status: Up to date  Pneumococcal vaccine status: Up to date  Covid-19 vaccine status: Information provided on  how to obtain vaccines.   Qualifies for Shingles Vaccine? Yes   Zostavax completed No   Shingrix Completed?: No.    Education has been provided regarding the importance of this vaccine. Patient has been advised to call insurance company to determine out of pocket expense if they have not yet received this vaccine. Advised may also receive vaccine at local pharmacy or Health Dept. Verbalized acceptance and understanding.  Screening Tests Health Maintenance  Topic Date Due   Zoster Vaccines- Shingrix (1 of 2) Never done   COVID-19 Vaccine (4 - 2023-24 season) 03/23/2022   INFLUENZA VACCINE  02/21/2023   Medicare Annual Wellness (AWV)  01/14/2024   DTaP/Tdap/Td (3 - Td or Tdap) 12/29/2029   Pneumonia Vaccine 57+ Years old  Completed   DEXA SCAN  Completed   Hepatitis C Screening  Completed   HPV VACCINES  Aged Out   Colonoscopy  Discontinued    Health Maintenance  Health Maintenance Due  Topic Date Due   Zoster Vaccines- Shingrix (1 of 2) Never done   COVID-19 Vaccine (4 - 2023-24 season) 03/23/2022    Colorectal cancer screening: No longer required.   Mammogram status: No longer required due to age and preference.  Bone Density status: Completed 10/20/14. Results reflect: Bone density results: NORMAL. Repeat every 5 years.  Lung Cancer Screening: (Low Dose CT Chest recommended if Age 90-80 years, 20 pack-year currently smoking OR have quit w/in 15years.) does not qualify.   Lung Cancer Screening Referral: n/a  Additional Screening:  Hepatitis C Screening: does qualify; Completed 04/17/17  Vision Screening: Recommended annual ophthalmology exams for early detection of glaucoma and other disorders of the eye. Is the patient up to date with their annual eye exam?  No  Who is the provider or what is the name of the office in which the patient attends annual eye exams? none If pt is not established with a provider, would they like to be referred to a provider to establish care?  No .   Dental Screening: Recommended annual dental exams for proper oral hygiene  Community Resource Referral / Chronic Care Management: CRR required this visit?  No   CCM required this visit?  No     Plan:     I have personally reviewed and noted the following in the patient's chart:   Medical and social history Use of alcohol, tobacco or illicit drugs  Current medications and supplements including opioid prescriptions. Patient is not currently taking opioid prescriptions. Functional ability and status Nutritional status Physical activity Advanced directives List of other physicians Hospitalizations, surgeries, and ER visits in previous 12 months Vitals Screenings to include cognitive, depression, and falls Referrals and appointments  In addition, I have reviewed and discussed with patient certain preventive protocols, quality metrics, and best practice recommendations. A written personalized care plan for preventive services as well  as general preventive health recommendations were provided to patient.     Kandis Fantasia [place], [place]   [id number]   After Visit Summary: (Mail) Due to this being a telephonic visit, the after visit summary with patients personalized plan was offered to patient via mail   Nurse Notes: No concerns

## 2023-01-28 ENCOUNTER — Other Ambulatory Visit: Payer: Self-pay | Admitting: Family Medicine

## 2023-01-28 DIAGNOSIS — K219 Gastro-esophageal reflux disease without esophagitis: Secondary | ICD-10-CM

## 2023-02-17 NOTE — Progress Notes (Unsigned)
Cardiology Office Note:    Date:  02/19/2023   ID:  [name] Page, DOB 19-Dec-1945, MRN [id number]  PCP:  [name] Mola, MD   [facility]  Cardiologist:  None  Advanced Practice Provider:  No care team member to display Electrophysiologist:  None    Referring MD: [name] Mola, MD     History of Present Illness:    [name] Page is a 76 y.o. female with a hx of HTN, COPD, coronary artery disease with history of MI, and HLD who presents to clinic for follow-up.  The patient states that she has a history of MI in 1997 where a PCI x2. TTE 06/2020 with normal LVEF, moderate LVH, moderate MAC, aortic sclerosis, RAP .  Was last seen in clinic in 07/2022 where she was doing well. Had intermittent palpitations that were not sustaining. Zio showed NSR, 2 runs of NSVT lasting 12 beats, and 7 runs of SVT with longest lasting 18 beats. Overall rare SVE and VE (<1%). TTE with EF 60-65%, G1 DD, normal RV, moderate MAC.  Today, the patient overall feels well. No chest pain, SOB, orthopnea or PND. HR are well controlled and palpitations are improved. Doing much better on the bisoprolol 10mg  daily. Blood pressure 120-130s/70s at home.  Past Medical History:  Diagnosis Date   Allergic rhinitis 09/19/2006   Qualifier: Diagnosis of  By: [name] Brazen MD, [name] Page     Allergy    Arthritis    [place] tarry stools    from iron pills   CHF (congestive heart failure) (HCC) 1997   Colon polyps 2005   COPD (chronic obstructive pulmonary disease) (HCC)    GERD (gastroesophageal reflux disease)    Heart attack (HCC) 1997 and 2001   Hyperlipidemia    Hypertension    Otomycosis of both ears 12/28/2019   Pneumonia 2012   Status post dilation of esophageal narrowing    TMJ dysfunction 03/27/2019    Past Surgical History:  Procedure Laterality Date   CESAREAN SECTION  01/1976   COLONOSCOPY     CORONARY ANGIOPLASTY WITH STENT PLACEMENT  2002   POLYPECTOMY      Current  Medications: Current Meds  Medication Sig   amLODipine (NORVASC) 10 MG tablet TAKE 1 TABLET(10 MG) BY MOUTH DAILY   aspirin EC 81 MG tablet Take 81 mg by mouth daily.   cetirizine (ZYRTEC) 10 MG tablet TAKE 1 TABLET(10 MG) BY MOUTH DAILY   cilostazol (PLETAL) 100 MG tablet TAKE 1 TABLET(100 MG) BY MOUTH TWICE DAILY   diclofenac Sodium (VOLTAREN) 1 % GEL Apply 4 g topically 4 (four) times daily as needed.   famotidine (PEPCID) 20 MG tablet TAKE 1 TABLET BY MOUTH UPTO TWICE DAILY AS NEEDED   hydrochlorothiazide (HYDRODIURIL) 12.5 MG tablet Take 1 tablet (12.5 mg total) by mouth daily.   losartan (COZAAR) 100 MG tablet TAKE 1 TABLET(100 MG) BY MOUTH DAILY   pravastatin (PRAVACHOL) 40 MG tablet TAKE 1 TABLET(40 MG) BY MOUTH DAILY   [DISCONTINUED] bisoprolol (ZEBETA) 10 MG tablet Take 10 mg by mouth daily.     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    Patient has close relationships with her daughter, [name] Page, and surrounding family in the area.    Patient enjoys cooking and writing poetry. 8.23.22 - pt does cook as much. Pt enjoys word searches and reading.     Patient is separated from her husband, however they maintain a close friendship.     Social Determinants of Health   Financial Resource Strain: Low Risk  (01/14/2023)   Overall Financial Resource Strain (CARDIA)    Difficulty of Paying Living Expenses: Not hard at all  Food Insecurity: No Food Insecurity (01/14/2023)   Hunger Vital Sign    Worried About Running Out of Food in the Last Year: Never true    Ran Out of Food in the Last Year: Never true  Transportation Needs: No Transportation Needs (01/14/2023)   PRAPARE - Administrator, Civil Service (Medical): No    Lack of Transportation (Non-Medical): No  Physical Activity: Sufficiently Active (01/14/2023)   Exercise Vital Sign    Days of Exercise per Week: 5 days    Minutes of Exercise per Session: 30 min  Stress: No Stress Concern Present (01/14/2023)   [employer] of Occupational Health - Occupational Stress Questionnaire    Feeling of Stress : Not at all  Social Connections: Moderately Isolated (01/14/2023)   Social Connection and Isolation Panel [NHANES]    Frequency of Communication with Friends and Family: More than three times a week    Frequency of Social Gatherings with Friends and Family: Twice a week    Attends Religious Services: More than 4 times per year    Active Member of [employer] or Organizations: No    Attends Banker Meetings: Never    Marital Status: Separated     Family History: The patient's family history includes Cancer in her mother; Clotting disorder in her brother; Colon cancer (age of onset: 72) in her mother; Diabetes in her sister; Esophageal cancer in her sister; Heart disease in her father. There is no history of Rectal cancer or Stomach cancer.  ROS:   Please see the history of present illness.      EKGs/Labs/Other Studies Reviewed:    The following studies were reviewed today: Cardiac Studies & Procedures       ECHOCARDIOGRAM  ECHOCARDIOGRAM COMPLETE 10/09/2022  Narrative ECHOCARDIOGRAM REPORT    Patient Name:   [name] Page Date of  Exam: 10/09/2022 Medical Rec #:  [id number]         Height:       67.0 in Accession #:    [id number]        Weight:       141.0 lb Date of Birth:  [birth year]         BSA:          1.743 m Patient Age:    76 years          BP:  134/76 mmHg Patient Gender: F                 HR:           62 bpm. Exam Location:  [address]  Procedure: 2D Echo, Cardiac Doppler and Color Doppler  Indications:    R00.2 Palpitations  History:        Patient has prior history of Echocardiogram examinations, most recent 07/12/2020. Previous Myocardial Infarction, Abnormal ECG, Paplitations and COPD, Arrythmias:SVT; Risk Factors:Hypertension, Dyslipidemia and Current Smoker.  Sonographer:    Samule Ohm RDCS Referring Phys: [id number] Meriam Sprague   Sonographer Comments: Suboptimal parasternal window. IMPRESSIONS   1. Left ventricular ejection fraction, by estimation, is 60 to 65%. The left ventricle has normal function. The left ventricle has no regional wall motion abnormalities. There is mild asymmetric left ventricular hypertrophy of the basal-septal segment. Left ventricular diastolic parameters are consistent with Grade I diastolic dysfunction (impaired relaxation). 2. Right ventricular systolic function is normal. The right ventricular size is normal. There is normal pulmonary artery systolic pressure. 3. Left atrial size was moderately dilated. 4. No evidence of mitral valve regurgitation. Moderate mitral annular calcification. 5. Aortic valve regurgitation is trivial. 6. The inferior vena cava is normal in size with greater than 50% respiratory variability, suggesting right atrial pressure of 3 mmHg.  Comparison(s): No significant change from prior study.  FINDINGS Left Ventricle: Left ventricular ejection fraction, by estimation, is 60 to 65%. The left ventricle has normal function. The left ventricle has no regional wall motion abnormalities. The left ventricular internal cavity  size was normal in size. There is mild asymmetric left ventricular hypertrophy of the basal-septal segment. Left ventricular diastolic parameters are consistent with Grade I diastolic dysfunction (impaired relaxation).  Right Ventricle: The right ventricular size is normal. Right ventricular systolic function is normal. There is normal pulmonary artery systolic pressure. The tricuspid regurgitant velocity is 1.97 m/s, and with an assumed right atrial pressure of 3 mmHg, the estimated right ventricular systolic pressure is 18.5 mmHg.  Left Atrium: Left atrial size was moderately dilated.  Right Atrium: Right atrial size was normal in size.  Pericardium: Trivial pericardial effusion is present.  Mitral Valve: Moderate mitral annular calcification. No evidence of mitral valve regurgitation.  Tricuspid Valve: Tricuspid valve regurgitation is trivial.  Aortic Valve: Aortic valve regurgitation is trivial. Aortic regurgitation PHT measures 573 msec.  Pulmonic Valve: Pulmonic valve regurgitation is not visualized.  Aorta: The aortic root and ascending aorta are structurally normal, with no evidence of dilitation.  Venous: The inferior vena cava is normal in size with greater than 50% respiratory variability, suggesting right atrial pressure of 3 mmHg.  IAS/Shunts: No atrial level shunt detected by color flow Doppler.   LEFT VENTRICLE PLAX 2D LVIDd:         3.20 cm   Diastology LVIDs:         2.30 cm   LV e' medial:    9.68 cm/s LV PW:         1.20 cm   LV E/e' medial:  10.3 LV IVS:        1.50 cm   LV e' lateral:   6.53 cm/s LVOT diam:     1.80 cm   LV E/e' lateral: 15.3 LV SV:         70 LV SV Index:   40 LVOT Area:     2.54 cm   RIGHT VENTRICLE  IVC RV S prime:     20.60 cm/s  IVC diam: 1.00 cm TAPSE (M-mode): 1.8 cm RVSP:           18.5 mmHg  LEFT ATRIUM             Index        RIGHT ATRIUM           Index LA diam:        3.90 cm 2.24 cm/m   RA Pressure: 3.00  mmHg LA Vol (A2C):   69.6 ml 39.93 ml/m  RA Area:     10.70 cm LA Vol (A4C):   46.9 ml 26.91 ml/m  RA Volume:   23.00 ml  13.20 ml/m LA Biplane Vol: 60.9 ml 34.94 ml/m AORTIC VALVE LVOT Vmax:   124.00 cm/s LVOT Vmean:  84.700 cm/s LVOT VTI:    0.274 m AI PHT:      573 msec  AORTA Ao Root diam: 3.00 cm Ao Asc diam:  3.50 cm  MITRAL VALVE                TRICUSPID VALVE MV Area (PHT): 3.26 cm     TR Peak grad:   15.5 mmHg MV Decel Time: 233 msec     TR Vmax:        197.00 cm/s MV E velocity: 99.80 cm/s   Estimated RAP:  3.00 mmHg MV A velocity: 110.67 cm/s  RVSP:           18.5 mmHg MV E/A ratio:  0.90 SHUNTS Systemic VTI:  0.27 m Systemic Diam: 1.80 cm  [name] Clines Electronically signed by [name] Clines Signature Date/Time: 10/09/2022/5:26:57 PM    Final    MONITORS  LONG TERM MONITOR (3-14 DAYS) 09/17/2022  Narrative   Patch wear time was 13 days and 23 hours   Predominant rhythm was NSR with average HR 72bpm   2 runs of NSVT with longest lasitng 12 beats   7 runs of SVT with longest lasting 18 beats   Rare SVE (<1%), rare VE (<1%)   Patch Wear Time:  13 days and 23 hours (2024-02-01T09:02:32-0500 to 2024-02-15T09:02:28-0500)  Patient had a min HR of 44 bpm, max HR of 182 bpm, and avg HR of 76 bpm. Predominant underlying rhythm was Sinus Rhythm. 2 Ventricular Tachycardia runs occurred, the run with the fastest interval lasting 4 beats with a max rate of 182 bpm, the longest lasting 12 beats with an avg rate of 145 bpm. 7 Supraventricular Tachycardia runs occurred, the run with the fastest interval lasting 8 beats with a max rate of 124 bpm, the longest lasting 18 beats with an avg rate of 98 bpm. Isolated SVEs were rare (<1.0%), SVE Couplets were rare (<1.0%), and SVE Triplets were rare (<1.0%). Isolated VEs were rare (<1.0%, 11127), VE Couplets were rare (<1.0%, 105), and VE Triplets were rare (<1.0%, 1). Ventricular Bigeminy and Trigeminy were present.  Laurance Flatten, MD            EKG:  NSR with sinus arrhythmias, HR 69-personally reviewed  Recent Labs: 01/07/2023: BUN 9; Creatinine, Ser 0.72; Potassium 4.0; Sodium 134  Recent Lipid Panel    Component Value Date/Time   CHOL 155 01/19/2022 1606   TRIG 170 (H) 01/19/2022 1606   HDL 57 01/19/2022 1606   CHOLHDL 2.7 01/19/2022 1606   CHOLHDL 4.0 11/07/2015 1604   VLDL 49 (H) 11/07/2015 1604   LDLCALC 70 01/19/2022 1606  Physical Exam:    VS:  BP 138/76   Pulse 76   Ht 5\' 2"  (1.575 m)   Wt 144 lb 9.6 oz (65.6 kg)   SpO2 95%   BMI 26.45 kg/m     Wt Readings from Last 3 Encounters:  02/19/23 144 lb 9.6 oz (65.6 kg)  01/14/23 146 lb (66.2 kg)  01/07/23 146 lb (66.2 kg)     GEN:  Comfortable, NAD HEENT: Normal NECK: No JVD; No carotid bruits CARDIAC: RRR, 2/6 systolic murmur at RUSB RESPIRATORY:  CTAB ABDOMEN: Soft, non-tender, non-distended MUSCULOSKELETAL:  No edema, warm SKIN: Warm and dry NEUROLOGIC:  Alert and oriented x 3 PSYCHIATRIC:  Normal affect   ASSESSMENT:    1. Coronary artery disease involving native coronary artery of native heart without angina pectoris   2. Abnormal Holter monitor finding   3. SVT (supraventricular tachycardia)   4. Essential hypertension   5. Palpitations   6. HYPERCHOLESTEROLEMIA      PLAN:    In order of problems listed above:  #Coronary Artery Disease with Remote MI: Doing well without anginal symptoms. TTE with normal LVEF 60-65%, G1DD, moderate MAC. -Continue ASA 81mg  daily -Continue prava 40mg  daily -Losartan 100mg  daily -Bisoprolol 5mg  daily  #Palpitations: Reassuring cardiac monitor with 2 runs of NSVT with longest lasting 12 beats and 7 runs of SVT with longest lasting 18 beats. Overall rare ectopy. TTE reassuring with normal BiV function and no significant valve disease. Currently, significantly improved. -Continue bisoprolol 10mg  daily  #HTN: Controlled in the office.  -Continue Losartan 100mg   daily -Continue bisoprolol 10mg  daily -Continue HCTZ 12.5mg  daily -Continue Amlodipine 10mg  daily -Low Na diet  #HLD: -Continue pravastatin 40mg  daily -LDL well controlled 70 -Monitored by PCP -Continue lifestyle modifications  #Systolic Murmur: -TTE reassuring with no significant valve disease    Medication Adjustments/Labs and Tests Ordered: Current medicines are reviewed at length with the patient today.  Concerns regarding medicines are outlined above.  Orders Placed This Encounter  Procedures   EKG 12-Lead   Meds ordered this encounter  Medications   bisoprolol (ZEBETA) 10 MG tablet    Sig: Take 1 tablet (10 mg total) by mouth daily.    Dispense:  90 tablet    Refill:  3    Patient Instructions  Medication Instructions:   Your physician recommends that you continue on your current medications as directed. Please refer to the Current Medication list given to you today.  *If you need a refill on your cardiac medications before your next appointment, please call your pharmacy*    Follow-Up: At [facility], you and your health needs are our priority.  As part of our continuing mission to provide you with exceptional heart care, we have created designated Provider Care Teams.  These Care Teams include your primary Cardiologist (physician) and Advanced Practice Providers (APPs -  Physician Assistants and Nurse Practitioners) who all work together to provide you with the care you need, when you need it.  We recommend signing up for the patient portal called "MyChart".  Sign up information is provided on this After Visit Summary.  MyChart is used to connect with patients for Virtual Visits (Telemedicine).  Patients are able to view lab/test results, encounter notes, upcoming appointments, etc.  Non-urgent messages can be sent to your provider as well.   To learn more about what you can do with MyChart, go to [email or web address].    Your next appointment:   6  month(s)  Provider:  Dr. Donato Schultz     Signed, Meriam Sprague, MD  02/19/2023 3:10 PM    [facility]

## 2023-02-19 ENCOUNTER — Ambulatory Visit: Payer: Medicare Other | Attending: Cardiology | Admitting: Cardiology

## 2023-02-19 ENCOUNTER — Encounter: Payer: Self-pay | Admitting: Cardiology

## 2023-02-19 VITALS — BP 138/76 | HR 76 | Ht 62.0 in | Wt 144.6 lb

## 2023-02-19 DIAGNOSIS — I10 Essential (primary) hypertension: Secondary | ICD-10-CM

## 2023-02-19 DIAGNOSIS — I2510 Atherosclerotic heart disease of native coronary artery without angina pectoris: Secondary | ICD-10-CM

## 2023-02-19 DIAGNOSIS — R9431 Abnormal electrocardiogram [ECG] [EKG]: Secondary | ICD-10-CM | POA: Diagnosis not present

## 2023-02-19 DIAGNOSIS — I471 Supraventricular tachycardia, unspecified: Secondary | ICD-10-CM | POA: Diagnosis not present

## 2023-02-19 DIAGNOSIS — E7800 Pure hypercholesterolemia, unspecified: Secondary | ICD-10-CM | POA: Diagnosis not present

## 2023-02-19 DIAGNOSIS — R002 Palpitations: Secondary | ICD-10-CM | POA: Diagnosis not present

## 2023-02-19 MED ORDER — BISOPROLOL FUMARATE 10 MG PO TABS: 10.0000 mg | ORAL_TABLET | Freq: Every day | ORAL | 3 refills | Status: DC

## 2023-02-19 NOTE — Patient Instructions (Signed)
Medication Instructions:   Your physician recommends that you continue on your current medications as directed. Please refer to the Current Medication list given to you today.  *If you need a refill on your cardiac medications before your next appointment, please call your pharmacy*    Follow-Up: At [facility], you and your health needs are our priority.  As part of our continuing mission to provide you with exceptional heart care, we have created designated Provider Care Teams.  These Care Teams include your primary Cardiologist (physician) and Advanced Practice Providers (APPs -  Physician Assistants and Nurse Practitioners) who all work together to provide you with the care you need, when you need it.  We recommend signing up for the patient portal called "MyChart".  Sign up information is provided on this After Visit Summary.  MyChart is used to connect with patients for Virtual Visits (Telemedicine).  Patients are able to view lab/test results, encounter notes, upcoming appointments, etc.  Non-urgent messages can be sent to your provider as well.   To learn more about what you can do with MyChart, go to [email or web address].    Your next appointment:   6 month(s)  Provider:   Dr. Mark Skains   

## 2023-03-30 ENCOUNTER — Other Ambulatory Visit: Payer: Self-pay | Admitting: Family Medicine

## 2023-03-30 DIAGNOSIS — I739 Peripheral vascular disease, unspecified: Secondary | ICD-10-CM

## 2023-04-03 ENCOUNTER — Other Ambulatory Visit: Payer: Self-pay | Admitting: Family Medicine

## 2023-04-03 DIAGNOSIS — I739 Peripheral vascular disease, unspecified: Secondary | ICD-10-CM

## 2023-04-11 ENCOUNTER — Other Ambulatory Visit: Payer: Self-pay

## 2023-04-11 ENCOUNTER — Ambulatory Visit (INDEPENDENT_AMBULATORY_CARE_PROVIDER_SITE_OTHER): Payer: Medicare Other | Admitting: Family Medicine

## 2023-04-11 ENCOUNTER — Encounter: Payer: Self-pay | Admitting: Family Medicine

## 2023-04-11 VITALS — BP 126/76 | HR 63 | Ht 62.0 in | Wt 143.0 lb

## 2023-04-11 DIAGNOSIS — F17200 Nicotine dependence, unspecified, uncomplicated: Secondary | ICD-10-CM | POA: Diagnosis not present

## 2023-04-11 DIAGNOSIS — I10 Essential (primary) hypertension: Secondary | ICD-10-CM

## 2023-04-11 DIAGNOSIS — M19041 Primary osteoarthritis, right hand: Secondary | ICD-10-CM | POA: Insufficient documentation

## 2023-04-11 DIAGNOSIS — Z23 Encounter for immunization: Secondary | ICD-10-CM

## 2023-04-11 DIAGNOSIS — Z716 Tobacco abuse counseling: Secondary | ICD-10-CM | POA: Diagnosis not present

## 2023-04-11 MED ORDER — NICOTINE POLACRILEX 4 MG MT LOZG: 4.0000 mg | LOZENGE | OROMUCOSAL | 0 refills | Status: AC | PRN

## 2023-04-11 NOTE — Assessment & Plan Note (Signed)
Chronic. Voltaren gel appears to be helping; however, patient is still in pain. Using Voltaren gel at night with gloves to increase absorption was recommended to patient. Additionally, patient encouraged to use heat and continue moving hands.

## 2023-04-11 NOTE — Assessment & Plan Note (Addendum)
Patient continuing to decrease cigarette use. Nicotine gum appears to be helping; however, since it is sticking to her dentures, nicotine lozenges sent to pharmacy.

## 2023-04-11 NOTE — Patient Instructions (Signed)
It was wonderful to see you today.  Please bring ALL of your medications with you to every visit.   Today we talked about:  Blood pressure - Your BP looks great! Keep taking your medications.   Arthritis - Keep using the voltaren gel and massaging your hands.   Smoking - You are getting so close! I have prescribed some lozenges, this way you will not have to get the gum stuck in your dentures.   Please follow up in 3 months on 12/20 at 2:10 pm.   Thank you for choosing [facility].   Please call [phone] with any questions about today's appointment.   Lockie Mola, MD  Family Medicine

## 2023-04-11 NOTE — Assessment & Plan Note (Signed)
Well controlled. Continue current medication regimen.

## 2023-04-11 NOTE — Progress Notes (Cosign Needed Addendum)
    SUBJECTIVE:   CHIEF COMPLAINT / HPI:   [name] Page is a 77 y.o. female presenting for follow up.   HTN: Patient currently prescribed losartan 100 mg daily, hydrochlorothiazide 12.5 mg daily an bisoprolol 10 mg daily. Patient denies dizziness, lightheadedness or recent falls.  Arthritis: Patient has x-rays in 2018 of the hand revealing osteoarthritis. Patient reports bilateral hand pain and stiffness especially in the morning. She is currently prescribed Voltaren gel as needed and reports relief with the gel. Additionally, she reports she purchased an over-the-counter arthritis and muscle cream for her hands.   Tobacco Use: Patient reports smoking about 2 cigarettes per day. Reports the nicotine gum is helping; however reports it is sticking to her dentures.   PERTINENT  PMH / PSH: HTN, COPD, polyarthralgia, tobacco use   OBJECTIVE:   BP 126/76   Pulse 63   Ht 5\' 2"  (1.575 m)   Wt 143 lb (64.9 kg)   SpO2 100%   BMI 26.16 kg/m    General: NAD, pleasant, cooperative Cardiac: RRR, no murmurs Respiratory: Clear to auscultation bilaterally, normal work of breathing Extremities: 2+ dp equal bilaterally   ASSESSMENT/PLAN:   Essential hypertension Well controlled. Continue current medication regimen.   Osteoarthritis of both hands Chronic. Voltaren gel appears to be helping; however, patient is still in pain. Using Voltaren gel at night with gloves to increase absorption was recommended to patient. Additionally, patient encouraged to use heat and continue moving hands.   Smoker Patient continuing to decrease cigarette use. Nicotine gum appears to be helping; however, since it is sticking to her dentures, nicotine lozenges sent to pharmacy.   Health Maintenance: - Flu vaccine offered; given today - COVID-19 vaccine offered; patient defers  FOLLOW-UP: Patient to return to clinic in 3 months on 12/20 at 2:10 p.m. for follow up.  Bubba Hales, Medical Student [facility]  I attest that I have reviewed the student note and that the components of the history of the present illness, the physical exam, and the assessment and plan documented were performed by me or were performed in my presence by the student where I verified the documentation and performed (or re-performed) the exam and medical decision making. I verify that the service and findings are accurately documented in the student's note.   Lockie Mola, MD                  04/14/2023, 2:40 AM

## 2023-04-26 ENCOUNTER — Other Ambulatory Visit: Payer: Self-pay | Admitting: Family Medicine

## 2023-04-26 DIAGNOSIS — I10 Essential (primary) hypertension: Secondary | ICD-10-CM

## 2023-05-02 ENCOUNTER — Other Ambulatory Visit: Payer: Self-pay | Admitting: Family Medicine

## 2023-05-02 DIAGNOSIS — I739 Peripheral vascular disease, unspecified: Secondary | ICD-10-CM

## 2023-05-03 ENCOUNTER — Other Ambulatory Visit: Payer: Self-pay | Admitting: Family Medicine

## 2023-05-03 DIAGNOSIS — I739 Peripheral vascular disease, unspecified: Secondary | ICD-10-CM

## 2023-05-31 ENCOUNTER — Other Ambulatory Visit: Payer: Self-pay | Admitting: Family Medicine

## 2023-05-31 ENCOUNTER — Encounter: Payer: Self-pay | Admitting: Gastroenterology

## 2023-05-31 DIAGNOSIS — I739 Peripheral vascular disease, unspecified: Secondary | ICD-10-CM

## 2023-07-12 ENCOUNTER — Ambulatory Visit (INDEPENDENT_AMBULATORY_CARE_PROVIDER_SITE_OTHER): Payer: Medicare Other | Admitting: Family Medicine

## 2023-07-12 ENCOUNTER — Encounter: Payer: Self-pay | Admitting: Family Medicine

## 2023-07-12 VITALS — BP 128/72 | HR 75 | Ht 62.0 in | Wt 141.0 lb

## 2023-07-12 DIAGNOSIS — M67431 Ganglion, right wrist: Secondary | ICD-10-CM

## 2023-07-12 DIAGNOSIS — M19042 Primary osteoarthritis, left hand: Secondary | ICD-10-CM

## 2023-07-12 DIAGNOSIS — H60543 Acute eczematoid otitis externa, bilateral: Secondary | ICD-10-CM

## 2023-07-12 DIAGNOSIS — M19041 Primary osteoarthritis, right hand: Secondary | ICD-10-CM | POA: Diagnosis not present

## 2023-07-12 DIAGNOSIS — M67432 Ganglion, left wrist: Secondary | ICD-10-CM | POA: Diagnosis not present

## 2023-07-12 DIAGNOSIS — M2550 Pain in unspecified joint: Secondary | ICD-10-CM

## 2023-07-12 LAB — POCT SEDIMENTATION RATE: POCT SED RATE: 21 mm/h (ref 0–22)

## 2023-07-12 MED ORDER — CELECOXIB 50 MG PO CAPS
50.0000 mg | ORAL_CAPSULE | Freq: Two times a day (BID) | ORAL | 0 refills | Status: DC
Start: 2023-07-12 — End: 2023-10-14

## 2023-07-12 MED ORDER — NEOMYCIN-POLYMYXIN-HC 3.5-10000-1 OT SUSP: 3.0000 [drp] | Freq: Four times a day (QID) | OTIC | 0 refills | Status: DC

## 2023-07-12 NOTE — Patient Instructions (Signed)
It was wonderful to see you today.  Please bring ALL of your medications with you to every visit.   Today we talked about:  Hands: I have prescribed celebrex, I am also doing some labs to see if there is not some sign of rheumatoid arthritis other than osteoarthritis. I have also put in a referral to PT   For your blood pressure, keep taking your medication.   For your ears, try the ear drops I sent. IF this does not help, we can do a referral to an Ear Nose Throat doctor.   Follow up in 3-4 weeks for arthritis   Thank you for choosing [facility].   Please call [phone] with any questions about today's appointment.  Please be sure to schedule follow up at the front desk before you leave today.   Lockie Mola, MD  Family Medicine

## 2023-07-12 NOTE — Progress Notes (Unsigned)
    SUBJECTIVE:   CHIEF COMPLAINT / HPI:   Tobacco Cessation  Not smoking anymore. Tried the lozenges. Is not going outside because of her arthritis.   Hypertension  Took her medication this morning   PERTINENT  PMH / PSH: ***  OBJECTIVE:   There were no vitals taken for this visit.  General: well appearing, in no acute distress CV: RRR, radial pulses equal and palpable, no BLE edema  Resp: Normal work of breathing on room air, CTAB Abd: Soft, non tender, non distended  Neuro: Alert & Oriented x 4    ASSESSMENT/PLAN:   There are no diagnoses linked to this encounter.    Lockie Mola, MD [facility]

## 2023-07-13 NOTE — Assessment & Plan Note (Signed)
Ganglion cyst on left hand and right wrist. Patient seems to have had them before. Could be that OA is irritating the tendons.  - Celebrex will help with inflammation  - Splint if patient can tolerate at home

## 2023-07-13 NOTE — Assessment & Plan Note (Signed)
Patient's previous Xrays have shown severe osteoarthritis and RA evaluation negative however, patient has significant worsening of hands since then. Concern for possible development of RA. Definitely affecting function.  - celebrex for 2 weeks  - ESR, CRP, anti ccp, anti RF  - follow up in 2 weeks  - Consider hand surgery referral if RA eval is negative

## 2023-07-15 ENCOUNTER — Telehealth: Payer: Self-pay | Admitting: Family Medicine

## 2023-07-15 DIAGNOSIS — M2550 Pain in unspecified joint: Secondary | ICD-10-CM

## 2023-07-15 MED ORDER — DICLOFENAC SODIUM 1 % EX GEL: 4.0000 g | Freq: Four times a day (QID) | CUTANEOUS | 3 refills | Status: AC | PRN

## 2023-07-15 NOTE — Telephone Encounter (Signed)
Called patient to discuss lab results.   Patient picked up the phone. Upon, asking patient says that her hand pain and swelling has greatly improved with celebrex. Asks for refill of diclofenac cream.   Informed patient that RA workup has been negative. Agrees to trying splint for wrist pain now that swelling has improved.   Patient understood and did not have further questions.

## 2023-07-16 LAB — C-REACTIVE PROTEIN: CRP: 9 mg/L (ref 0–10)

## 2023-07-16 LAB — CYCLIC CITRUL PEPTIDE ANTIBODY, IGG/IGA: Cyclic Citrullin Peptide Ab: 6 U (ref 0–19)

## 2023-07-16 LAB — ANA: Anti Nuclear Antibody (ANA): NEGATIVE

## 2023-07-16 LAB — RHEUMATOID FACTOR: Rheumatoid fact SerPl-aCnc: <10 [IU]/mL (ref <14.0)

## 2023-07-23 ENCOUNTER — Telehealth: Payer: Self-pay | Admitting: Family Medicine

## 2023-07-23 DIAGNOSIS — M19041 Primary osteoarthritis, right hand: Secondary | ICD-10-CM

## 2023-07-23 NOTE — Telephone Encounter (Addendum)
 Referral to occupational therapy placed.  ----- Message from [facility] [name] sent at 07/22/2023  3:04 PM EST ----- Regarding: FW: Change of referral Hi Dr. Nicholas,  Will you change this referral. Please see message below. [name] ----- Message ----- From: [name] Sent: 07/22/2023  12:20 PM EST To: Reena [name], CMA Subject: Change of referral                             [name] Page called from [facility] stating he is need the referral changed to Occupational Therapy instead of Physical Therapy.   His number is: [phone] in case there is any questions.   Thanks!

## 2023-07-30 ENCOUNTER — Other Ambulatory Visit: Payer: Self-pay

## 2023-07-30 ENCOUNTER — Other Ambulatory Visit: Payer: Self-pay | Admitting: Family Medicine

## 2023-07-30 DIAGNOSIS — K219 Gastro-esophageal reflux disease without esophagitis: Secondary | ICD-10-CM

## 2023-07-30 DIAGNOSIS — I2510 Atherosclerotic heart disease of native coronary artery without angina pectoris: Secondary | ICD-10-CM

## 2023-07-30 MED ORDER — PRAVASTATIN SODIUM 40 MG PO TABS: ORAL_TABLET | ORAL | 0 refills | Status: DC

## 2023-08-14 ENCOUNTER — Ambulatory Visit: Payer: Medicare Other | Admitting: Occupational Therapy

## 2023-08-14 NOTE — Therapy (Deleted)
OUTPATIENT OCCUPATIONAL THERAPY NEURO EVALUATION  Patient Name: [name] Page MRN: [id number] DOB:[birth year], 78 y.o., female Today's Date: 08/14/2023  PCP: Lockie Mola, MD  REFERRING PROVIDER: Westley Chandler, MD   END OF SESSION:   Past Medical History:  Diagnosis Date   Allergic rhinitis 09/19/2006   Qualifier: Diagnosis of  By: Luz Brazen MD, Jessica     Allergy    Arthritis    Martian tarry stools    from iron pills   CHF (congestive heart failure) (HCC) 1997   Colon polyps 2005   COPD (chronic obstructive pulmonary disease) (HCC)    GERD (gastroesophageal reflux disease)    Heart attack (HCC) 1997 and 2001   Hyperlipidemia    Hypertension    Otomycosis of both ears 12/28/2019   Pneumonia 2012   Status post dilation of esophageal narrowing    TMJ dysfunction 03/27/2019   Past Surgical History:  Procedure Laterality Date   CESAREAN SECTION  01/1976   COLONOSCOPY     CORONARY ANGIOPLASTY WITH STENT PLACEMENT  2002   POLYPECTOMY     Patient Active Problem List   Diagnosis Date Noted   Osteoarthritis of both hands 04/11/2023   Dyshidrotic eczema 06/08/2022   Ganglion cyst of both wrists 06/08/2022   Nevus 01/19/2022   Vacuolar interface dermatitis 06/08/2021   Rash 02/17/2021   Hyponatremia 11/23/2020   Grief reaction 11/23/2020   Atypical chest pain 09/28/2020   Dry skin dermatitis 09/28/2020   Systolic murmur 06/30/2020   Hair loss 09/19/2017   Osteopenia 11/04/2014   Polyarthralgia 10/17/2012   COPD (chronic obstructive pulmonary disease) (HCC) 10/01/2011   Restrictive lung disease 10/01/2011   GERD (gastroesophageal reflux disease) 11/20/2006   HYPERCHOLESTEROLEMIA 09/19/2006   Smoker 09/19/2006   Essential hypertension 09/19/2006   Coronary atherosclerosis 09/19/2006   CLAUDICATION, INTERMITTENT 09/19/2006    ONSET DATE: 07/23/2023 (referral date)  REFERRING DIAG: M19.041,M19.042 (ICD-10-CM) - Osteoarthritis of both hands, unspecified  osteoarthritis type   THERAPY DIAG:  No diagnosis found.  Rationale for Evaluation and Treatment: Rehabilitation  SUBJECTIVE:   SUBJECTIVE STATEMENT: *** Pt accompanied by: {accompnied:27141}  PERTINENT HISTORY: hx of tobacco use and subsequent cessation, T2DM, HTN, OA, CAD, COPD   Per 07/23/23 referral notes: Hand OA and right shoulder OA  Very difficult for her to do everyday tasks, frequently gets ganglion cysts   Per 07/12/23 MD Progress Notes: "Patient says her hand pain and swelling has gotten much worse. She is not able to open jars or use scissors or a knife. She is unable to hold a cup. She feels her fingers and wrists are more swollen. She also notes a "knot" on the dorsal surface of her Left hand and on the radial side of her right wrist. She says she has had knots like this before on her hands/wrist and they are very painful and go away on their own. She says celebrex has helped her hand pain and swelling previously. Says voltaren gel is not doing enough at this time. She has tried splints but does not appreciate much relief. Has gone to rheumatology almost 5 years ago but feels her hand pain has gotten much worse now."   PRECAUTIONS: {Therapy precautions:24002}  WEIGHT BEARING RESTRICTIONS: {Yes ***/No:24003}  PAIN:  Are you having pain? {OPRCPAIN:27236}  FALLS: Has patient fallen in last 6 months? {fallsyesno:27318}  LIVING ENVIRONMENT: Lives with: {OPRC lives with:25569::"lives with their family"} Lives in: {Lives in:25570} Stairs: {opstairs:27293} Has following equipment at home: {Assistive devices:23999}  PLOF: {PLOF:24004}  PATIENT GOALS: ***  OBJECTIVE:  Note: Objective measures were completed at Evaluation unless otherwise noted.  HAND DOMINANCE: {MISC; OT HAND DOMINANCE:[id number]}  ADLs: Overall ADLs: *** Transfers/ambulation related to ADLs: Eating: *** Grooming: *** UB Dressing: *** LB Dressing: *** Toileting: *** Bathing: *** Tub Shower  transfers: *** Equipment: {equipment:25573}  IADLs: Shopping: *** Light housekeeping: *** Meal Prep: *** Community mobility: *** Medication management: *** Financial management: *** Handwriting: {OTWRITTENEXPRESSION:25361}  MOBILITY STATUS: {OTMOBILITY:25360}  POSTURE COMMENTS:  {posture:25561} Sitting balance: {sitting balance:25483}  ACTIVITY TOLERANCE: Activity tolerance: ***  FUNCTIONAL OUTCOME MEASURES: {OTFUNCTIONALMEASURES:27238}  UPPER EXTREMITY ROM:    {AROM/PROM:27142} ROM Right eval Left eval  Shoulder flexion    Shoulder abduction    Shoulder adduction    Shoulder extension    Shoulder internal rotation    Shoulder external rotation    Elbow flexion    Elbow extension    Wrist flexion    Wrist extension    Wrist ulnar deviation    Wrist radial deviation    Wrist pronation    Wrist supination    (Blank rows = not tested)  UPPER EXTREMITY MMT:     MMT Right eval Left eval  Shoulder flexion    Shoulder abduction    Shoulder adduction    Shoulder extension    Shoulder internal rotation    Shoulder external rotation    Middle trapezius    Lower trapezius    Elbow flexion    Elbow extension    Wrist flexion    Wrist extension    Wrist ulnar deviation    Wrist radial deviation    Wrist pronation    Wrist supination    (Blank rows = not tested)  HAND FUNCTION: {handfunction:27230}  COORDINATION: {otcoordination:27237}  SENSATION: {sensation:27233}  EDEMA: ***  MUSCLE TONE: {UETONE:25567}  COGNITION: Overall cognitive status: {cognition:24006}  VISION: Subjective report: *** Baseline vision: {OTBASELINEVISION:25363} Visual history: {OTVISUALHISTORY:25364}  VISION ASSESSMENT: {visionassessment:27231}  Patient has difficulty with following activities due to following visual impairments: ***  PERCEPTION: {Perception:25564}  PRAXIS: {Praxis:25565}  OBSERVATIONS: ***                                                                                                                              TREATMENT DATE: ***         PATIENT EDUCATION: Education details: *** Person educated: {Person educated:25204} Education method: {Education Method:25205} Education comprehension: {Education Comprehension:25206}  HOME EXERCISE PROGRAM: ***   GOALS: Goals reviewed with patient? {yes/no:20286}  SHORT TERM GOALS: Target date: ***  *** Baseline: Goal status: INITIAL  2.  *** Baseline:  Goal status: INITIAL  3.  *** Baseline:  Goal status: INITIAL  4.  *** Baseline:  Goal status: INITIAL  5.  *** Baseline:  Goal status: INITIAL  6.  *** Baseline:  Goal status: INITIAL  LONG TERM GOALS: Target date: ***  *** Baseline:  Goal status: INITIAL  2.  *** Baseline:  Goal status:  INITIAL  3.  *** Baseline:  Goal status: INITIAL  4.  *** Baseline:  Goal status: INITIAL  5.  *** Baseline:  Goal status: INITIAL  6.  *** Baseline:  Goal status: INITIAL  ASSESSMENT:  CLINICAL IMPRESSION: Patient is a *** y.o. *** who was seen today for occupational therapy evaluation for ***.   PERFORMANCE DEFICITS: in functional skills including {OT physical skills:25468}, cognitive skills including {OT cognitive skills:25469}, and psychosocial skills including {OT psychosocial skills:25470}.   IMPAIRMENTS: are limiting patient from {OT performance deficits:25471}.   CO-MORBIDITIES: {Comorbidities:25485} that affects occupational performance. Patient will benefit from skilled OT to address above impairments and improve overall function.  MODIFICATION OR ASSISTANCE TO COMPLETE EVALUATION: {OT modification:25474}  OT OCCUPATIONAL PROFILE AND HISTORY: {OT PROFILE AND HISTORY:25484}  CLINICAL DECISION MAKING: {OT CDM:25475}  REHAB POTENTIAL: {rehabpotential:25112}  EVALUATION COMPLEXITY: {Evaluation complexity:25115}    PLAN:  OT FREQUENCY: {rehab frequency:25116}  OT DURATION: {rehab  duration:25117}  PLANNED INTERVENTIONS: {OT Interventions:25467}  RECOMMENDED OTHER SERVICES: ***  CONSULTED AND AGREED WITH PLAN OF CARE: {ZOX:09604}  PLAN FOR NEXT SESSION: ***   Wynetta Emery, OT 08/14/2023, 7:52 AM

## 2023-08-20 ENCOUNTER — Ambulatory Visit: Payer: Medicare Other | Attending: Family Medicine | Admitting: Occupational Therapy

## 2023-08-20 ENCOUNTER — Other Ambulatory Visit: Payer: Self-pay

## 2023-08-20 DIAGNOSIS — M19042 Primary osteoarthritis, left hand: Secondary | ICD-10-CM | POA: Insufficient documentation

## 2023-08-20 DIAGNOSIS — M6281 Muscle weakness (generalized): Secondary | ICD-10-CM | POA: Diagnosis not present

## 2023-08-20 DIAGNOSIS — R208 Other disturbances of skin sensation: Secondary | ICD-10-CM | POA: Insufficient documentation

## 2023-08-20 DIAGNOSIS — R278 Other lack of coordination: Secondary | ICD-10-CM | POA: Diagnosis not present

## 2023-08-20 DIAGNOSIS — R29898 Other symptoms and signs involving the musculoskeletal system: Secondary | ICD-10-CM | POA: Insufficient documentation

## 2023-08-20 DIAGNOSIS — M19041 Primary osteoarthritis, right hand: Secondary | ICD-10-CM | POA: Diagnosis not present

## 2023-08-20 NOTE — Therapy (Signed)
OUTPATIENT OCCUPATIONAL THERAPY NEURO EVALUATION  Patient Name: [name] Page MRN: [id number] DOB:December 23, 1945, 78 y.o., female Today's Date: 08/20/2023  PCP: Lockie Mola, MD  REFERRING PROVIDER: Westley Chandler, MD   END OF SESSION:  OT End of Session - 08/20/23 1628     Visit Number 1    Number of Visits 9   including eval   Date for OT Re-Evaluation 10/18/23    Authorization Type [employer], auth required    Progress Note Due on Visit 10    OT Start Time 1530    OT Stop Time 1622    OT Time Calculation (min) 52 min    Activity Tolerance Patient limited by pain    Behavior During Therapy [facility] for tasks assessed/performed             Past Medical History:  Diagnosis Date   Allergic rhinitis 09/19/2006   Qualifier: Diagnosis of  By: Luz Brazen MD, Jessica     Allergy    Arthritis    Stanczak tarry stools    from iron pills   CHF (congestive heart failure) (HCC) 1997   Colon polyps 2005   COPD (chronic obstructive pulmonary disease) (HCC)    GERD (gastroesophageal reflux disease)    Heart attack (HCC) 1997 and 2001   Hyperlipidemia    Hypertension    Otomycosis of both ears 12/28/2019   Pneumonia 2012   Status post dilation of esophageal narrowing    TMJ dysfunction 03/27/2019   Past Surgical History:  Procedure Laterality Date   CESAREAN SECTION  01/1976   COLONOSCOPY     CORONARY ANGIOPLASTY WITH STENT PLACEMENT  2002   POLYPECTOMY     Patient Active Problem List   Diagnosis Date Noted   Osteoarthritis of both hands 04/11/2023   Dyshidrotic eczema 06/08/2022   Ganglion cyst of both wrists 06/08/2022   Nevus 01/19/2022   Vacuolar interface dermatitis 06/08/2021   Rash 02/17/2021   Hyponatremia 11/23/2020   Grief reaction 11/23/2020   Atypical chest pain 09/28/2020   Dry skin dermatitis 09/28/2020   Systolic murmur 06/30/2020   Hair loss 09/19/2017   Osteopenia 11/04/2014   Polyarthralgia 10/17/2012   COPD (chronic obstructive pulmonary disease)  (HCC) 10/01/2011   Restrictive lung disease 10/01/2011   GERD (gastroesophageal reflux disease) 11/20/2006   HYPERCHOLESTEROLEMIA 09/19/2006   Smoker 09/19/2006   Essential hypertension 09/19/2006   Coronary atherosclerosis 09/19/2006   CLAUDICATION, INTERMITTENT 09/19/2006    ONSET DATE: 07/23/2023 (referral date)  REFERRING DIAG: M19.041,M19.042 (ICD-10-CM) - Osteoarthritis of both hands, unspecified osteoarthritis type   THERAPY DIAG:  Muscle weakness (generalized)  Other lack of coordination  Other symptoms and signs involving the musculoskeletal system  Other disturbances of skin sensation  Rationale for Evaluation and Treatment: Rehabilitation  SUBJECTIVE:   SUBJECTIVE STATEMENT: Pt reported pain of B hands and fingers, which interrupt sleep. Pt reported unable to make composite fist or straighten fingers. Pt reported swelling of B hands. Pt reported unable to hold cup or eating utensils. Pt reported pain radiates into arm.  Pt reported wrapping B hands with Ace bandage while sleeping based on reported recommendation from MD though pt reported probably wrapping too tight and removed d/t pain.   Pt reported that pt does not currently have arthritis gloves though pt has ordered some and should be arriving soon.  Pt reported "bumps and knots" at joints, and pointed to thumb IP and MCP joints and additional PIP/DIP joints of digits of BUE "it's just bulging."  Pt reported difficulty lifting arms overhead and cannot hold onto brush for grooming tasks.  Pt accompanied by: self  PERTINENT HISTORY: hx of tobacco use and subsequent cessation, T2DM, HTN, OA, CAD, COPD   Per 07/23/23 referral notes: "Hand OA and right shoulder OA  Very difficult for her to do everyday tasks, frequently gets ganglion cysts"  Per 07/12/23 MD Progress Notes: "Patient says her hand pain and swelling has gotten much worse. She is not able to open jars or use scissors or a knife. She is unable to  hold a cup. She feels her fingers and wrists are more swollen. She also notes a "knot" on the dorsal surface of her Left hand and on the radial side of her right wrist. She says she has had knots like this before on her hands/wrist and they are very painful and go away on their own. She says celebrex has helped her hand pain and swelling previously. Says voltaren gel is not doing enough at this time. She has tried splints but does not appreciate much relief. Has gone to rheumatology almost 5 years ago but feels her hand pain has gotten much worse now."   PRECAUTIONS: Fall   WEIGHT BEARING RESTRICTIONS: No  PAIN:  Are you having pain? Yes: NPRS scale: 6/10 Pain location: L shoulder and L ankle Pain description: sharp, stabbing Aggravating factors: morning time Relieving factors: afternoon time "usually not as bad"  FALLS: Has patient fallen in last 6 months? No  LIVING ENVIRONMENT: Lives with: lives alone Lives in: House/apartment Stairs: No Has following equipment at home: Dan Humphreys - 2 wheeled  PLOF: Pt reported symptoms worsened about 2 months ago "out of the blue," unsure of any causative factors. Pt reported 2 months ago, pt could complete ADL/IADL tasks, including grooming, dressing, driving, and household management.  PATIENT GOALS: "to get back some control of my hands"  OBJECTIVE:  Note: Objective measures were completed at Evaluation unless otherwise noted.  HAND DOMINANCE: Right  ADLs: Overall ADLs:  Eating: ind in P.M., avoids eating breakfast because increased difficulty with grasping objects in A.M. Grooming: Pt reported difficulty lifting arms overhead and cannot hold onto brush for grooming tasks. UB Dressing: ind with significantly extra time, avoiding buttons/zippers or use of large buttons LB Dressing: ind with slip-on shoes, elastic waist pants, extra time: "takes me 20 minutes to put on a pair of socks" Toileting: ind, some difficulty with wiping, per pt: limiting  water intake and avoiding drinking as much water to avoid toileting tasks as frequently secondary to pain. Bathing: ind with loofah sponge, difficulty holding shower brush, pain with loofah sponge wrapped around pain Tub Shower transfers: ind Equipment:  tub/shower  IADLs: Shopping: daughter assists Light housekeeping: difficulty "I'm doing the best I can." Meal Prep: ind for simple meals, unable to cut up items d/t difficulty grasping knife and pt concerns about safety Community mobility: daughter assists  Handwriting:  Pt reported "handwriting is not as good." Pt reported not frequently handwriting.  MOBILITY STATUS: Independent  POSTURE COMMENTS:  rounded shoulders and forward head Sitting balance:  ind  ACTIVITY TOLERANCE: Activity tolerance: Pt reported poor sleep quality d/t pain which limits energy.  FUNCTIONAL OUTCOME MEASURES: Quick Dash: 70.5% deficit    UPPER EXTREMITY ROM:    Active ROM Right eval Left eval  Shoulder flexion WFL (painful) Severe pain at approx. 60*, ultimately achieved Approx. 110* with report "like a knife sticking" in shoulder  Shoulder abduction WFL (painful, "it feels like it wants  to pop")   Shoulder adduction    Shoulder extension    Shoulder internal rotation    Shoulder external rotation    Elbow flexion [facility] WFL  Elbow extension [facility] WFL though extremely hesitant with movement, limited by pain  Wrist flexion 30* 20*  Wrist extension 58* 5*  Wrist ulnar deviation    Wrist radial deviation    Wrist pronation [facility] WFL  Wrist supination WFL WFL  (Blank rows = not tested)  Pt demo'd slower, hesitant movements with BUE though especially with LUE.  AROM RUE opposition between thumb and pinky - 0 cm AROM LUE opposition between thumb and pinky - 0.5 cm AROM Composite fist LUE - approx. 3.5 cm between digits and palm, impaired AROM Composite fist RUE - approx. 2 cm between digits and palm, impaired  PROM composite fist BUE - impaired,  similar flex of digits as compared to AROM  HAND FUNCTION: Tip pinch: Right 2 lbs, Left: 2 lbs  COORDINATION: 9-hold peg test: R: 36 seconds. L: 42 seconds.  Ataxic movements, large "shaking" movements of BUE.  SENSATION: Pt reported tingling/numbness at tips of digits 2-4 on R and digits 2-5 on L side.  EDEMA: Noted at joints   MUSCLE TONE: RUE: Rigidity and LUE: Rigidity  COGNITION: Overall cognitive status: Within functional limits for tasks assessed  PRAXIS: Not tested  OBSERVATIONS: Pt ambulated ind without A/E though with unsteady gait. Pt severely limited by pain with BUE movements, more impairment and limitations on L side compared to R side. Pt demo'd skin sloughing off on ulnar side of R palm, and pt reported hx of eczema and "popping blisters" (fluid-filled) on R palm yesterday. Pt demo'd moderate swelling at several joints of digits of BUE.                                                                                                                             TREATMENT DATE:    TherAct: OT educated pt on importance of hydration, safety with driving d/t safety concerns regarding pt's ability to grasp steering wheel and reaction time secondary to pain and limited AROM, pain threshold, and blister management to avoid popping blisters. Pt verbalized understanding of all.   PATIENT EDUCATION: Education details: OT role, POC, see today's tx above Person educated: Patient Education method: Explanation Education comprehension: verbalized understanding  HOME EXERCISE PROGRAM: TBD   GOALS: Goals reviewed with patient? Yes  SHORT TERM GOALS: Target date: 09/20/23  Patient will demonstrate updated BUE HEP with no more than min v/c and visual handouts. Baseline: new to outpt OT Goal status: INITIAL  2.  Pt will ind recall wear and care schedule for splinting/gloves options. Baseline: No current splint/gloves. Pt recently ordered arthritis gloves which will arrive  soon. Goal status: INITIAL  3.  Pt will recall at least 2 pain management strategies to decrease BUE pain. Baseline: new to outpt OT,   6/10 pain at rest which increases with  BUE movement (worse on L side compared to R side) Goal status: INITIAL  4.  Pt will verbalize understanding of sleep positioning strategies to protect BUE joints, decrease pain, and improve sleep quality. Baseline: new to outpt OT. Pt reported difficulty with sleep secondary to pain. Goal status: INITIAL  5.  Pt will recall at least 3 joint protection strategies. Baseline: new to outpt OT Goal status: INITIAL  6.  Pt will return demonstration of desensitization strategies to reduce pain/discomfort of BUE. Baseline: new to outpt OT Goal status: INITIAL  LONG TERM GOALS: Target date: 10/18/23  Using adaptive strategies and A/E PRN, pt will demo improved functional grasp and pinch with BUE to improve participation in ADL tasks. Baseline: Pt reported difficulty with grasping objects for ADL tasks, including but not limited to eating utensils, cup, brush, long-handled sponge, and pen. Goal status: INITIAL  3.  Pt will verbalize understanding of adaptive strategies and A/E to improve ind and decrease BUE pain for ADL bathing and toileting tasks. Baseline: Pt reported difficulty with bathing and toileting tasks secondary to BUE pain Goal status: INITIAL  4.  Patient will demonstrate at least 16% improvement with quick Dash score (reporting 54.5% disability or less) indicating improved functional use of affected extremity.  Baseline: Quick Dash: 70.5% deficit Goal status: INITIAL  5.  Patient will demo improved FM coordination as evidenced by completing nine-hole peg in 31 seconds or less with RUE and 37 seconds or less with LUE.  Baseline: 9-hold peg test: R: 36 seconds. L: 42 seconds. Goal status: INITIAL  ASSESSMENT:  CLINICAL IMPRESSION: Patient is a 78 y.o. female who was seen today for occupational therapy  evaluation for Osteoarthritis of both hands, unspecified osteoarthritis type. Hx includes hx of tobacco use and subsequent cessation, T2DM, HTN, OA, CAD, COPD. Patient currently presents below baseline level of functioning demonstrating functional deficits and impairments as noted below. Pt would benefit from skilled OT services in the outpatient setting to work on impairments as noted below to increase ind for ADL/IADL tasks, to decrease pain of BUE, and to improve understanding of adaptive strategies and A/E options to manage pain.  PERFORMANCE DEFICITS: in functional skills including ADLs, IADLs, coordination, dexterity, proprioception, sensation, edema, ROM, strength, pain, flexibility, Fine motor control, Gross motor control, mobility, balance, body mechanics, endurance, decreased knowledge of use of DME, and UE functional use, cognitive skills including  none , and psychosocial skills including environmental adaptation.   IMPAIRMENTS: are limiting patient from ADLs, IADLs, rest and sleep, leisure, and social participation.   CO-MORBIDITIES: may have co-morbidities  that affects occupational performance. Patient will benefit from skilled OT to address above impairments and improve overall function.  MODIFICATION OR ASSISTANCE TO COMPLETE EVALUATION: Min-Moderate modification of tasks or assist with assess necessary to complete an evaluation.  OT OCCUPATIONAL PROFILE AND HISTORY: Problem focused assessment: Including review of records relating to presenting problem.  CLINICAL DECISION MAKING: LOW - limited treatment options, no task modification necessary  REHAB POTENTIAL: Good  EVALUATION COMPLEXITY: Low    PLAN:  OT FREQUENCY: 1x/week - OT recommended 2x per week though pt reported difficulty with transportation and therefore requested 1x per week.  OT DURATION: 8 weeks  PLANNED INTERVENTIONS: 97168 OT Re-evaluation, 97535 self care/ADL training, 13086 therapeutic exercise, 97530  therapeutic activity, 97112 neuromuscular re-education, 97140 manual therapy, 97035 ultrasound, 97018 paraffin, 57846 fluidotherapy, 97010 moist heat, 97010 cryotherapy, 97760 Orthotics management and training, 96295 Splinting (initial encounter), [id number] Subsequent splinting/medication, passive range of motion, functional  mobility training, energy conservation, patient/family education, and DME and/or AE instructions  RECOMMENDED OTHER SERVICES: n/a  CONSULTED AND AGREED WITH PLAN OF CARE: Patient  PLAN FOR NEXT SESSION:   Did pt's arthritis gloves arrive? - Assess fit  HEP - ?tendon glides - monitor pain threshold, ?shoulder AAROM - monitor pain threshold, FM coordination  A/E and adaptive strategies for bathing, toileting, grasping objects (built-up handles and universal cuff  for eating utensils , pen, hair brush, long-handled sponge; bath mit for bathing tasks)  Joint protection handout  Pain management handout - modalities  Desensitization handout     Wynetta Emery, OT 08/20/2023, 4:57 PM

## 2023-08-21 ENCOUNTER — Ambulatory Visit: Payer: Medicare Other | Admitting: Occupational Therapy

## 2023-08-27 ENCOUNTER — Other Ambulatory Visit: Payer: Self-pay | Admitting: Family Medicine

## 2023-08-27 DIAGNOSIS — I739 Peripheral vascular disease, unspecified: Secondary | ICD-10-CM

## 2023-08-28 ENCOUNTER — Ambulatory Visit: Payer: Medicare Other | Attending: Family Medicine | Admitting: Occupational Therapy

## 2023-08-28 DIAGNOSIS — M6281 Muscle weakness (generalized): Secondary | ICD-10-CM

## 2023-08-28 DIAGNOSIS — R29898 Other symptoms and signs involving the musculoskeletal system: Secondary | ICD-10-CM | POA: Diagnosis not present

## 2023-08-28 DIAGNOSIS — M25542 Pain in joints of left hand: Secondary | ICD-10-CM | POA: Diagnosis not present

## 2023-08-28 DIAGNOSIS — M25541 Pain in joints of right hand: Secondary | ICD-10-CM | POA: Diagnosis not present

## 2023-08-28 DIAGNOSIS — R278 Other lack of coordination: Secondary | ICD-10-CM | POA: Diagnosis not present

## 2023-08-28 NOTE — Therapy (Signed)
 OUTPATIENT OCCUPATIONAL THERAPY NEURO TREATMENT  Patient Name: [name] Page MRN: [id number] DOB:1946/04/20, 78 y.o., female Today's Date: 08/28/2023  PCP: Nicholas Bar, MD  REFERRING PROVIDER: Delores Suzann HERO, MD   END OF SESSION:  OT End of Session - 08/28/23 1403     Visit Number 2    Number of Visits 9   including eval   Date for OT Re-Evaluation 10/18/23    Authorization Type [employer], auth required    Progress Note Due on Visit 10    OT Start Time 1401    OT Stop Time 1445    OT Time Calculation (min) 44 min    Activity Tolerance Patient tolerated treatment well    Behavior During Therapy WFL for tasks assessed/performed              Past Medical History:  Diagnosis Date   Allergic rhinitis 09/19/2006   Qualifier: Diagnosis of  By: [place] MD, Jessica     Allergy    Arthritis    [place] tarry stools    from iron pills   CHF (congestive heart failure) (HCC) 1997   Colon polyps 2005   COPD (chronic obstructive pulmonary disease) (HCC)    GERD (gastroesophageal reflux disease)    Heart attack (HCC) 1997 and 2001   Hyperlipidemia    Hypertension    Otomycosis of both ears 12/28/2019   Pneumonia 2012   Status post dilation of esophageal narrowing    TMJ dysfunction 03/27/2019   Past Surgical History:  Procedure Laterality Date   CESAREAN SECTION  01/1976   COLONOSCOPY     CORONARY ANGIOPLASTY WITH STENT PLACEMENT  2002   POLYPECTOMY     Patient Active Problem List   Diagnosis Date Noted   Osteoarthritis of both hands 04/11/2023   Dyshidrotic eczema 06/08/2022   Ganglion cyst of both wrists 06/08/2022   Nevus 01/19/2022   Vacuolar interface dermatitis 06/08/2021   Rash 02/17/2021   Hyponatremia 11/23/2020   Grief reaction 11/23/2020   Atypical chest pain 09/28/2020   Dry skin dermatitis 09/28/2020   Systolic murmur 06/30/2020   Hair loss 09/19/2017   Osteopenia 11/04/2014   Polyarthralgia 10/17/2012   COPD (chronic obstructive pulmonary  disease) (HCC) 10/01/2011   Restrictive lung disease 10/01/2011   GERD (gastroesophageal reflux disease) 11/20/2006   HYPERCHOLESTEROLEMIA 09/19/2006   Smoker 09/19/2006   Essential hypertension 09/19/2006   Coronary atherosclerosis 09/19/2006   CLAUDICATION, INTERMITTENT 09/19/2006    ONSET DATE: 07/23/2023 (referral date)  REFERRING DIAG: M19.041,M19.042 (ICD-10-CM) - Osteoarthritis of both hands, unspecified osteoarthritis type   THERAPY DIAG:  Pain in joint of right hand  Pain in joint of left hand  Muscle weakness (generalized)  Other lack of coordination  Other symptoms and signs involving the musculoskeletal system  Rationale for Evaluation and Treatment: Rehabilitation  SUBJECTIVE:   SUBJECTIVE STATEMENT:   Pt reported she had some blistering excema -- on thumb and in webspace that made holding things hard.  It has improved though.  She still has pain in B hands and fingers, which interrupts her sleep and she is still waiting for the arthritis gloves she ordered to arrive.  Pt accompanied by: self  PERTINENT HISTORY: hx of tobacco use and subsequent cessation, T2DM, HTN, OA, CAD, COPD   Per 07/23/23 referral notes: Hand OA and right shoulder OA  Very difficult for her to do everyday tasks, frequently gets ganglion cysts  Per 07/12/23 MD Progress Notes: Patient says her hand pain and swelling has  gotten much worse. She is not able to open jars or use scissors or a knife. She is unable to hold a cup. She feels her fingers and wrists are more swollen. She also notes a knot on the dorsal surface of her Left hand and on the radial side of her right wrist. She says she has had knots like this before on her hands/wrist and they are very painful and go away on their own. She says celebrex  has helped her hand pain and swelling previously. Says voltaren  gel is not doing enough at this time. She has tried splints but does not appreciate much relief. Has gone to rheumatology  almost 5 years ago but feels her hand pain has gotten much worse now.   PRECAUTIONS: Fall   WEIGHT BEARING RESTRICTIONS: No  PAIN:  Are you having pain? Yes: NPRS scale: R - 6, L - 8/10 Pain location: L shoulder up to neck, right hand Pain description: sharp, stabbing Aggravating factors: morning time Relieving factors: afternoon time usually not as bad  FALLS: Has patient fallen in last 6 months? No  LIVING ENVIRONMENT: Lives with: lives alone Lives in: House/apartment Stairs: No Has following equipment at home: Vannie - 2 wheeled  PLOF: Pt reported symptoms worsened about 2 months ago out of the blue, unsure of any causative factors. Pt reported 2 months ago, pt could complete ADL/IADL tasks, including grooming, dressing, driving, and household management.  PATIENT GOALS: to get back some control of my hands  OBJECTIVE:  Note: Objective measures were completed at Evaluation unless otherwise noted.  HAND DOMINANCE: Right  ADLs: Overall ADLs:  Eating: ind in P.M., avoids eating breakfast because increased difficulty with grasping objects in A.M. Grooming: Pt reported difficulty lifting arms overhead and cannot hold onto brush for grooming tasks. UB Dressing: ind with significantly extra time, avoiding buttons/zippers or use of large buttons LB Dressing: ind with slip-on shoes, elastic waist pants, extra time: takes me 20 minutes to put on a pair of socks Toileting: ind, some difficulty with wiping, per pt: limiting water intake and avoiding drinking as much water to avoid toileting tasks as frequently secondary to pain. Bathing: ind with loofah sponge, difficulty holding shower brush, pain with loofah sponge wrapped around pain Tub Shower transfers: ind Equipment:  tub/shower  IADLs: Shopping: daughter assists Light housekeeping: difficulty I'm doing the best I can. Meal Prep: ind for simple meals, unable to cut up items d/t difficulty grasping knife and pt  concerns about safety Community mobility: daughter assists  Handwriting:  Pt reported handwriting is not as good. Pt reported not frequently handwriting.  MOBILITY STATUS: Independent  POSTURE COMMENTS:  rounded shoulders and forward head Sitting balance:  ind  ACTIVITY TOLERANCE: Activity tolerance: Pt reported poor sleep quality d/t pain which limits energy.  FUNCTIONAL OUTCOME MEASURES: Quick Dash: 70.5% deficit    UPPER EXTREMITY ROM:    Active ROM Right eval Left eval  Shoulder flexion WFL (painful) Severe pain at approx. 60*, ultimately achieved Approx. 110* with report like a knife sticking in shoulder  Shoulder abduction WFL (painful, it feels like it wants to pop)   Shoulder adduction    Shoulder extension    Shoulder internal rotation    Shoulder external rotation    Elbow flexion [facility] WFL  Elbow extension [facility] WFL though extremely hesitant with movement, limited by pain  Wrist flexion 30* 20*  Wrist extension 58* 5*  Wrist ulnar deviation    Wrist radial deviation  Wrist pronation [facility] WFL  Wrist supination WFL WFL  (Blank rows = not tested)  Pt demo'd slower, hesitant movements with BUE though especially with LUE.  AROM RUE opposition between thumb and pinky - 0 cm AROM LUE opposition between thumb and pinky - 0.5 cm AROM Composite fist LUE - approx. 3.5 cm between digits and palm, impaired AROM Composite fist RUE - approx. 2 cm between digits and palm, impaired  PROM composite fist BUE - impaired, similar flex of digits as compared to AROM  HAND FUNCTION: Tip pinch: Right 2 lbs, Left: 2 lbs  COORDINATION: 9-hold peg test: R: 36 seconds. L: 42 seconds.  Ataxic movements, large shaking movements of BUE.  SENSATION: Pt reported tingling/numbness at tips of digits 2-4 on R and digits 2-5 on L side.  EDEMA: Noted at joints   MUSCLE TONE: RUE: Rigidity and LUE: Rigidity  COGNITION: Overall cognitive status: Within functional limits for  tasks assessed  PRAXIS: Not tested  OBSERVATIONS: Pt ambulated ind without A/E though with unsteady gait. Pt severely limited by pain with BUE movements, more impairment and limitations on L side compared to R side. Pt demo'd skin sloughing off on ulnar side of R palm, and pt reported hx of eczema and popping blisters (fluid-filled) on R palm yesterday. Pt demo'd moderate swelling at several joints of digits of BUE.                                                                                                                           TREATMENT DATE:    Therapeutic Exercises  Pt issued tendon gliding exercises/handout with review of motions to isolate DIP, PIP and MCP joints for straight finger position, and then modified-fist (DIP/PIP/MCP flexion), taco/duck (MCP flexion only) and modified- flat fist (MCP and PIP flexion).  Pt unable to perform hook (DIP/PIP flexion) position and was simply encouraged to work on opening and closing fingers with modified positions.  Performing sequence x 5 repetitions with OTR today did loosen hand and decrease noticeable swelling ie) increased visible wrinkles around dorsal joints noted.  Pt encouraged to work on motion 3-5 times/day with education on Motion is lotion ie) emphasizing the importance of movement for maintaining joint health and mobility.  Self Care  OT educated patient on sleep positioning as noted in patient instructions to reduce stress to upper extremity nerves/joints, which contributes to pain in extremities.  Importance of supporting limbs on extra pillows provided. Patient verbalized understanding. Handout provided.  Education also provided on simple Edema control options ie) elevation of hands/arms on pillows and gentle retrograde massage of hands like petting a cat ie) from her hand up towards the elbow as well as trial of the edema gloves she has ordered.   PATIENT EDUCATION: Education details: Tendon Glides, sleep position, edema  management Person educated: Patient Education method: Explanation, Demonstration, Tactile cues, Verbal cues, and Handouts Education comprehension: verbalized understanding, returned demonstration, verbal cues required, tactile cues required, and needs  further education  HOME EXERCISE PROGRAM: 08/28/23: Tendon Gliding Exercises, Sleep positions and edema control   GOALS: Goals reviewed with patient? Yes  SHORT TERM GOALS: Target date: 09/20/23  Patient will demonstrate updated BUE HEP with no more than min v/c and visual handouts. Baseline: new to outpt OT Goal status: IN Progress  2.  Pt will ind recall wear and care schedule for splinting/gloves options. Baseline: No current splint/gloves. Pt recently ordered arthritis gloves which will arrive soon. Goal status: INITIAL  3.  Pt will recall at least 2 pain management strategies to decrease BUE pain. Baseline: new to outpt OT,   6/10 pain at rest which increases with BUE movement (worse on L side compared to R side) Goal status: IN Progress  4.  Pt will verbalize understanding of sleep positioning strategies to protect BUE joints, decrease pain, and improve sleep quality. Baseline: new to outpt OT. Pt reported difficulty with sleep secondary to pain. Goal status: IN Progress  5.  Pt will recall at least 3 joint protection strategies. Baseline: new to outpt OT Goal status: IN Progress  6.  Pt will return demonstration of desensitization strategies to reduce pain/discomfort of BUE. Baseline: new to outpt OT Goal status: INITIAL  LONG TERM GOALS: Target date: 10/18/23  Using adaptive strategies and A/E PRN, pt will demo improved functional grasp and pinch with BUE to improve participation in ADL tasks. Baseline: Pt reported difficulty with grasping objects for ADL tasks, including but not limited to eating utensils, cup, brush, long-handled sponge, and pen. Goal status: INITIAL  3.  Pt will verbalize understanding of adaptive  strategies and A/E to improve ind and decrease BUE pain for ADL bathing and toileting tasks. Baseline: Pt reported difficulty with bathing and toileting tasks secondary to BUE pain Goal status: INITIAL  4.  Patient will demonstrate at least 16% improvement with quick Dash score (reporting 54.5% disability or less) indicating improved functional use of affected extremity.  Baseline: Quick Dash: 70.5% deficit Goal status: INITIAL  5.  Patient will demo improved FM coordination as evidenced by completing nine-hole peg in 31 seconds or less with RUE and 37 seconds or less with LUE.  Baseline: 9-hold peg test: R: 36 seconds. L: 42 seconds. Goal status: INITIAL  ASSESSMENT:  CLINICAL IMPRESSION: Patient is a 78 y.o. female who was seen today for occupational therapy treatment for Osteoarthritis of both hands. Patient currently presents below baseline level of function due to pain and discomfort in B UEs demonstrating functional deficits and impairments with self care and home management. Pt would benefit from skilled OT services in the outpatient setting to work on impairments including pain and decreased ROM/strength for increased ind for ADL/IADL tasks through improved understanding of adaptive strategies and A/E options to manage pain.  PERFORMANCE DEFICITS: in functional skills including ADLs, IADLs, coordination, dexterity, proprioception, sensation, edema, ROM, strength, pain, flexibility, Fine motor control, Gross motor control, mobility, balance, body mechanics, endurance, decreased knowledge of use of DME, and UE functional use, cognitive skills including  none , and psychosocial skills including environmental adaptation.   IMPAIRMENTS: are limiting patient from ADLs, IADLs, rest and sleep, leisure, and social participation.   CO-MORBIDITIES: may have co-morbidities  that affects occupational performance. Patient will benefit from skilled OT to address above impairments and improve overall  function.  REHAB POTENTIAL: Good  PLAN:  OT FREQUENCY: 1x/week - OT recommended 2x per week though pt reported difficulty with transportation and therefore requested 1x per week.  OT DURATION:  8 weeks  PLANNED INTERVENTIONS: 97168 OT Re-evaluation, 97535 self care/ADL training, 02889 therapeutic exercise, 97530 therapeutic activity, 97112 neuromuscular re-education, 97140 manual therapy, 97035 ultrasound, 97018 paraffin, 02960 fluidotherapy, 97010 moist heat, 97010 cryotherapy, 97760 Orthotics management and training, 02239 Splinting (initial encounter), [phone] Subsequent splinting/medication, passive range of motion, functional mobility training, energy conservation, patient/family education, and DME and/or AE instructions  RECOMMENDED OTHER SERVICES: n/a  CONSULTED AND AGREED WITH PLAN OF CARE: Patient  PLAN FOR NEXT SESSION:   Did pt's arthritis gloves arrive? - Assess fit  HEP - ?tendon glides - monitor pain threshold, ?shoulder AAROM - monitor pain threshold, FM coordination  A/E and adaptive strategies for bathing, toileting, grasping objects (built-up handles and universal cuff  for eating utensils , pen, hair brush, long-handled sponge; bath mit for bathing tasks)  Joint protection handout  Pain management handout - modalities  Desensitization handout   Clarita LITTIE Pride, OT 08/28/2023, 5:17 PM

## 2023-08-28 NOTE — Patient Instructions (Addendum)
 [name] Page

## 2023-09-04 ENCOUNTER — Ambulatory Visit: Payer: Medicare Other | Admitting: Occupational Therapy

## 2023-09-04 DIAGNOSIS — R29898 Other symptoms and signs involving the musculoskeletal system: Secondary | ICD-10-CM | POA: Diagnosis not present

## 2023-09-04 DIAGNOSIS — M25541 Pain in joints of right hand: Secondary | ICD-10-CM

## 2023-09-04 DIAGNOSIS — M25542 Pain in joints of left hand: Secondary | ICD-10-CM

## 2023-09-04 DIAGNOSIS — R278 Other lack of coordination: Secondary | ICD-10-CM | POA: Diagnosis not present

## 2023-09-04 DIAGNOSIS — M6281 Muscle weakness (generalized): Secondary | ICD-10-CM | POA: Diagnosis not present

## 2023-09-04 NOTE — Therapy (Signed)
OUTPATIENT OCCUPATIONAL THERAPY NEURO TREATMENT  Patient Name: [name] Page MRN: [id number] DOB:05-19-1946, 78 y.o., female Today's Date: 09/04/2023  PCP: Lockie Mola, MD REFERRING PROVIDER: Westley Chandler, MD   END OF SESSION:  OT End of Session - 09/04/23 1307     Visit Number 3    Number of Visits 9   including eval   Date for OT Re-Evaluation 10/18/23    Authorization Type [employer] Medicare, auth required    Progress Note Due on Visit 10    OT Start Time 1307    OT Stop Time 1400    OT Time Calculation (min) 53 min    Equipment Utilized During Treatment Fluidotherapy    Activity Tolerance Patient tolerated treatment well    Behavior During Therapy [facility] for tasks assessed/performed              Past Medical History:  Diagnosis Date   Allergic rhinitis 09/19/2006   Qualifier: Diagnosis of  By: Luz Brazen MD, Jessica     Allergy    Arthritis    Evett tarry stools    from iron pills   CHF (congestive heart failure) (HCC) 1997   Colon polyps 2005   COPD (chronic obstructive pulmonary disease) (HCC)    GERD (gastroesophageal reflux disease)    Heart attack (HCC) 1997 and 2001   Hyperlipidemia    Hypertension    Otomycosis of both ears 12/28/2019   Pneumonia 2012   Status post dilation of esophageal narrowing    TMJ dysfunction 03/27/2019   Past Surgical History:  Procedure Laterality Date   CESAREAN SECTION  01/1976   COLONOSCOPY     CORONARY ANGIOPLASTY WITH STENT PLACEMENT  2002   POLYPECTOMY     Patient Active Problem List   Diagnosis Date Noted   Osteoarthritis of both hands 04/11/2023   Dyshidrotic eczema 06/08/2022   Ganglion cyst of both wrists 06/08/2022   Nevus 01/19/2022   Vacuolar interface dermatitis 06/08/2021   Rash 02/17/2021   Hyponatremia 11/23/2020   Grief reaction 11/23/2020   Atypical chest pain 09/28/2020   Dry skin dermatitis 09/28/2020   Systolic murmur 06/30/2020   Hair loss 09/19/2017   Osteopenia 11/04/2014   Polyarthralgia  10/17/2012   COPD (chronic obstructive pulmonary disease) (HCC) 10/01/2011   Restrictive lung disease 10/01/2011   GERD (gastroesophageal reflux disease) 11/20/2006   HYPERCHOLESTEROLEMIA 09/19/2006   Smoker 09/19/2006   Essential hypertension 09/19/2006   Coronary atherosclerosis 09/19/2006   CLAUDICATION, INTERMITTENT 09/19/2006    ONSET DATE: 07/23/2023 (referral date)  REFERRING DIAG: M19.041,M19.042 (ICD-10-CM) - Osteoarthritis of both hands, unspecified osteoarthritis type   THERAPY DIAG:  Pain in joint of right hand  Pain in joint of left hand  Muscle weakness (generalized)  Other lack of coordination  Rationale for Evaluation and Treatment: Rehabilitation  SUBJECTIVE:   SUBJECTIVE STATEMENT:   Pt reported she has been doing her exercises about 4 times/day and feels like her hands are moving better ie) she can make a sandwich and spread the mayo, PB & jelly etc.  She also reported letting her arm dangle ie. like the pendulum and that makes her arm more relaxed (especially L shoulder).  Pt reports that she has still not received arthritis gloves and hopes they will arrive soon.  Pt accompanied by: self  PERTINENT HISTORY: hx of tobacco use and subsequent cessation, T2DM, HTN, OA, CAD, COPD   Per 07/23/23 referral notes: "Hand OA and right shoulder OA  Very difficult for her to do  everyday tasks, frequently gets ganglion cysts"  Per 07/12/23 MD Progress Notes: "Patient says her hand pain and swelling has gotten much worse. She is not able to open jars or use scissors or a knife. She is unable to hold a cup. She feels her fingers and wrists are more swollen. She also notes a "knot" on the dorsal surface of her Left hand and on the radial side of her right wrist. She says she has had knots like this before on her hands/wrist and they are very painful and go away on their own. She says celebrex has helped her hand pain and swelling previously. Says voltaren gel is not doing  enough at this time. She has tried splints but does not appreciate much relief. Has gone to rheumatology almost 5 years ago but feels her hand pain has gotten much worse now."   PRECAUTIONS: Fall   WEIGHT BEARING RESTRICTIONS: No  PAIN:  Are you having pain? Yes: NPRS scale: R - 6, L - 8/10 Pain location: L shoulder up to neck, right hand Pain description: sharp, stabbing Aggravating factors: morning time Relieving factors: afternoon time "usually not as bad"  FALLS: Has patient fallen in last 6 months? No  LIVING ENVIRONMENT: Lives with: lives alone Lives in: House/apartment Stairs: No Has following equipment at home: Dan Humphreys - 2 wheeled  PLOF: Pt reported symptoms worsened about 2 months ago "out of the blue," unsure of any causative factors. Pt reported 2 months ago, pt could complete ADL/IADL tasks, including grooming, dressing, driving, and household management.  PATIENT GOALS: "to get back some control of my hands"  OBJECTIVE:  Note: Objective measures were completed at Evaluation unless otherwise noted.  HAND DOMINANCE: Right  ADLs: Overall ADLs:  Eating: ind in P.M., avoids eating breakfast because increased difficulty with grasping objects in A.M. Grooming: Pt reported difficulty lifting arms overhead and cannot hold onto brush for grooming tasks. UB Dressing: ind with significantly extra time, avoiding buttons/zippers or use of large buttons LB Dressing: ind with slip-on shoes, elastic waist pants, extra time: "takes me 20 minutes to put on a pair of socks" Toileting: ind, some difficulty with wiping, per pt: limiting water intake and avoiding drinking as much water to avoid toileting tasks as frequently secondary to pain. Bathing: ind with loofah sponge, difficulty holding shower brush, pain with loofah sponge wrapped around pain Tub Shower transfers: ind Equipment:  tub/shower  IADLs: Shopping: daughter assists Light housekeeping: difficulty "I'm doing the best I  can." Meal Prep: ind for simple meals, unable to cut up items d/t difficulty grasping knife and pt concerns about safety Community mobility: daughter assists  Handwriting:  Pt reported "handwriting is not as good." Pt reported not frequently handwriting.  MOBILITY STATUS: Independent  POSTURE COMMENTS:  rounded shoulders and forward head Sitting balance:  ind  ACTIVITY TOLERANCE: Activity tolerance: Pt reported poor sleep quality d/t pain which limits energy.  FUNCTIONAL OUTCOME MEASURES: Quick Dash: 70.5% deficit    UPPER EXTREMITY ROM:    Active ROM Right eval Left eval  Shoulder flexion WFL (painful) Severe pain at approx. 60*, ultimately achieved Approx. 110* with report "like a knife sticking" in shoulder  Shoulder abduction WFL (painful, "it feels like it wants to pop")   Shoulder adduction    Shoulder extension    Shoulder internal rotation    Shoulder external rotation    Elbow flexion [facility] WFL  Elbow extension [facility] WFL though extremely hesitant with movement, limited by pain  Wrist flexion  30* 20*  Wrist extension 58* 5*  Wrist ulnar deviation    Wrist radial deviation    Wrist pronation [facility] WFL  Wrist supination WFL WFL  (Blank rows = not tested)  Pt demo'd slower, hesitant movements with BUE though especially with LUE.  AROM RUE opposition between thumb and pinky - 0 cm AROM LUE opposition between thumb and pinky - 0.5 cm AROM Composite fist LUE - approx. 3.5 cm between digits and palm, impaired AROM Composite fist RUE - approx. 2 cm between digits and palm, impaired  PROM composite fist BUE - impaired, similar flex of digits as compared to AROM  HAND FUNCTION: Tip pinch: Right 2 lbs, Left: 2 lbs  COORDINATION: 9-hold peg test: R: 36 seconds. L: 42 seconds.  Ataxic movements, large "shaking" movements of BUE.  SENSATION: Pt reported tingling/numbness at tips of digits 2-4 on R and digits 2-5 on L side.  EDEMA: Noted at joints   MUSCLE TONE:  RUE: Rigidity and LUE: Rigidity  COGNITION: Overall cognitive status: Within functional limits for tasks assessed  PRAXIS: Not tested  OBSERVATIONS: Pt ambulated ind without A/E though with unsteady gait. Pt severely limited by pain with BUE movements, more impairment and limitations on L side compared to R side. Pt demo'd skin sloughing off on ulnar side of R palm, and pt reported hx of eczema and "popping blisters" (fluid-filled) on R palm yesterday. Pt demo'd moderate swelling at several joints of digits of BUE.                                                                                                                           TREATMENT DATE:    Therapeutic Exercises   Pt engaged in shoulder ROM exercises to help with shoulder position for ADL care at head/face  - Reviewed Shoulder Pendulum Exercise  - Added the following with visual, tactile and verabal cues with return demonstration sought - Seated Shoulder Flexion Towel Slide at Table Top   - Seated Shoulder Scaption Slide at Table Top with Forearm in Neutral    Pt placed BUE in Fluidotherapy machine with supervised ROM x 10 min. Pt was educated to complete modified tendon gliding exercises during modality time to improve ROM and decrease pain/stiffness of affected extremity by use of the machine's massaging action and thermal properties. Upon completion of modality, pt reported significant improvement in hand mobility with decreased stiffness and swelling allowing increased FM activities afterward.  Therapeutic Activities  Initiated coordination Activities to work on B UE finger ROM, dexterity and isolated movements with demonstration and practice, as well as modification, hand over hand guidance and cues throughout to improve technique, digital isolation and ease of performing task.  Tasks included:  Picking up objects of different sizes ie) dice, checkers, coins, etc... To place in containers To pick up items one at a time  until patient got 5+ in her hand and then move item from palm to fingertips to release ie)  Finger-to-palm then palm-to-finger translation of small items - Options to vary difficulty include using a washcloth under items like coins or using larger items (checkers vs coins or blocks/dominos vs dice) for increased ease of picking up items.   Patient is encouraged to take breaks, relax arm/shoulder by supporting forearm, minimize compensatory motions and a try different activities throughout the day/week including games like Dorisann Frames (for the dice), card games, Connect 4 etc.   Patient benefited from extra time, verbal/tactile cues, and modeling of task to allow time for processing of verbal instructions and improve motor planning of unfamiliar movements.    PATIENT EDUCATION: Education details: Tendon Glides, sleep position, edema management Person educated: Patient Education method: Explanation, Demonstration, Tactile cues, Verbal cues, and Handouts Education comprehension: verbalized understanding, returned demonstration, verbal cues required, tactile cues required, and needs further education  HOME EXERCISE PROGRAM: 08/28/23: Tendon Gliding Exercises, Sleep positions and edema control 09/04/23: Shoulder ROM - table slides Access Code: [email or web address]   GOALS: Goals reviewed with patient? Yes  SHORT TERM GOALS: Target date: 09/20/23  Patient will demonstrate updated BUE HEP with no more than min v/c and visual handouts. Baseline: new to outpt OT Goal status: IN Progress  2.  Pt will ind recall wear and care schedule for splinting/gloves options. Baseline: No current splint/gloves. Pt recently ordered arthritis gloves which will arrive soon. Goal status: IN Progress  3.  Pt will recall at least 2 pain management strategies to decrease BUE pain. Baseline: new to outpt OT,   6/10 pain at rest which increases with BUE movement (worse on L side compared to R side) Goal status: IN Progress  4.  Pt  will verbalize understanding of sleep positioning strategies to protect BUE joints, decrease pain, and improve sleep quality. Baseline: new to outpt OT. Pt reported difficulty with sleep secondary to pain. Goal status: IN Progress  5.  Pt will recall at least 3 joint protection strategies. Baseline: new to outpt OT Goal status: IN Progress  6.  Pt will return demonstration of desensitization strategies to reduce pain/discomfort of BUE. Baseline: new to outpt OT Goal status: INITIAL  LONG TERM GOALS: Target date: 10/18/23  Using adaptive strategies and A/E PRN, pt will demo improved functional grasp and pinch with BUE to improve participation in ADL tasks. Baseline: Pt reported difficulty with grasping objects for ADL tasks, including but not limited to eating utensils, cup, brush, long-handled sponge, and pen. Goal status: INITIAL  3.  Pt will verbalize understanding of adaptive strategies and A/E to improve ind and decrease BUE pain for ADL bathing and toileting tasks. Baseline: Pt reported difficulty with bathing and toileting tasks secondary to BUE pain Goal status: INITIAL  4.  Patient will demonstrate at least 16% improvement with quick Dash score (reporting 54.5% disability or less) indicating improved functional use of affected extremity.  Baseline: Quick Dash: 70.5% deficit Goal status: INITIAL  5.  Patient will demo improved FM coordination as evidenced by completing nine-hole peg in 31 seconds or less with RUE and 37 seconds or less with LUE.  Baseline: 9-hold peg test: R: 36 seconds. L: 42 seconds. Goal status: INITIAL  ASSESSMENT:  CLINICAL IMPRESSION: Patient is a 78 y.o. female who was seen today for occupational therapy treatment for Osteoarthritis of both hands. Patient responded well to fluidotherapy treatment for decreasing pain and increasing mobility in B hands.  She continues to present below baseline level of function due to pain and discomfort in B UEs affecting  self care  and home management. Pt will benefit from skilled OT services in the outpatient setting to work on impairments including pain and decreased ROM/strength for increased ind for ADL/IADL tasks through improved understanding of adaptive strategies and A/E options to manage pain.  PERFORMANCE DEFICITS: in functional skills including ADLs, IADLs, coordination, dexterity, proprioception, sensation, edema, ROM, strength, pain, flexibility, Fine motor control, Gross motor control, mobility, balance, body mechanics, endurance, decreased knowledge of use of DME, and UE functional use, cognitive skills including  none , and psychosocial skills including environmental adaptation.   IMPAIRMENTS: are limiting patient from ADLs, IADLs, rest and sleep, leisure, and social participation.   CO-MORBIDITIES: may have co-morbidities  that affects occupational performance. Patient will benefit from skilled OT to address above impairments and improve overall function.  REHAB POTENTIAL: Good  PLAN:  OT FREQUENCY: 1x/week - OT recommended 2x per week though pt reported difficulty with transportation and therefore requested 1x per week.  OT DURATION: 8 weeks  PLANNED INTERVENTIONS: 97168 OT Re-evaluation, 97535 self care/ADL training, 29562 therapeutic exercise, 97530 therapeutic activity, 97112 neuromuscular re-education, 97140 manual therapy, 97035 ultrasound, 97018 paraffin, 13086 fluidotherapy, 97010 moist heat, 97010 cryotherapy, 97760 Orthotics management and training, 57846 Splinting (initial encounter), [id number] Subsequent splinting/medication, passive range of motion, functional mobility training, energy conservation, patient/family education, and DME and/or AE instructions  RECOMMENDED OTHER SERVICES: n/a  CONSULTED AND AGREED WITH PLAN OF CARE: Patient  PLAN FOR NEXT SESSION:   Did pt's arthritis gloves arrive? - Assess fit  HEP - continue tendon glides - monitor pain threshold, ?shoulder AAROM -  monitor pain threshold, FM coordination  A/E and adaptive strategies for bathing, toileting, grasping objects (built-up handles and universal cuff  for eating utensils , pen, hair brush, long-handled sponge; bath mit for bathing tasks)  Joint protection handout  Pain management handout - modalities - continue Fluido/trial paraffin   Desensitization handout   Victorino Sparrow, OT 09/04/2023, 3:51 PM

## 2023-09-04 NOTE — Patient Instructions (Signed)
Access Code: [email or web address] URL: [email or web address] Date: 09/04/2023 Prepared by: Amada Kingfisher  Exercises - Seated Shoulder Pendulum Exercise  - 1-3 x daily - 10 reps - Seated Shoulder Flexion Towel Slide at Table Top  - 2 x daily - 10 reps - Seated Shoulder Scaption Slide at Table Top with Forearm in Neutral  - 2 x daily - 10 reps

## 2023-09-11 ENCOUNTER — Ambulatory Visit: Payer: Medicare Other | Admitting: Occupational Therapy

## 2023-09-18 ENCOUNTER — Ambulatory Visit: Payer: Medicare Other | Admitting: Occupational Therapy

## 2023-09-18 DIAGNOSIS — M6281 Muscle weakness (generalized): Secondary | ICD-10-CM

## 2023-09-18 DIAGNOSIS — M25542 Pain in joints of left hand: Secondary | ICD-10-CM

## 2023-09-18 DIAGNOSIS — R278 Other lack of coordination: Secondary | ICD-10-CM

## 2023-09-18 DIAGNOSIS — M25541 Pain in joints of right hand: Secondary | ICD-10-CM | POA: Diagnosis not present

## 2023-09-18 DIAGNOSIS — R29898 Other symptoms and signs involving the musculoskeletal system: Secondary | ICD-10-CM | POA: Diagnosis not present

## 2023-09-18 NOTE — Therapy (Unsigned)
 OUTPATIENT OCCUPATIONAL THERAPY NEURO TREATMENT  Patient Name: [name] Page MRN: [id number] DOB:02-Mar-1946, 78 y.o., female Today's Date: 09/18/2023  PCP: Lockie Mola, MD REFERRING PROVIDER: Westley Chandler, MD   END OF SESSION:  OT End of Session - 09/18/23 1321     Visit Number 4    Number of Visits 9   including eval   Date for OT Re-Evaluation 10/18/23    Authorization Type [employer], auth required    Progress Note Due on Visit 10    OT Start Time 1405    OT Stop Time 1445    OT Time Calculation (min) 40 min    Equipment Utilized During Treatment Fluidotherapy    Activity Tolerance Patient tolerated treatment well    Behavior During Therapy [facility] for tasks assessed/performed              Past Medical History:  Diagnosis Date   Allergic rhinitis 09/19/2006   Qualifier: Diagnosis of  By: Luz Brazen MD, Jessica     Allergy    Arthritis    Beretta tarry stools    from iron pills   CHF (congestive heart failure) (HCC) 1997   Colon polyps 2005   COPD (chronic obstructive pulmonary disease) (HCC)    GERD (gastroesophageal reflux disease)    Heart attack (HCC) 1997 and 2001   Hyperlipidemia    Hypertension    Otomycosis of both ears 12/28/2019   Pneumonia 2012   Status post dilation of esophageal narrowing    TMJ dysfunction 03/27/2019   Past Surgical History:  Procedure Laterality Date   CESAREAN SECTION  01/1976   COLONOSCOPY     CORONARY ANGIOPLASTY WITH STENT PLACEMENT  2002   POLYPECTOMY     Patient Active Problem List   Diagnosis Date Noted   Osteoarthritis of both hands 04/11/2023   Dyshidrotic eczema 06/08/2022   Ganglion cyst of both wrists 06/08/2022   Nevus 01/19/2022   Vacuolar interface dermatitis 06/08/2021   Rash 02/17/2021   Hyponatremia 11/23/2020   Grief reaction 11/23/2020   Atypical chest pain 09/28/2020   Dry skin dermatitis 09/28/2020   Systolic murmur 06/30/2020   Hair loss 09/19/2017   Osteopenia 11/04/2014   Polyarthralgia  10/17/2012   COPD (chronic obstructive pulmonary disease) (HCC) 10/01/2011   Restrictive lung disease 10/01/2011   GERD (gastroesophageal reflux disease) 11/20/2006   HYPERCHOLESTEROLEMIA 09/19/2006   Smoker 09/19/2006   Essential hypertension 09/19/2006   Coronary atherosclerosis 09/19/2006   CLAUDICATION, INTERMITTENT 09/19/2006    ONSET DATE: 07/23/2023 (referral date)  REFERRING DIAG: M19.041,M19.042 (ICD-10-CM) - Osteoarthritis of both hands, unspecified osteoarthritis type   THERAPY DIAG:  Pain in joint of right hand  Pain in joint of left hand  Other lack of coordination  Muscle weakness (generalized)  Rationale for Evaluation and Treatment: Rehabilitation  SUBJECTIVE:   SUBJECTIVE STATEMENT:   Pt reported she has been doing her exercises and moving her hands has really helped her hands feel and move better ie) she can almost make a fist.  Pt reports that she has still not received arthritis gloves and then indicated that she had not ordered any.  Pt accompanied by: self  PERTINENT HISTORY: hx of tobacco use and subsequent cessation, T2DM, HTN, OA, CAD, COPD   Per 07/23/23 referral notes: "Hand OA and right shoulder OA  Very difficult for her to do everyday tasks, frequently gets ganglion cysts"  Per 07/12/23 MD Progress Notes: "Patient says her hand pain and swelling has gotten much worse. She  is not able to open jars or use scissors or a knife. She is unable to hold a cup. She feels her fingers and wrists are more swollen. She also notes a "knot" on the dorsal surface of her Left hand and on the radial side of her right wrist. She says she has had knots like this before on her hands/wrist and they are very painful and go away on their own. She says celebrex has helped her hand pain and swelling previously. Says voltaren gel is not doing enough at this time. She has tried splints but does not appreciate much relief. Has gone to rheumatology almost 5 years ago but  feels her hand pain has gotten much worse now."   PRECAUTIONS: Fall   WEIGHT BEARING RESTRICTIONS: No  PAIN:  Are you having pain? Yes: NPRS scale: R - 6, L - 8/10 Pain location: L shoulder up to neck, right hand Pain description: sharp, stabbing Aggravating factors: morning time Relieving factors: afternoon time "usually not as bad"  FALLS: Has patient fallen in last 6 months? No  LIVING ENVIRONMENT: Lives with: lives alone Lives in: House/apartment Stairs: No Has following equipment at home: Dan Humphreys - 2 wheeled  PLOF: Pt reported symptoms worsened about 2 months ago "out of the blue," unsure of any causative factors. Pt reported 2 months ago, pt could complete ADL/IADL tasks, including grooming, dressing, driving, and household management.  PATIENT GOALS: "to get back some control of my hands"  OBJECTIVE:  Note: Objective measures were completed at Evaluation unless otherwise noted.  HAND DOMINANCE: Right  ADLs: Overall ADLs:  Eating: ind in P.M., avoids eating breakfast because increased difficulty with grasping objects in A.M. Grooming: Pt reported difficulty lifting arms overhead and cannot hold onto brush for grooming tasks. UB Dressing: ind with significantly extra time, avoiding buttons/zippers or use of large buttons LB Dressing: ind with slip-on shoes, elastic waist pants, extra time: "takes me 20 minutes to put on a pair of socks" Toileting: ind, some difficulty with wiping, per pt: limiting water intake and avoiding drinking as much water to avoid toileting tasks as frequently secondary to pain. Bathing: ind with loofah sponge, difficulty holding shower brush, pain with loofah sponge wrapped around pain Tub Shower transfers: ind Equipment:  tub/shower  IADLs: Shopping: daughter assists Light housekeeping: difficulty "I'm doing the best I can." Meal Prep: ind for simple meals, unable to cut up items d/t difficulty grasping knife and pt concerns about  safety Community mobility: daughter assists  Handwriting:  Pt reported "handwriting is not as good." Pt reported not frequently handwriting.  MOBILITY STATUS: Independent  POSTURE COMMENTS:  rounded shoulders and forward head Sitting balance:  ind  ACTIVITY TOLERANCE: Activity tolerance: Pt reported poor sleep quality d/t pain which limits energy.  FUNCTIONAL OUTCOME MEASURES: Quick Dash: 70.5% deficit    UPPER EXTREMITY ROM:    Active ROM Right eval Left eval  Shoulder flexion WFL (painful) Severe pain at approx. 60*, ultimately achieved Approx. 110* with report "like a knife sticking" in shoulder  Shoulder abduction WFL (painful, "it feels like it wants to pop")   Shoulder adduction    Shoulder extension    Shoulder internal rotation    Shoulder external rotation    Elbow flexion [facility] WFL  Elbow extension [facility] WFL though extremely hesitant with movement, limited by pain  Wrist flexion 30* 20*  Wrist extension 58* 5*  Wrist ulnar deviation    Wrist radial deviation    Wrist pronation [facility]  Wrist supination WFL WFL  (Blank rows = not tested)  Pt demo'd slower, hesitant movements with BUE though especially with LUE.  AROM RUE opposition between thumb and pinky - 0 cm AROM LUE opposition between thumb and pinky - 0.5 cm AROM Composite fist LUE - approx. 3.5 cm between digits and palm, impaired AROM Composite fist RUE - approx. 2 cm between digits and palm, impaired  PROM composite fist BUE - impaired, similar flex of digits as compared to AROM  HAND FUNCTION: Tip pinch: Right 2 lbs, Left: 2 lbs  COORDINATION: 9-hold peg test: R: 36 seconds. L: 42 seconds.  Ataxic movements, large "shaking" movements of BUE.  SENSATION: Pt reported tingling/numbness at tips of digits 2-4 on R and digits 2-5 on L side.  EDEMA: Noted at joints   MUSCLE TONE: RUE: Rigidity and LUE: Rigidity  COGNITION: Overall cognitive status: Within functional limits for tasks  assessed  PRAXIS: Not tested  OBSERVATIONS: Pt ambulated ind without A/E though with unsteady gait. Pt severely limited by pain with BUE movements, more impairment and limitations on L side compared to R side. Pt demo'd skin sloughing off on ulnar side of R palm, and pt reported hx of eczema and "popping blisters" (fluid-filled) on R palm yesterday. Pt demo'd moderate swelling at several joints of digits of BUE.                                                                                                                           TREATMENT DATE:    Therapeutic Exercises   Pt placed BUE in Fluidotherapy machine with supervised ROM x 10 min. Pt was educated to complete modified tendon gliding exercises during modality time to improve ROM and decrease pain/stiffness of affected extremity by use of the machine's massaging action and thermal properties. Upon completion of modality, pt reported good improvement in hand mobility, decreased stiffness and swelling allowing increased FM activities afterward.  Therapeutic Activities  Reviewed and added additional coordination Activities to education provided last week with handout provided to pt also.  Pt engaged in various activities to work on B UE finger ROM, dexterity and isolated movements with demonstration and practice, as well as modification, hand over hand guidance and cues throughout to improve technique, digital isolation and ease of performing task.  Tasks included:  Picking up objects of different sizes ie) dice, checkers, coins, etc... To place in containers To pick up items one at a time until patient got 5+ in her hand and then move item from palm to fingertips to release ie) Finger-to-palm then palm-to-finger translation of small items - Options to vary difficulty include using a washcloth under items like coins or using larger items (checkers vs coins or blocks/dominos vs dice) for increased ease of picking up items.  Shuffling,  Flipping and dealing cards 1 at a time. -- Setup patient to work on sorting cards, focusing on using index finger with thumb to flip cards or holding deck  of cards in palm of hand and push off 1 card at a time from the top of the deck using only thumb    Rotate golf balls (clockwise and counter-clockwise) with forearm pronated and balls on table or supinated and balls in hand.  Pt had increased ease with balls atop the table.   Twirl pen/cil between fingers. - Encouragement to isolate fingers individually and "twirl" (rotation) or flipping and shift up and down the pen (translation) to get it in position for writing or erasing.    Tear a piece of paper towel and roll it into small balls with fingertips ie) straw wrapping when eating out.    Patient is encouraged to take breaks, relax arm/shoulder by supporting forearm, minimize compensatory motions and a try different activities throughout the day/week including games like Dorisann Frames (for the dice), card games, Connect 4, puzzles etc.   Patient benefited from extra time, verbal/tactile cues, and modeling of task to allow time for processing of verbal instructions and improve motor planning of unfamiliar movements.     PATIENT EDUCATION: Education details: Coordination activities Person educated: Patient Education method: Explanation, Demonstration, Tactile cues, Verbal cues, and Handouts Education comprehension: verbalized understanding, returned demonstration, verbal cues required, tactile cues required, and needs further education  HOME EXERCISE PROGRAM: 08/28/23: Tendon Gliding Exercises, Sleep positions and edema control 09/04/23: Shoulder ROM - table slides Access Code: [email or web address] 09/18/23: Coordination Activities   GOALS: Goals reviewed with patient? Yes  SHORT TERM GOALS: Target date: 09/20/23  Patient will demonstrate updated BUE HEP with no more than min v/c and visual handouts. Baseline: new to outpt OT Goal status: IN Progress  2.   Pt will ind recall wear and care schedule for splinting/gloves options. Baseline: No current splint/gloves. Pt recently ordered arthritis gloves which will arrive soon. Goal status: IN Progress  3.  Pt will recall at least 2 pain management strategies to decrease BUE pain. Baseline: new to outpt OT,   6/10 pain at rest which increases with BUE movement (worse on L side compared to R side) Goal status: IN Progress  4.  Pt will verbalize understanding of sleep positioning strategies to protect BUE joints, decrease pain, and improve sleep quality. Baseline: new to outpt OT. Pt reported difficulty with sleep secondary to pain. Goal status: IN Progress  5.  Pt will recall at least 3 joint protection strategies. Baseline: new to outpt OT Goal status: IN Progress  6.  Pt will return demonstration of desensitization strategies to reduce pain/discomfort of BUE. Baseline: new to outpt OT Goal status: IN Progress  LONG TERM GOALS: Target date: 10/18/23  Using adaptive strategies and A/E PRN, pt will demo improved functional grasp and pinch with BUE to improve participation in ADL tasks. Baseline: Pt reported difficulty with grasping objects for ADL tasks, including but not limited to eating utensils, cup, brush, long-handled sponge, and pen. Goal status: IN Progress  3.  Pt will verbalize understanding of adaptive strategies and A/E to improve ind and decrease BUE pain for ADL bathing and toileting tasks. Baseline: Pt reported difficulty with bathing and toileting tasks secondary to BUE pain Goal status: IN Progress  4.  Patient will demonstrate at least 16% improvement with quick Dash score (reporting 54.5% disability or less) indicating improved functional use of affected extremity.  Baseline: Quick Dash: 70.5% deficit Goal status: IN Progress  5.  Patient will demo improved FM coordination as evidenced by completing nine-hole peg in 31 seconds or less with RUE and 37  seconds or less with  LUE.  Baseline: 9-hold peg test: R: 36 seconds. L: 42 seconds. Goal status: IN Progress  ASSESSMENT:  CLINICAL IMPRESSION: Patient is a 78 y.o. female who was seen today for occupational therapy treatment for Osteoarthritis of both hands. Patient responded well to fluidotherapy treatment again today for decreasing pain and increasing mobility in B hands.  She was able to participate in various coordination activities and seems to have improvements with mobility and use of hands through simple motion with functional tasks. Pt will benefit from skilled OT services in the outpatient setting to work on impairments including pain and decreased ROM/strength for increased ind for ADL/IADL tasks through improved understanding of adaptive strategies and A/E options to manage pain.  PERFORMANCE DEFICITS: in functional skills including ADLs, IADLs, coordination, dexterity, proprioception, sensation, edema, ROM, strength, pain, flexibility, Fine motor control, Gross motor control, mobility, balance, body mechanics, endurance, decreased knowledge of use of DME, and UE functional use, cognitive skills including  none , and psychosocial skills including environmental adaptation.   IMPAIRMENTS: are limiting patient from ADLs, IADLs, rest and sleep, leisure, and social participation.   CO-MORBIDITIES: may have co-morbidities  that affects occupational performance. Patient will benefit from skilled OT to address above impairments and improve overall function.  REHAB POTENTIAL: Good  PLAN:  OT FREQUENCY: 1x/week - OT recommended 2x per week though pt reported difficulty with transportation and therefore requested 1x per week.  OT DURATION: 8 weeks  PLANNED INTERVENTIONS: 97168 OT Re-evaluation, 97535 self care/ADL training, 40981 therapeutic exercise, 97530 therapeutic activity, 97112 neuromuscular re-education, 97140 manual therapy, 97035 ultrasound, 97018 paraffin, 19147 fluidotherapy, 97010 moist heat, 97010  cryotherapy, 97760 Orthotics management and training, 82956 Splinting (initial encounter), [phone] Subsequent splinting/medication, passive range of motion, functional mobility training, energy conservation, patient/family education, and DME and/or AE instructions  RECOMMENDED OTHER SERVICES: n/a  CONSULTED AND AGREED WITH PLAN OF CARE: Patient  PLAN FOR NEXT SESSION:   Arthritis gloves recommendations - pt did not order  HEP - continue tendon glides - monitor pain threshold, ?shoulder AAROM - monitor pain threshold, FM coordination  A/E and adaptive strategies for bathing, toileting, grasping objects (built-up handles and universal cuff  for eating utensils , pen, hair brush, long-handled sponge; bath mit for bathing tasks)  Joint protection handout  Pain management handout - modalities - continue Fluido/trial paraffin   Desensitization handout   Victorino Sparrow, OT 09/18/2023, 5:43 PM

## 2023-09-25 ENCOUNTER — Ambulatory Visit: Payer: Medicare Other | Attending: Family Medicine | Admitting: Occupational Therapy

## 2023-09-25 DIAGNOSIS — M25542 Pain in joints of left hand: Secondary | ICD-10-CM | POA: Diagnosis not present

## 2023-09-25 DIAGNOSIS — R29898 Other symptoms and signs involving the musculoskeletal system: Secondary | ICD-10-CM | POA: Diagnosis not present

## 2023-09-25 DIAGNOSIS — M25541 Pain in joints of right hand: Secondary | ICD-10-CM | POA: Diagnosis not present

## 2023-09-25 DIAGNOSIS — M6281 Muscle weakness (generalized): Secondary | ICD-10-CM | POA: Diagnosis not present

## 2023-09-25 DIAGNOSIS — R278 Other lack of coordination: Secondary | ICD-10-CM | POA: Diagnosis not present

## 2023-09-25 DIAGNOSIS — R208 Other disturbances of skin sensation: Secondary | ICD-10-CM | POA: Diagnosis not present

## 2023-09-25 NOTE — Therapy (Signed)
 OUTPATIENT OCCUPATIONAL THERAPY NEURO TREATMENT  Patient Name: [name] Page MRN: [id number] DOB:22-Nov-1945, 78 y.o., female Today's Date: 09/25/2023  PCP: Lockie Mola, MD REFERRING PROVIDER: Westley Chandler, MD   END OF SESSION:  OT End of Session - 09/25/23 1320     Visit Number 5    Number of Visits 9   including eval   Date for OT Re-Evaluation 10/18/23    Authorization Type [employer] Medicare, auth required    Progress Note Due on Visit 10    OT Start Time 1319    OT Stop Time 1401    OT Time Calculation (min) 42 min    Activity Tolerance Patient tolerated treatment well    Behavior During Therapy WFL for tasks assessed/performed            Past Medical History:  Diagnosis Date   Allergic rhinitis 09/19/2006   Qualifier: Diagnosis of  By: Luz Brazen MD, Jessica     Allergy    Arthritis    Scheier tarry stools    from iron pills   CHF (congestive heart failure) (HCC) 1997   Colon polyps 2005   COPD (chronic obstructive pulmonary disease) (HCC)    GERD (gastroesophageal reflux disease)    Heart attack (HCC) 1997 and 2001   Hyperlipidemia    Hypertension    Otomycosis of both ears 12/28/2019   Pneumonia 2012   Status post dilation of esophageal narrowing    TMJ dysfunction 03/27/2019   Past Surgical History:  Procedure Laterality Date   CESAREAN SECTION  01/1976   COLONOSCOPY     CORONARY ANGIOPLASTY WITH STENT PLACEMENT  2002   POLYPECTOMY     Patient Active Problem List   Diagnosis Date Noted   Osteoarthritis of both hands 04/11/2023   Dyshidrotic eczema 06/08/2022   Ganglion cyst of both wrists 06/08/2022   Nevus 01/19/2022   Vacuolar interface dermatitis 06/08/2021   Rash 02/17/2021   Hyponatremia 11/23/2020   Grief reaction 11/23/2020   Atypical chest pain 09/28/2020   Dry skin dermatitis 09/28/2020   Systolic murmur 06/30/2020   Hair loss 09/19/2017   Osteopenia 11/04/2014   Polyarthralgia 10/17/2012   COPD (chronic obstructive pulmonary disease)  (HCC) 10/01/2011   Restrictive lung disease 10/01/2011   GERD (gastroesophageal reflux disease) 11/20/2006   HYPERCHOLESTEROLEMIA 09/19/2006   Smoker 09/19/2006   Essential hypertension 09/19/2006   Coronary atherosclerosis 09/19/2006   CLAUDICATION, INTERMITTENT 09/19/2006    ONSET DATE: 07/23/2023 (referral date)  REFERRING DIAG: M19.041,M19.042 (ICD-10-CM) - Osteoarthritis of both hands, unspecified osteoarthritis type   THERAPY DIAG:  Pain in joint of right hand  Pain in joint of left hand  Other lack of coordination  Muscle weakness (generalized)  Other symptoms and signs involving the musculoskeletal system  Other disturbances of skin sensation  Rationale for Evaluation and Treatment: Rehabilitation  SUBJECTIVE:   SUBJECTIVE STATEMENT:   Pt reported she has been wearing her arthritis gloves for 4 days with some improvement.   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   Pt accompanied by: self  PERTINENT HISTORY: hx of tobacco use and subsequent cessation, T2DM, HTN, OA, CAD, COPD   Per 07/23/23 referral notes: "Hand OA and right shoulder OA  Very difficult for her to do everyday tasks, frequently gets ganglion cysts"  Per 07/12/23 MD Progress Notes: "Patient says her hand pain and swelling has gotten much worse. She is not able to open jars or use  scissors or a knife. She is unable to hold a cup. She feels her fingers and wrists are more swollen. She also notes a "knot" on the dorsal surface of her Left hand and on the radial side of her right wrist. She says she has had knots like this before on her hands/wrist and they are very painful and go away on their own. She says celebrex has helped her hand pain and swelling previously. Says voltaren gel is not doing enough at this time. She has tried splints but does not appreciate much relief. Has gone to rheumatology almost 5 years ago but feels her hand pain has gotten much  worse now."   PRECAUTIONS: Fall   WEIGHT BEARING RESTRICTIONS: No  PAIN:  Are you having pain? Yes: NPRS scale: R - 4, L - 4/10 Pain location: L shoulder up to neck, right hand Pain description: sharp, stabbing Aggravating factors: morning time Relieving factors: afternoon time "usually not as bad"  FALLS: Has patient fallen in last 6 months? No  LIVING ENVIRONMENT: Lives with: lives alone Lives in: House/apartment Stairs: No Has following equipment at home: Dan Humphreys - 2 wheeled  PLOF: Pt reported symptoms worsened about 2 months ago "out of the blue," unsure of any causative factors. Pt reported 2 months ago, pt could complete ADL/IADL tasks, including grooming, dressing, driving, and household management.  PATIENT GOALS: "to get back some control of my hands"  OBJECTIVE:  Note: Objective measures were completed at Evaluation unless otherwise noted.  HAND DOMINANCE: Right  ADLs: Overall ADLs:  Eating: ind in P.M., avoids eating breakfast because increased difficulty with grasping objects in A.M. Grooming: Pt reported difficulty lifting arms overhead and cannot hold onto brush for grooming tasks. UB Dressing: ind with significantly extra time, avoiding buttons/zippers or use of large buttons LB Dressing: ind with slip-on shoes, elastic waist pants, extra time: "takes me 20 minutes to put on a pair of socks" Toileting: ind, some difficulty with wiping, per pt: limiting water intake and avoiding drinking as much water to avoid toileting tasks as frequently secondary to pain. Bathing: ind with loofah sponge, difficulty holding shower brush, pain with loofah sponge wrapped around pain Tub Shower transfers: ind Equipment:  tub/shower  IADLs: Shopping: daughter assists Light housekeeping: difficulty "I'm doing the best I can." Meal Prep: ind for simple meals, unable to cut up items d/t difficulty grasping knife and pt concerns about safety Community mobility: daughter  assists  Handwriting:  Pt reported "handwriting is not as good." Pt reported not frequently handwriting.  MOBILITY STATUS: Independent  POSTURE COMMENTS:  rounded shoulders and forward head Sitting balance:  ind  ACTIVITY TOLERANCE: Activity tolerance: Pt reported poor sleep quality d/t pain which limits energy.  FUNCTIONAL OUTCOME MEASURES: Quick Dash: 70.5% deficit    UPPER EXTREMITY ROM:    Active ROM Right eval Left eval  Shoulder flexion WFL (painful) Severe pain at approx. 60*, ultimately achieved Approx. 110* with report "like a knife sticking" in shoulder  Shoulder abduction WFL (painful, "it feels like it wants to pop")   Shoulder adduction    Shoulder extension    Shoulder internal rotation    Shoulder external rotation    Elbow flexion [facility] WFL  Elbow extension [facility] WFL though extremely hesitant with movement, limited by pain  Wrist flexion 30* 20*  Wrist extension 58* 5*  Wrist ulnar deviation    Wrist radial deviation    Wrist pronation [facility] WFL  Wrist supination WFL WFL  (Blank rows =  not tested)  Pt demo'd slower, hesitant movements with BUE though especially with LUE.  AROM RUE opposition between thumb and pinky - 0 cm AROM LUE opposition between thumb and pinky - 0.5 cm AROM Composite fist LUE - approx. 3.5 cm between digits and palm, impaired AROM Composite fist RUE - approx. 2 cm between digits and palm, impaired  PROM composite fist BUE - impaired, similar flex of digits as compared to AROM  HAND FUNCTION: Tip pinch: Right 2 lbs, Left: 2 lbs  COORDINATION: 9-hold peg test: R: 36 seconds. L: 42 seconds.  Ataxic movements, large "shaking" movements of BUE.  SENSATION: Pt reported tingling/numbness at tips of digits 2-4 on R and digits 2-5 on L side.  EDEMA: Noted at joints   MUSCLE TONE: RUE: Rigidity and LUE: Rigidity  COGNITION: Overall cognitive status: Within functional limits for tasks assessed  PRAXIS: Not tested  OBSERVATIONS:  Pt ambulated ind without A/E though with unsteady gait. Pt severely limited by pain with BUE movements, more impairment and limitations on L side compared to R side. Pt demo'd skin sloughing off on ulnar side of R palm, and pt reported hx of eczema and "popping blisters" (fluid-filled) on R palm yesterday. Pt demo'd moderate swelling at several joints of digits of BUE.                                                                                                                           TREATMENT:    - Ultrasound completed for duration as noted below including:  Ultrasound applied to palmar and dorsal bilateral hand, digits, and wrist for 16 minutes, frequency of 3 MHz, 20% duty cycle, and 1.1 W/cm with pt's arm placed on soft towel for promotion of ROM, edema reduction, and pain reduction in affected extremity. OT educated pt on joint protection, ergonomics, and Optometrist principles as noted in pt instructions as needed to improve UE pain.  PATIENT EDUCATION: Education details: Coordination activities Person educated: Patient Education method: Explanation, Demonstration, Tactile cues, Verbal cues, and Handouts Education comprehension: verbalized understanding, returned demonstration, verbal cues required, tactile cues required, and needs further education  HOME EXERCISE PROGRAM: 08/28/23: Tendon Gliding Exercises, Sleep positions and edema control 09/04/23: Shoulder ROM - table slides Access Code: [email or web address] 09/18/23: Coordination Activities   GOALS: Goals reviewed with patient? Yes  SHORT TERM GOALS: Target date: 09/20/23  Patient will demonstrate updated BUE HEP with no more than min v/c and visual handouts. Baseline: new to outpt OT Goal status: IN Progress  2.  Pt will ind recall wear and care schedule for splinting/gloves options. Baseline: No current splint/gloves. Pt recently ordered arthritis gloves which will arrive soon. Goal status: IN Progress  3.  Pt will recall at  least 2 pain management strategies to decrease BUE pain. Baseline: new to outpt OT,   6/10 pain at rest which increases with BUE movement (worse on L side compared to R side) Goal status: IN Progress  4.  Pt will verbalize understanding of sleep positioning strategies to protect BUE joints, decrease pain, and improve sleep quality. Baseline: new to outpt OT. Pt reported difficulty with sleep secondary to pain. Goal status: IN Progress  5.  Pt will recall at least 3 joint protection strategies. Baseline: new to outpt OT Goal status: IN Progress  6.  Pt will return demonstration of desensitization strategies to reduce pain/discomfort of BUE. Baseline: new to outpt OT Goal status: IN Progress  LONG TERM GOALS: Target date: 10/18/23  Using adaptive strategies and A/E PRN, pt will demo improved functional grasp and pinch with BUE to improve participation in ADL tasks. Baseline: Pt reported difficulty with grasping objects for ADL tasks, including but not limited to eating utensils, cup, brush, long-handled sponge, and pen. Goal status: IN Progress  3.  Pt will verbalize understanding of adaptive strategies and A/E to improve ind and decrease BUE pain for ADL bathing and toileting tasks. Baseline: Pt reported difficulty with bathing and toileting tasks secondary to BUE pain Goal status: IN Progress  4.  Patient will demonstrate at least 16% improvement with quick Dash score (reporting 54.5% disability or less) indicating improved functional use of affected extremity.  Baseline: Quick Dash: 70.5% deficit Goal status: IN Progress  5.  Patient will demo improved FM coordination as evidenced by completing nine-hole peg in 31 seconds or less with RUE and 37 seconds or less with LUE.  Baseline: 9-hold peg test: R: 36 seconds. L: 42 seconds. Goal status: IN Progress  ASSESSMENT:  CLINICAL IMPRESSION: Patient verbalizes good understanding of recommendations as needed to improve pain, joint  integrity, and overall functional use. Would recommend additional [place] for B edema/pain management and or taping.   PERFORMANCE DEFICITS: in functional skills including ADLs, IADLs, coordination, dexterity, proprioception, sensation, edema, ROM, strength, pain, flexibility, Fine motor control, Gross motor control, mobility, balance, body mechanics, endurance, decreased knowledge of use of DME, and UE functional use, cognitive skills including  none , and psychosocial skills including environmental adaptation.   IMPAIRMENTS: are limiting patient from ADLs, IADLs, rest and sleep, leisure, and social participation.   CO-MORBIDITIES: may have co-morbidities  that affects occupational performance. Patient will benefit from skilled OT to address above impairments and improve overall function.  REHAB POTENTIAL: Good  PLAN:  OT FREQUENCY: 1x/week - OT recommended 2x per week though pt reported difficulty with transportation and therefore requested 1x per week.  OT DURATION: 8 weeks  PLANNED INTERVENTIONS: 97168 OT Re-evaluation, 97535 self care/ADL training, 16109 therapeutic exercise, 97530 therapeutic activity, 97112 neuromuscular re-education, 97140 manual therapy, 97035 ultrasound, 97018 paraffin, 60454 fluidotherapy, 97010 moist heat, 97010 cryotherapy, 97760 Orthotics management and training, 09811 Splinting (initial encounter), [id number] Subsequent splinting/medication, passive range of motion, functional mobility training, energy conservation, patient/family education, and DME and/or AE instructions  RECOMMENDED OTHER SERVICES: n/a  CONSULTED AND AGREED WITH PLAN OF CARE: Patient  PLAN FOR NEXT SESSION: [place]? Taping?   HEP - continue tendon glides - monitor pain threshold, ?shoulder AAROM - monitor pain threshold, FM coordination  A/E and adaptive strategies for bathing, toileting, grasping objects (built-up handles and universal cuff  for eating utensils , pen, hair brush, long-handled sponge;  bath mit for bathing tasks)  Review Joint protection handout as needed  Pain management handout - modalities - continue Fluido/trial paraffin   Desensitization handout   Delana Meyer, OT 09/25/2023, 3:41 PM

## 2023-09-25 NOTE — Patient Instructions (Addendum)
 X03224 (8/07) - Page 1 of 6 [facility]  Rehabilitation and Sports Medicine Services Joint Protection Principles Therapist Phone  Joint protection principles are a series of techniques which can be included into all activities. This will reduce the stress on your joints. Joints that have been weakened by arthritis are at risk of being damaged by stress and strain. Improper use of diseased joints may lead to impaired function and deformity. Joint protection techniques are ways of doing activities so that the risk of deformity is decreased. 1. Respect For Pain Stop activities before you reach the point of discomfort or pain. Limit activities which cause your pain to last more than one hour after you have stopped the activity. 2. Balance Activity And Rest Rest before becoming tired. Plan rest periods during longer or more difficult activities. By resting 10 minutes during an activity, you will have more energy to continue. 3. Avoid Activities Which Cannot Be Stopped When you begin to feel joint pain, stop. This will eliminate excessive pain and fatigue later. Prioritize activities. Consider the activity, length of time, and difficulty before beginning. Plan difficult activities for "peak" energy times. 4. Use Larger, Stronger Joints For Activities, When Possible, Distributing The Weight Over Non-involved Or Stronger Joints.  To lift a bag from a counter, bend your knees, hug the bag with both arms. Bend your elbows so that the bag is held tightly to your chest and straighten your knees. Keep hold on the bag by keeping your elbows bent. If the load is too heavy, push shopping cart, or get help with groceries - use drive-up service.  You can use your hip to push open doors, and your feet to close lower drawers. OVER  X03224 (8/07) - Page 2 of 6  An envelope briefcase with a snap lock can be used rather than an attache case. By bending the elbows, the case can be carried under the arm so that  the case rests on the forearm. Hold the case by resting your arm against your body. Switch the case from one side of the body to the other.  Use the larger joints (elbow or shoulder) to carry the weight of the purse.  Wrong: The weight of the purse is all on the weak fingers.  Wing faucets: Keeping wrists extended, use the palm of your left hand to turn on a left faucet and the back of your left hand to turn the faucet off.  Four-pronged or circular faucets: Place palm of hand on top of the faucet keeping fingers extended. Straighten your elbow and apply a downward force on faucet, pushing from your shoulder. Keeping fingers and elbow extended, turn your arm inward toward your thumb. Right: The stronger elbow should carry the weight of the purse. CONTINUED ON NEXT PAGE X03224 (8/07) - Page 3 of 6  Wrong: Do not use fingers to lift heavy roasting pans or dishes.  Wrong: All the weight of the pot would on your weak fingers. 5. Avoid Staying In One Position For Extended Periods Of Time. Plan rest periods. Change your position. Stretch and relax your joints. 6. Maintain Or Use Your Joints In Good Alignment. Maintain proper posture.  This is good alignment. Right: Use oven mitts and lift with palms, using the stronger wrists and elbows to do the work. Right: Pick up the pot with two hands, using your palms. Avoid or change activities that cause your fingers to move towards the little finger side of your hand. OVER X03224 (8/07) -   Page 4 of 6  Use the palms of your hands for lifting and pushing. Push instead of pulling. 7. Maintain Proper Weight. Additional weight can stress weight-bearing joints (hip, knees, feet, back). Special Considerations For The Hands  1. AVOID TIGHT GRASP. Use a relaxed grip. Enlarge handles.  Place palm of hand on jar lid, and using weight if body, turn arm at shoulder to open jar. A sponge or wet towel under the jar prevents sliding.  2. AVOID PRESSURE ON BACK OF  KNUCKLES (MP JOINTS).  Wrong Right When rising from chair or bed. Dishwashing should be done with fingers kept straight as much as possible. It a dishwasher is available, use it in preference to washing by hand. Hold the knife or mixing spoon like a dagger, with the handle parallel to knuckles. Cutting is then changed from sawing to pulling. CONTINUED ON NEXT PAGE X03224 (8/07) - Page 5 of 6  3. USE BOTH HANDS WHEN POSSIBLE  4. AVOID REPETITIVE HAND ACTIVITIES Take breaks Change activity, i.e. using screwdriver, crocheting.  5. AVOID PRESSURE TO TIP OF THUMB Example: pushing snaps together, opening car doors, ringing doorbells.  To protect thumb joints, open milk containers with heels of the hands rather than thumbs. PostureWhether walking, standing, sitting or even sleeping, good posture is important for people with arthritis. Poor posture can make arthritis worse. As for standing, you should stand straight, head high, shoulders back, stomach in, and hips and knees straight. WalkingWalk erect, as in standing position. Arm swing freely at sides; let your weight shift easily from side to the other. Don't carry heavy packages in one hand. A lightweight shoulder bag is a good idea. If legs or knees are involved, a cane will make walking easier. Ring top cans: Hold the can with one hand. With the other hand, place a knife through the ring with handle of knife directly over the opening. Using the palm of your hand, push down on the handle of the knife. OVER X03224 (8/07) - Page 6 of 6  Resting/SleepingPatients with rheumatoid arthritis should avoid bent knees or arms. Lie straight at sides, knees and hips straight. Use a firm mattress or put plywood board between mattress and bedspring. If you need a pillow under your head, use a thin one. Keep sheets and blankets loose over your feet, perhaps by using a blanket support. If your arthritis is in your back, you may need a different position for sleeping.  Ask your doctor. SittingKeep good posture when sitting down. Use straight-back armchairs with firm seats. Sit with head up, shoulders back, stomach in, feet flat on floor. Use arms of chair to stand up slowly. References AOTA's, Workbook for Consumers with R.A.   Cordery, Joy Cumberland, M.A.O.T., OTR, "Joint Protection - A Responsibility for the Occupational Therapist," American Journal of Occupational Therapy, XIX, %, 1965.   "Joint Protection," Occupational Therapy Department, [facility], [place], [place].   Melvin, Jeanne: Rheumatic Disease Occupational Therapy and Rehab, 1977.   "Principles of Joint Protection," Occupational Therapy Department, [facility], [place], [place].   [employer], 1978.   Watkins, RuthAnn, OTR, and Robinson, Dianne, OTR, "Joint Preservation Techniques for Patients with Rheumatoid Arthritis," Department of Occupational Therapy, [facility] [place], [place], [place]. 

## 2023-09-30 ENCOUNTER — Other Ambulatory Visit: Payer: Self-pay

## 2023-09-30 DIAGNOSIS — I10 Essential (primary) hypertension: Secondary | ICD-10-CM

## 2023-09-30 MED ORDER — LOSARTAN POTASSIUM 100 MG PO TABS: ORAL_TABLET | ORAL | 0 refills | Status: DC

## 2023-10-02 ENCOUNTER — Ambulatory Visit: Payer: Medicare Other | Admitting: Occupational Therapy

## 2023-10-02 DIAGNOSIS — R29898 Other symptoms and signs involving the musculoskeletal system: Secondary | ICD-10-CM | POA: Diagnosis not present

## 2023-10-02 DIAGNOSIS — M25541 Pain in joints of right hand: Secondary | ICD-10-CM | POA: Diagnosis not present

## 2023-10-02 DIAGNOSIS — R208 Other disturbances of skin sensation: Secondary | ICD-10-CM | POA: Diagnosis not present

## 2023-10-02 DIAGNOSIS — M25542 Pain in joints of left hand: Secondary | ICD-10-CM | POA: Diagnosis not present

## 2023-10-02 DIAGNOSIS — M6281 Muscle weakness (generalized): Secondary | ICD-10-CM | POA: Diagnosis not present

## 2023-10-02 DIAGNOSIS — R278 Other lack of coordination: Secondary | ICD-10-CM

## 2023-10-02 NOTE — Patient Instructions (Addendum)
 Access Code: [email or web address] URL: [email or web address] Date: 10/02/2023 Prepared by: Amada Kingfisher  New Exercises  - Wrist AROM Flexion Extension  - 2 x daily - 10 reps - Wrist Radial Deviation AROM  - 2 x daily - 10 reps - Seated Forearm Pronation and Supination AROM  - 2 x daily - 10 reps

## 2023-10-02 NOTE — Therapy (Unsigned)
 OUTPATIENT OCCUPATIONAL THERAPY NEURO TREATMENT  Patient Name: [name] Page MRN: [id number] DOB:1945/08/19, 78 y.o., female Today's Date: 10/02/2023  PCP: Lockie Mola, MD REFERRING PROVIDER: Westley Chandler, MD   END OF SESSION:  OT End of Session - 10/02/23 1315     Visit Number 6    Number of Visits 9   including eval   Date for OT Re-Evaluation 10/18/23    Authorization Type [employer], auth required    Authorization Time Period 1/28-3/25/25 Osteoarthritis of both hands    Authorization - Visit Number 5    Authorization - Number of Visits 9    Progress Note Due on Visit 10    OT Start Time 1315    OT Stop Time 1400    OT Time Calculation (min) 45 min    Activity Tolerance Patient tolerated treatment well    Behavior During Therapy WFL for tasks assessed/performed            Past Medical History:  Diagnosis Date   Allergic rhinitis 09/19/2006   Qualifier: Diagnosis of  By: Luz Brazen MD, Jessica     Allergy    Arthritis    [place] tarry stools    from iron pills   CHF (congestive heart failure) (HCC) 1997   Colon polyps 2005   COPD (chronic obstructive pulmonary disease) (HCC)    GERD (gastroesophageal reflux disease)    Heart attack (HCC) 1997 and 2001   Hyperlipidemia    Hypertension    Otomycosis of both ears 12/28/2019   Pneumonia 2012   Status post dilation of esophageal narrowing    TMJ dysfunction 03/27/2019   Past Surgical History:  Procedure Laterality Date   CESAREAN SECTION  01/1976   COLONOSCOPY     CORONARY ANGIOPLASTY WITH STENT PLACEMENT  2002   POLYPECTOMY     Patient Active Problem List   Diagnosis Date Noted   Osteoarthritis of both hands 04/11/2023   Dyshidrotic eczema 06/08/2022   Ganglion cyst of both wrists 06/08/2022   Nevus 01/19/2022   Vacuolar interface dermatitis 06/08/2021   Rash 02/17/2021   Hyponatremia 11/23/2020   Grief reaction 11/23/2020   Atypical chest pain 09/28/2020   Dry skin dermatitis 09/28/2020    Systolic murmur 06/30/2020   Hair loss 09/19/2017   Osteopenia 11/04/2014   Polyarthralgia 10/17/2012   COPD (chronic obstructive pulmonary disease) (HCC) 10/01/2011   Restrictive lung disease 10/01/2011   GERD (gastroesophageal reflux disease) 11/20/2006   HYPERCHOLESTEROLEMIA 09/19/2006   Smoker 09/19/2006   Essential hypertension 09/19/2006   Coronary atherosclerosis 09/19/2006   CLAUDICATION, INTERMITTENT 09/19/2006    ONSET DATE: 07/23/2023 (referral date)  REFERRING DIAG: M19.041,M19.042 (ICD-10-CM) - Osteoarthritis of both hands, unspecified osteoarthritis type   THERAPY DIAG:  Pain in joint of right hand  Pain in joint of left hand  Muscle weakness (generalized)  Other lack of coordination  Rationale for Evaluation and Treatment: Rehabilitation  SUBJECTIVE:   SUBJECTIVE STATEMENT:   Pt reports she is doing her exercises and they help her hands feel better.  Pt accompanied by: self  PERTINENT HISTORY: hx of tobacco use and subsequent cessation, T2DM, HTN, OA, CAD, COPD   Per 07/23/23 referral notes: "Hand OA and right shoulder OA  Very difficult for her to do everyday tasks, frequently gets ganglion cysts"  Per 07/12/23 MD Progress Notes: "Patient says her hand pain and swelling has gotten much worse. She is not able to open jars or use scissors or a knife. She is unable to  hold a cup. She feels her fingers and wrists are more swollen. She also notes a "knot" on the dorsal surface of her Left hand and on the radial side of her right wrist. She says she has had knots like this before on her hands/wrist and they are very painful and go away on their own. She says celebrex has helped her hand pain and swelling previously. Says voltaren gel is not doing enough at this time. She has tried splints but does not appreciate much relief. Has gone to rheumatology almost 5 years ago but feels her hand pain has gotten much worse now."   PRECAUTIONS: Fall   WEIGHT BEARING  RESTRICTIONS: No  PAIN:  Are you having pain? Yes: NPRS scale: R/L - 4/10 Pain location: B wrists, fingers feel pretty good Pain description: sharp, stabbing Aggravating factors: morning time Relieving factors: afternoon time "usually not as bad"  FALLS: Has patient fallen in last 6 months? No  LIVING ENVIRONMENT: Lives with: lives alone Lives in: House/apartment Stairs: No Has following equipment at home: Dan Humphreys - 2 wheeled  PLOF: Pt reported symptoms worsened about 2 months ago "out of the blue," unsure of any causative factors. Pt reported 2 months ago, pt could complete ADL/IADL tasks, including grooming, dressing, driving, and household management.  PATIENT GOALS: "to get back some control of my hands"  OBJECTIVE:  Note: Objective measures were completed at Evaluation unless otherwise noted.  HAND DOMINANCE: Right  ADLs: Overall ADLs:  Eating: ind in P.M., avoids eating breakfast because increased difficulty with grasping objects in A.M. Grooming: Pt reported difficulty lifting arms overhead and cannot hold onto brush for grooming tasks. UB Dressing: ind with significantly extra time, avoiding buttons/zippers or use of large buttons LB Dressing: ind with slip-on shoes, elastic waist pants, extra time: "takes me 20 minutes to put on a pair of socks" Toileting: ind, some difficulty with wiping, per pt: limiting water intake and avoiding drinking as much water to avoid toileting tasks as frequently secondary to pain. Bathing: ind with loofah sponge, difficulty holding shower brush, pain with loofah sponge wrapped around pain Tub Shower transfers: ind Equipment:  tub/shower  IADLs: Shopping: daughter assists Light housekeeping: difficulty "I'm doing the best I can." Meal Prep: ind for simple meals, unable to cut up items d/t difficulty grasping knife and pt concerns about safety Community mobility: daughter assists  Handwriting:  Pt reported "handwriting is not as good."  Pt reported not frequently handwriting.  MOBILITY STATUS: Independent  POSTURE COMMENTS:  rounded shoulders and forward head Sitting balance:  ind  ACTIVITY TOLERANCE: Activity tolerance: Pt reported poor sleep quality d/t pain which limits energy.  FUNCTIONAL OUTCOME MEASURES: Quick Dash: 70.5% deficit    UPPER EXTREMITY ROM:    Active ROM Right eval Left eval  Shoulder flexion WFL (painful) Severe pain at approx. 60*, ultimately achieved Approx. 110* with report "like a knife sticking" in shoulder  Shoulder abduction WFL (painful, "it feels like it wants to pop")   Shoulder adduction    Shoulder extension    Shoulder internal rotation    Shoulder external rotation    Elbow flexion [facility] WFL  Elbow extension [facility] WFL though extremely hesitant with movement, limited by pain  Wrist flexion 30* 20*  Wrist extension 58* 5*  Wrist ulnar deviation    Wrist radial deviation    Wrist pronation [facility] WFL  Wrist supination WFL WFL  (Blank rows = not tested)  Pt demo'd slower, hesitant movements with BUE though  especially with LUE.  AROM RUE opposition between thumb and pinky - 0 cm AROM LUE opposition between thumb and pinky - 0.5 cm AROM Composite fist LUE - approx. 3.5 cm between digits and palm, impaired AROM Composite fist RUE - approx. 2 cm between digits and palm, impaired  PROM composite fist BUE - impaired, similar flex of digits as compared to AROM  HAND FUNCTION: Tip pinch: Right 2 lbs, Left: 2 lbs  COORDINATION: 9-hold peg test: R: 36 seconds. L: 42 seconds.  Ataxic movements, large "shaking" movements of BUE.  SENSATION: Pt reported tingling/numbness at tips of digits 2-4 on R and digits 2-5 on L side.  EDEMA: Noted at joints   MUSCLE TONE: RUE: Rigidity and LUE: Rigidity  COGNITION: Overall cognitive status: Within functional limits for tasks assessed  PRAXIS: Not tested  OBSERVATIONS: Pt ambulated ind without A/E though with unsteady gait. Pt  severely limited by pain with BUE movements, more impairment and limitations on L side compared to R side. Pt demo'd skin sloughing off on ulnar side of R palm, and pt reported hx of eczema and "popping blisters" (fluid-filled) on R palm yesterday. Pt demo'd moderate swelling at several joints of digits of BUE.                                                                                                                           TREATMENT:    Therapeutic Activities: OT educated pt on joint protection principles as noted in pt instructions as needed to improve UE pain.   Handout is provided regarding joint protection and patient is encouraged to consider the specific acronym "LESS" ie) less strain on joints  L: Listen to your body E: Energy Conservation S: Stronger Joints take the Lead S: Strategize   Patient encouraged to protect hands and wrist by  - Respecting for Pain and stopping activities before they reach the point of discomfort or pain  - Rest and Work Balance ie) balancing activities with appropriate rests during activity - Reduction of Effort - Use two hands instead of one if possible  - Use of Larger/Stronger Joints Ie) Lift or carry with the forearm or shoulder rather than fingers - Avoid Activities That Cannot Be Stopped - Use of Assistive Equipment - Consider splint use and AE equipment to protect joints from deformity and stresses Then reviewed activities that can be modified with adaptive equipment and encourage patient to look adaptive equipment for arthritis [i.e. to consider joint protection].  - Therapeutic exercises completed for duration as noted below including: Reviewed tendon gliding exercises/handout with review of motions to isolate DIP, PIP and MCP joints for straight finger position, hook (DIP/PIP flexion), fist (DIP/PIP/MCP flexion), taco/duck (MCP flexion only) and flat fist (MCP and PIP flexion).  Pt has improved ability to achieved modified hook position at  this time which was very difficult/impossible initially.   Added New Wrist ROM Exercises per pt instructions including:   - Wrist AROM Flexion  Extension  - 2 x daily - 10 reps - Wrist Radial Deviation AROM  - 2 x daily - 10 reps - Seated Forearm Pronation and Supination AROM  - 2 x daily - 10 reps    PATIENT EDUCATION: Education details: Wrist ROM, jt protection hand out Person educated: Patient Education method: Explanation, Demonstration, Tactile cues, Verbal cues, and Handouts Education comprehension: verbalized understanding, returned demonstration, verbal cues required, tactile cues required, and needs further education  HOME EXERCISE PROGRAM: 08/28/23: Tendon Gliding Exercises, Sleep positions and edema control 09/04/23: Shoulder ROM - table slides Access Code: [email or web address] 09/18/23: Coordination Activities 10/02/23: Wrist/forearm ROM - same access code, jt protection   GOALS: Goals reviewed with patient? Yes  SHORT TERM GOALS: Target date: 09/20/23  Patient will demonstrate updated BUE HEP with no more than min v/c and visual handouts. Baseline: new to outpt OT Goal status: MET  2.  Pt will ind recall wear and care schedule for splinting/gloves options. Baseline: No current splint/gloves. Pt recently ordered arthritis gloves which will arrive soon. Goal status: IN Progress  3.  Pt will recall at least 2 pain management strategies to decrease BUE pain. Baseline: new to outpt OT,   6/10 pain at rest which increases with BUE movement (worse on L side compared to R side) Goal status: IN Progress Tylenol arthritis  4.  Pt will verbalize understanding of sleep positioning strategies to protect BUE joints, decrease pain, and improve sleep quality. Baseline: new to outpt OT. Pt reported difficulty with sleep secondary to pain. Goal status: IN Progress  5.  Pt will recall at least 3 joint protection strategies. Baseline: new to outpt OT Goal status: IN Progress  6.  Pt will return  demonstration of desensitization strategies to reduce pain/discomfort of BUE. Baseline: new to outpt OT Goal status: IN Progress  LONG TERM GOALS: Target date: 10/18/23  Using adaptive strategies and A/E PRN, pt will demo improved functional grasp and pinch with BUE to improve participation in ADL tasks. Baseline: Pt reported difficulty with grasping objects for ADL tasks, including but not limited to eating utensils, cup, brush, long-handled sponge, and pen. Goal status: IN Progress  3.  Pt will verbalize understanding of adaptive strategies and A/E to improve ind and decrease BUE pain for ADL bathing and toileting tasks. Baseline: Pt reported difficulty with bathing and toileting tasks secondary to BUE pain Goal status: IN Progress  4.  Patient will demonstrate at least 16% improvement with quick Dash score (reporting 54.5% disability or less) indicating improved functional use of affected extremity.  Baseline: Quick Dash: 70.5% deficit Goal status: IN Progress  5.  Patient will demo improved FM coordination as evidenced by completing nine-hole peg in 31 seconds or less with RUE and 37 seconds or less with LUE.  Baseline: 9-hold peg test: R: 36 seconds. L: 42 seconds. Goal status: IN Progress  ASSESSMENT:  CLINICAL IMPRESSION: Patient verbalizes good understanding of HEP ideas for gentle ROM along with jt protection.  She has a prominent R CMC jt and eduction provided on thumb spica splint options to help support jt.  Ongoing education re: recommendations as needed to improve pain, joint integrity, and overall functional use. Pt would benefit from continued skilled OT services in the outpatient setting to provided modalities and self-management ideas for jt pain and functional use of hands.  PERFORMANCE DEFICITS: in functional skills including ADLs, IADLs, coordination, dexterity, proprioception, sensation, edema, ROM, strength, pain, flexibility, Fine motor control, Gross motor control,  mobility, balance,  body mechanics, endurance, decreased knowledge of use of DME, and UE functional use, cognitive skills including  none , and psychosocial skills including environmental adaptation.   IMPAIRMENTS: are limiting patient from ADLs, IADLs, rest and sleep, leisure, and social participation.   CO-MORBIDITIES: may have co-morbidities  that affects occupational performance. Patient will benefit from skilled OT to address above impairments and improve overall function.  REHAB POTENTIAL: Good  PLAN:  OT FREQUENCY: 1x/week - OT recommended 2x per week though pt reported difficulty with transportation and therefore requested 1x per week.  OT DURATION: 8 weeks  PLANNED INTERVENTIONS: 97168 OT Re-evaluation, 97535 self care/ADL training, 13086 therapeutic exercise, 97530 therapeutic activity, 97112 neuromuscular re-education, 97140 manual therapy, 97035 ultrasound, 97018 paraffin, 57846 fluidotherapy, 97010 moist heat, 97010 cryotherapy, 97760 Orthotics management and training, 96295 Splinting (initial encounter), [id number] Subsequent splinting/medication, passive range of motion, functional mobility training, energy conservation, patient/family education, and DME and/or AE instructions  RECOMMENDED OTHER SERVICES: n/a  CONSULTED AND AGREED WITH PLAN OF CARE: Patient  PLAN FOR NEXT SESSION: [place]? Taping?   HEP - continue tendon glides - monitor pain threshold, ?shoulder AAROM - monitor pain threshold, FM coordination  A/E and adaptive strategies for bathing, toileting, grasping objects (built-up handles and universal cuff  for eating utensils , pen, hair brush, long-handled sponge; bath mit for bathing tasks)  Review Joint protection handout as needed  Pain management handout - modalities - continue Fluido/consider [place] for B edema/pain management and or taping.   Desensitization handout   Victorino Sparrow, OT 10/02/2023, 5:12 PM

## 2023-10-09 ENCOUNTER — Ambulatory Visit: Payer: Medicare Other | Admitting: Occupational Therapy

## 2023-10-09 ENCOUNTER — Ambulatory Visit: Payer: Medicare Other | Attending: Cardiology | Admitting: Cardiology

## 2023-10-09 ENCOUNTER — Encounter: Payer: Self-pay | Admitting: Cardiology

## 2023-10-09 VITALS — BP 132/60 | HR 64 | Ht 62.0 in | Wt 140.6 lb

## 2023-10-09 DIAGNOSIS — I2510 Atherosclerotic heart disease of native coronary artery without angina pectoris: Secondary | ICD-10-CM | POA: Diagnosis not present

## 2023-10-09 DIAGNOSIS — I739 Peripheral vascular disease, unspecified: Secondary | ICD-10-CM | POA: Diagnosis not present

## 2023-10-09 DIAGNOSIS — I10 Essential (primary) hypertension: Secondary | ICD-10-CM | POA: Diagnosis not present

## 2023-10-09 DIAGNOSIS — I471 Supraventricular tachycardia, unspecified: Secondary | ICD-10-CM

## 2023-10-09 DIAGNOSIS — M25542 Pain in joints of left hand: Secondary | ICD-10-CM | POA: Diagnosis not present

## 2023-10-09 DIAGNOSIS — R29898 Other symptoms and signs involving the musculoskeletal system: Secondary | ICD-10-CM | POA: Diagnosis not present

## 2023-10-09 DIAGNOSIS — R208 Other disturbances of skin sensation: Secondary | ICD-10-CM | POA: Diagnosis not present

## 2023-10-09 DIAGNOSIS — M6281 Muscle weakness (generalized): Secondary | ICD-10-CM

## 2023-10-09 DIAGNOSIS — R278 Other lack of coordination: Secondary | ICD-10-CM

## 2023-10-09 DIAGNOSIS — M25541 Pain in joints of right hand: Secondary | ICD-10-CM | POA: Diagnosis not present

## 2023-10-09 NOTE — Therapy (Unsigned)
 OUTPATIENT OCCUPATIONAL THERAPY NEURO TREATMENT  Patient Name: [name] Page MRN: [id number] DOB:1946/03/07, 78 y.o., female Today's Date: 10/09/2023  PCP: Lockie Mola, MD REFERRING PROVIDER: Westley Chandler, MD   END OF SESSION:  OT End of Session - 10/09/23 1316     Visit Number 7    Number of Visits 9   including eval   Date for OT Re-Evaluation 10/18/23    Authorization Type [employer] Medicare, auth required    Authorization Time Period 1/28-3/25/25 Osteoarthritis of both hands    Authorization - Number of Visits 9    Progress Note Due on Visit 10    OT Start Time 1316    OT Stop Time 1400    OT Time Calculation (min) 44 min    Equipment Utilized During Treatment Fluidotherapy    Activity Tolerance Patient tolerated treatment well    Behavior During Therapy WFL for tasks assessed/performed            Past Medical History:  Diagnosis Date   Allergic rhinitis 09/19/2006   Qualifier: Diagnosis of  By: Luz Brazen MD, Jessica     Allergy    Arthritis    [place] tarry stools    from iron pills   CHF (congestive heart failure) (HCC) 1997   Colon polyps 2005   COPD (chronic obstructive pulmonary disease) (HCC)    GERD (gastroesophageal reflux disease)    Heart attack (HCC) 1997 and 2001   Hyperlipidemia    Hypertension    Otomycosis of both ears 12/28/2019   Pneumonia 2012   Status post dilation of esophageal narrowing    TMJ dysfunction 03/27/2019   Past Surgical History:  Procedure Laterality Date   CESAREAN SECTION  01/1976   COLONOSCOPY     CORONARY ANGIOPLASTY WITH STENT PLACEMENT  2002   POLYPECTOMY     Patient Active Problem List   Diagnosis Date Noted   Osteoarthritis of both hands 04/11/2023   Dyshidrotic eczema 06/08/2022   Ganglion cyst of both wrists 06/08/2022   Nevus 01/19/2022   Vacuolar interface dermatitis 06/08/2021   Rash 02/17/2021   Hyponatremia 11/23/2020   Grief reaction 11/23/2020   Atypical chest pain 09/28/2020   Dry skin dermatitis  09/28/2020   Systolic murmur 06/30/2020   Hair loss 09/19/2017   Osteopenia 11/04/2014   Polyarthralgia 10/17/2012   COPD (chronic obstructive pulmonary disease) (HCC) 10/01/2011   Restrictive lung disease 10/01/2011   GERD (gastroesophageal reflux disease) 11/20/2006   HYPERCHOLESTEROLEMIA 09/19/2006   Smoker 09/19/2006   Essential hypertension 09/19/2006   Coronary atherosclerosis 09/19/2006   CLAUDICATION, INTERMITTENT 09/19/2006    ONSET DATE: 07/23/2023 (referral date)  REFERRING DIAG: M19.041,M19.042 (ICD-10-CM) - Osteoarthritis of both hands, unspecified osteoarthritis type   THERAPY DIAG:  Pain in joint of right hand  Pain in joint of left hand  Muscle weakness (generalized)  Other lack of coordination  Other symptoms and signs involving the musculoskeletal system  Rationale for Evaluation and Treatment: Rehabilitation  SUBJECTIVE:   SUBJECTIVE STATEMENT:   Pt reports she is doing her exercises and they help her hands feel better.  Pt accompanied by: self  PERTINENT HISTORY: hx of tobacco use and subsequent cessation, T2DM, HTN, OA, CAD, COPD   Per 07/23/23 referral notes: "Hand OA and right shoulder OA  Very difficult for her to do everyday tasks, frequently gets ganglion cysts"  Per 07/12/23 MD Progress Notes: "Patient says her hand pain and swelling has gotten much worse. She is not able to open jars or  use scissors or a knife. She is unable to hold a cup. She feels her fingers and wrists are more swollen. She also notes a "knot" on the dorsal surface of her Left hand and on the radial side of her right wrist. She says she has had knots like this before on her hands/wrist and they are very painful and go away on their own. She says celebrex has helped her hand pain and swelling previously. Says voltaren gel is not doing enough at this time. She has tried splints but does not appreciate much relief. Has gone to rheumatology almost 5 years ago but feels her hand  pain has gotten much worse now."   PRECAUTIONS: Fall   WEIGHT BEARING RESTRICTIONS: No  PAIN:  Are you having pain? Yes: NPRS scale: R/L - 4/10 Pain location: B wrists, fingers feel pretty good Pain description: sharp, stabbing Aggravating factors: morning time Relieving factors: afternoon time "usually not as bad"  FALLS: Has patient fallen in last 6 months? No  LIVING ENVIRONMENT: Lives with: lives alone Lives in: House/apartment Stairs: No Has following equipment at home: Dan Humphreys - 2 wheeled  PLOF: Pt reported symptoms worsened about 2 months ago "out of the blue," unsure of any causative factors. Pt reported 2 months ago, pt could complete ADL/IADL tasks, including grooming, dressing, driving, and household management.  PATIENT GOALS: "to get back some control of my hands"  OBJECTIVE:  Note: Objective measures were completed at Evaluation unless otherwise noted.  HAND DOMINANCE: Right  ADLs: Overall ADLs:  Eating: ind in P.M., avoids eating breakfast because increased difficulty with grasping objects in A.M. Grooming: Pt reported difficulty lifting arms overhead and cannot hold onto brush for grooming tasks. UB Dressing: ind with significantly extra time, avoiding buttons/zippers or use of large buttons LB Dressing: ind with slip-on shoes, elastic waist pants, extra time: "takes me 20 minutes to put on a pair of socks" Toileting: ind, some difficulty with wiping, per pt: limiting water intake and avoiding drinking as much water to avoid toileting tasks as frequently secondary to pain. Bathing: ind with loofah sponge, difficulty holding shower brush, pain with loofah sponge wrapped around pain Tub Shower transfers: ind Equipment:  tub/shower  IADLs: Shopping: daughter assists Light housekeeping: difficulty "I'm doing the best I can." Meal Prep: ind for simple meals, unable to cut up items d/t difficulty grasping knife and pt concerns about safety Community mobility:  daughter assists  Handwriting:  Pt reported "handwriting is not as good." Pt reported not frequently handwriting.  MOBILITY STATUS: Independent  POSTURE COMMENTS:  rounded shoulders and forward head Sitting balance:  ind  ACTIVITY TOLERANCE: Activity tolerance: Pt reported poor sleep quality d/t pain which limits energy.  FUNCTIONAL OUTCOME MEASURES: Quick Dash: 70.5% deficit    UPPER EXTREMITY ROM:    Active ROM Right eval Left eval  Shoulder flexion WFL (painful) Severe pain at approx. 60*, ultimately achieved Approx. 110* with report "like a knife sticking" in shoulder  Shoulder abduction WFL (painful, "it feels like it wants to pop")   Shoulder adduction    Shoulder extension    Shoulder internal rotation    Shoulder external rotation    Elbow flexion [facility] WFL  Elbow extension [facility] WFL though extremely hesitant with movement, limited by pain  Wrist flexion 30* 20*  Wrist extension 58* 5*  Wrist ulnar deviation    Wrist radial deviation    Wrist pronation [facility] WFL  Wrist supination WFL WFL  (Blank rows = not tested)  Pt demo'd slower, hesitant movements with BUE though especially with LUE.  AROM RUE opposition between thumb and pinky - 0 cm AROM LUE opposition between thumb and pinky - 0.5 cm AROM Composite fist LUE - approx. 3.5 cm between digits and palm, impaired AROM Composite fist RUE - approx. 2 cm between digits and palm, impaired  PROM composite fist BUE - impaired, similar flex of digits as compared to AROM  HAND FUNCTION: Tip pinch: Right 2 lbs, Left: 2 lbs  COORDINATION: 9-hold peg test: R: 36 seconds. L: 42 seconds.  Ataxic movements, large "shaking" movements of BUE.  SENSATION: Pt reported tingling/numbness at tips of digits 2-4 on R and digits 2-5 on L side.  EDEMA: Noted at joints   MUSCLE TONE: RUE: Rigidity and LUE: Rigidity  COGNITION: Overall cognitive status: Within functional limits for tasks assessed  PRAXIS: Not  tested  OBSERVATIONS: Pt ambulated ind without A/E though with unsteady gait. Pt severely limited by pain with BUE movements, more impairment and limitations on L side compared to R side. Pt demo'd skin sloughing off on ulnar side of R palm, and pt reported hx of eczema and "popping blisters" (fluid-filled) on R palm yesterday. Pt demo'd moderate swelling at several joints of digits of BUE.                                                                                                                           TREATMENT:    Therapeutic Activities: OT educated pt on joint protection principles as noted in pt instructions as needed to improve UE pain.   Handout is provided regarding joint protection and patient is encouraged to consider the specific acronym "LESS" ie) less strain on joints  L: Listen to your body E: Energy Conservation S: Stronger Joints take the Lead S: Strategize   Patient encouraged to protect hands and wrist by  - Respecting for Pain and stopping activities before they reach the point of discomfort or pain  - Rest and Work Balance ie) balancing activities with appropriate rests during activity - Reduction of Effort - Use two hands instead of one if possible  - Use of Larger/Stronger Joints Ie) Lift or carry with the forearm or shoulder rather than fingers - Avoid Activities That Cannot Be Stopped - Use of Assistive Equipment - Consider splint use and AE equipment to protect joints from deformity and stresses Then reviewed activities that can be modified with adaptive equipment and encourage patient to look adaptive equipment for arthritis [i.e. to consider joint protection].  - Therapeutic exercises completed for duration as noted below including: Reviewed tendon gliding exercises/handout with review of motions to isolate DIP, PIP and MCP joints for straight finger position, hook (DIP/PIP flexion), fist (DIP/PIP/MCP flexion), taco/duck (MCP flexion only) and flat fist (MCP  and PIP flexion).  Pt has improved ability to achieved modified hook position at this time which was very difficult/impossible initially.   Added New Wrist ROM Exercises per pt  instructions including:   - Wrist AROM Flexion Extension  - 2 x daily - 10 reps - Wrist Radial Deviation AROM  - 2 x daily - 10 reps - Seated Forearm Pronation and Supination AROM  - 2 x daily - 10 reps    PATIENT EDUCATION: Education details: Wrist ROM, jt protection hand out Person educated: Patient Education method: Explanation, Demonstration, Tactile cues, Verbal cues, and Handouts Education comprehension: verbalized understanding, returned demonstration, verbal cues required, tactile cues required, and needs further education  HOME EXERCISE PROGRAM: 08/28/23: Tendon Gliding Exercises, Sleep positions and edema control 09/04/23: Shoulder ROM - table slides Access Code: [email or web address] 09/18/23: Coordination Activities 10/02/23: Wrist/forearm ROM - same access code, jt protection   GOALS: Goals reviewed with patient? Yes  SHORT TERM GOALS: Target date: 09/20/23  Patient will demonstrate updated BUE HEP with no more than min v/c and visual handouts. Baseline: new to outpt OT Goal status: MET  2.  Pt will ind recall wear and care schedule for splinting/gloves options. Baseline: No current splint/gloves. Pt recently ordered arthritis gloves which will arrive soon. Goal status: IN Progress  3.  Pt will recall at least 2 pain management strategies to decrease BUE pain. Baseline: new to outpt OT,   6/10 pain at rest which increases with BUE movement (worse on L side compared to R side) Goal status: IN Progress Tylenol arthritis, ROM, splints, using 2 hands and modify grasp  4.  Pt will verbalize understanding of sleep positioning strategies to protect BUE joints, decrease pain, and improve sleep quality. Baseline: new to outpt OT. Pt reported difficulty with sleep secondary to pain. Goal status: MET  5.  Pt will  recall at least 3 joint protection strategies. Baseline: new to outpt OT Goal status: IN Progress 2 hands, splints and   6.  Pt will return demonstration of desensitization strategies to reduce pain/discomfort of BUE. Baseline: new to outpt OT Goal status: IN Progress  LONG TERM GOALS: Target date: 10/18/23  Using adaptive strategies and A/E PRN, pt will demo improved functional grasp and pinch with BUE to improve participation in ADL tasks. Baseline: Pt reported difficulty with grasping objects for ADL tasks, including but not limited to eating utensils, cup, brush, long-handled sponge, and pen. Goal status: IN Progress  3.  Pt will verbalize understanding of adaptive strategies and A/E to improve ind and decrease BUE pain for ADL bathing and toileting tasks. Baseline: Pt reported difficulty with bathing and toileting tasks secondary to BUE pain Goal status: IN Progress  4.  Patient will demonstrate at least 16% improvement with quick Dash score (reporting 54.5% disability or less) indicating improved functional use of affected extremity.  Baseline: Quick Dash: 70.5% deficit Goal status: IN Progress  5.  Patient will demo improved FM coordination as evidenced by completing nine-hole peg in 31 seconds or less with RUE and 37 seconds or less with LUE.  Baseline: 9-hold peg test: R: 36 seconds. L: 42 seconds. Goal status: IN Progress  ASSESSMENT:  CLINICAL IMPRESSION: Patient verbalizes good understanding of HEP ideas for gentle ROM along with jt protection.  She has a prominent R CMC jt and eduction provided on thumb spica splint options to help support jt.  Ongoing education re: recommendations as needed to improve pain, joint integrity, and overall functional use. Pt would benefit from continued skilled OT services in the outpatient setting to provided modalities and self-management ideas for jt pain and functional use of hands.  PERFORMANCE DEFICITS: in functional skills including  ADLs, IADLs, coordination, dexterity, proprioception, sensation, edema, ROM, strength, pain, flexibility, Fine motor control, Gross motor control, mobility, balance, body mechanics, endurance, decreased knowledge of use of DME, and UE functional use, cognitive skills including  none , and psychosocial skills including environmental adaptation.   IMPAIRMENTS: are limiting patient from ADLs, IADLs, rest and sleep, leisure, and social participation.   CO-MORBIDITIES: may have co-morbidities  that affects occupational performance. Patient will benefit from skilled OT to address above impairments and improve overall function.  REHAB POTENTIAL: Good  PLAN:  OT FREQUENCY: 1x/week - OT recommended 2x per week though pt reported difficulty with transportation and therefore requested 1x per week.  OT DURATION: 8 weeks  PLANNED INTERVENTIONS: 97168 OT Re-evaluation, 97535 self care/ADL training, 16109 therapeutic exercise, 97530 therapeutic activity, 97112 neuromuscular re-education, 97140 manual therapy, 97035 ultrasound, 97018 paraffin, 60454 fluidotherapy, 97010 moist heat, 97010 cryotherapy, 97760 Orthotics management and training, 09811 Splinting (initial encounter), [id number] Subsequent splinting/medication, passive range of motion, functional mobility training, energy conservation, patient/family education, and DME and/or AE instructions  RECOMMENDED OTHER SERVICES: n/a  CONSULTED AND AGREED WITH PLAN OF CARE: Patient  PLAN FOR NEXT SESSION: [place]? Taping?  Discharge visit  HEP - continue tendon glides - monitor pain threshold, ?shoulder AAROM - monitor pain threshold, FM coordination  A/E and adaptive strategies for bathing, toileting, grasping objects (built-up handles and universal cuff  for eating utensils , pen, hair brush, long-handled sponge; bath mit for bathing tasks)  Review Joint protection handout as needed  Pain management handout - modalities - continue Fluido/consider [place] for B  edema/pain management and or taping.   Desensitization handout   Victorino Sparrow, OT 10/09/2023, 5:04 PM

## 2023-10-09 NOTE — Progress Notes (Signed)
 Cardiology Office Note:  .   Date:  10/09/2023  ID:  [name] Page, DOB August 02, 1945, MRN [id number] PCP: Lockie Mola, MD  [facility] Providers Cardiologist:  Donato Schultz, MD     History of Present Illness: .   [name] Page is a 78 y.o. female Discussed the use of AI scribe software for clinical note transcription with the patient, who gave verbal consent to proceed.  History of Present Illness [name] Page is a 78 year old female with coronary artery disease and prior myocardial infarction who presents for follow-up.  She is here for follow-up regarding her coronary artery disease and history of myocardial infarction in 1997, for which she underwent percutaneous coronary intervention twice. Her last echocardiogram in December 2021 showed a normal ejection fraction, moderate left ventricular hypertrophy, moderate mitral annular calcification, and aortic sclerosis without stenosis. Right atrial pressures were normal. A Zio monitor indicated normal rhythm with two runs of non-sustained ventricular tachycardia lasting twelve beats and seven runs of supraventricular tachycardia, the longest lasting eighteen beats. No chest pain, shortness of breath, or any unusual symptoms are reported.  She is compliant with her medication regimen, which includes bisoprolol 10 mg daily, amlodipine 10 mg daily, aspirin 81 mg daily, cilostazol 100 mg twice daily, hydrochlorothiazide 12.5 mg daily, losartan 100 mg daily, and pravastatin 40 mg daily. She emphasizes the importance of taking her medications as prescribed and notes that she avoids foods that could negatively impact her health.  She mentions experiencing significant joint achiness due to arthritis, which she describes as 'getting terrible.' She is currently undergoing therapy for this condition. Prolonged walking or standing exacerbates her lower back pain, but it improves with rest.  Her social history includes a focus on  maintaining a healthy lifestyle, avoiding foods like 'country ham,' and adhering to her medication regimen. She shares that her sister, who is not as compliant with her medications, is experiencing more health issues.     Studies Reviewed: .        Results LABS LDL: 70 Triglycerides: 170 Hb: 12.4 Cr: 0.7 ALT: 12  DIAGNOSTIC Echocardiogram (06/2020): Normal ejection fraction, moderate LVH, moderate mitral annular calcification, aortic sclerosis with no stenosis, right atrial pressures normal Zio monitor: Normal rhythm, two runs of NSVT lasting twelve beats, seven runs of SVT, longest lasting eighteen beats Echocardiogram (2004): EF 65% Risk Assessment/Calculations:            Physical Exam:   VS:  BP 132/60   Pulse 64   Ht 5\' 2"  (1.575 m)   Wt 140 lb 9.6 oz (63.8 kg)   SpO2 92%   BMI 25.72 kg/m    Wt Readings from Last 3 Encounters:  10/09/23 140 lb 9.6 oz (63.8 kg)  07/12/23 141 lb (64 kg)  04/11/23 143 lb (64.9 kg)    GEN: Well nourished, well developed in no acute distress NECK: No JVD; No carotid bruits CARDIAC: RRR, no murmurs, no rubs, no gallops RESPIRATORY:  Clear to auscultation without rales, wheezing or rhonchi  ABDOMEN: Soft, non-tender, non-distended EXTREMITIES:  No edema; No deformity   ASSESSMENT AND PLAN: .    Assessment and Plan Assessment & Plan Coronary Artery Disease (CAD) with Myocardial Infarction Myocardial infarction in 1997 with subsequent PCI times two. Echocardiogram in December 2021 showed normal ejection fraction, moderate LVH, moderate mitral annular calcification, and aortic sclerosis without stenosis. Zio monitor showed normal rhythm with two runs of NSVT and seven runs of SVT. Currently asymptomatic with  no chest pain or dyspnea. Medications include bisoprolol, amlodipine, aspirin, cilostazol, hydrochlorothiazide, losartan, and pravastatin. LDL is 70, triglycerides 170, hemoglobin 12.4, creatinine 0.7, and ALT 12, indicating good  control of risk factors. Emphasizes adherence to medication regimen and lifestyle modifications. - Continue bisoprolol, amlodipine, aspirin, cilostazol, hydrochlorothiazide, losartan, pravastatin - Annual follow-up with cardiology team  Peripheral Artery Disease (PAD) PAD managed with cilostazol. No claudication or leg pain during ambulation, but experiences lower back pain when standing or walking for extended periods. Continues cilostazol as prescribed. - Continue cilostazol 100 mg twice daily  Hypertension Hypertension managed with bisoprolol, amlodipine, hydrochlorothiazide, and losartan. Blood pressure is well-controlled as part of overall cardiovascular management. - Continue current antihypertensive regimen  Hyperlipidemia Hyperlipidemia managed with pravastatin. LDL is 70 and triglycerides are 170, indicating good control of lipid levels. - Continue pravastatin 40 mg daily  Goals of Care Desires to maintain current health status and continue with current management plan. Wishes to live as long as possible and emphasizes adherence to medication regimen and lifestyle modifications. - Encourage continued adherence to medication and lifestyle modifications  Follow-up Well-controlled cardiovascular status with good control of risk factors. - Schedule follow-up with cardiology team APP in one year           Signed, Donato Schultz, MD

## 2023-10-09 NOTE — Patient Instructions (Signed)
 Medication Instructions:  Your physician recommends that you continue on your current medications as directed. Please refer to the Current Medication list given to you today. n *If you need a refill on your cardiac medications before your next appointment, please call your pharmacy*   Lab Work: None ordered  If you have labs (blood work) drawn today and your tests are completely normal, you will receive your results only by: MyChart Message (if you have MyChart) OR A paper copy in the mail If you have any lab test that is abnormal or we need to change your treatment, we will call you to review the results.   Testing/Procedures: None ordere   Follow-Up: At [facility], you and your health needs are our priority.  As part of our continuing mission to provide you with exceptional heart care, we have created designated Provider Care Teams.  These Care Teams include your primary Cardiologist (physician) and Advanced Practice Providers (APPs -  Physician Assistants and Nurse Practitioners) who all work together to provide you with the care you need, when you need it.  We recommend signing up for the patient portal called "MyChart".  Sign up information is provided on this After Visit Summary.  MyChart is used to connect with patients for Virtual Visits (Telemedicine).  Patients are able to view lab/test results, encounter notes, upcoming appointments, etc.  Non-urgent messages can be sent to your provider as well.   To learn more about what you can do with MyChart, go to [email or web address].    Your next appointment:   12 month(s)  Provider:   Ronie Spies, PA-C or Tereso Newcomer, PA-C        2 Years with Dr. Anne Fu Other Instructions    1st Floor: - Lobby - Registration  - Pharmacy  - Lab - Cafe  2nd Floor: - PV Lab - Diagnostic Testing (echo, CT, nuclear med)  3rd Floor: - Vacant  4th Floor: - TCTS (cardiothoracic surgery) - AFib Clinic - Structural Heart  Clinic - Vascular Surgery  - Vascular Ultrasound  5th Floor: - HeartCare Cardiology (general and EP) - Clinical Pharmacy for coumadin, hypertension, lipid, weight-loss medications, and med management appointments    Valet parking services will be available as well.

## 2023-10-11 ENCOUNTER — Ambulatory Visit: Payer: Medicare Other | Admitting: Family Medicine

## 2023-10-14 ENCOUNTER — Ambulatory Visit (INDEPENDENT_AMBULATORY_CARE_PROVIDER_SITE_OTHER): Admitting: Family Medicine

## 2023-10-14 ENCOUNTER — Encounter: Payer: Self-pay | Admitting: Family Medicine

## 2023-10-14 VITALS — BP 118/60 | HR 67 | Ht 62.0 in | Wt 140.1 lb

## 2023-10-14 DIAGNOSIS — M19041 Primary osteoarthritis, right hand: Secondary | ICD-10-CM

## 2023-10-14 DIAGNOSIS — M19042 Primary osteoarthritis, left hand: Secondary | ICD-10-CM

## 2023-10-14 NOTE — Patient Instructions (Addendum)
 It was wonderful to see you today! Thank you for choosing [facility].   Please bring ALL of your medications with you to every visit.   Today we talked about:  Keep going with home hand exercise and finish out therapy. You can try to use Arnica topical therapy or CBD topical lotion on your hands to help with pain relief. You can continue to take up to Tylenol 1000mg  four times per day. You can try an antiinflammatory diet including turmeric.   Please follow up as needed or persistent symptoms  Call the clinic at [phone] if your symptoms worsen or you have any concerns.  Please be sure to schedule follow up at the front desk before you leave today.   Elberta Fortis, DO Family Medicine

## 2023-10-14 NOTE — Progress Notes (Signed)
    SUBJECTIVE:   CHIEF COMPLAINT / HPI:   Hand arthritis Seen 07/12/2023 for similar symptoms, RA workup negative.  Hand XR showed severe OA, most likely underlying cause.  Considered referral to hand surgery, patient not interested.  Has been going to OT, feels it has significantly improved her symptoms.  Reports her last day is this week given insurance coverage.  Unable to tolerate celecoxib due to GI upset.  PERTINENT  PMH / PSH: HTN, CAD, COPD, GERD, HLD, OA  OBJECTIVE:   BP 118/60   Pulse 67   Ht 5\' 2"  (1.575 m)   Wt 140 lb 2 oz (63.6 kg)   SpO2 98%   BMI 25.63 kg/m    General: NAD, pleasant, able to participate in exam MSK: Significant joint enlargement diffusely on both of both PIP and DIP joints.  No surrounding erythema noted.  Normal grip strength.  2+ radial pulses.  Good distal cap refill.  ASSESSMENT/PLAN:   Assessment & Plan Osteoarthritis of both hands, unspecified osteoarthritis type XR significant for severe OA, RA and inflammatory workup otherwise negative.  Improved with OT, will continue with supportive cares patient would like to avoid referral to hand surgeon doubt they will be able to add much to care. -Continue OT exercises, Voltaren gel and can utilize other OTC options such as Arnica and anti-inflammatory diet   Dr. Elberta Fortis, DO [facility]

## 2023-10-14 NOTE — Assessment & Plan Note (Signed)
 XR significant for severe OA, RA and inflammatory workup otherwise negative.  Improved with OT, will continue with supportive cares patient would like to avoid referral to hand surgeon doubt they will be able to add much to care. -Continue OT exercises, Voltaren gel and can utilize other OTC options such as Arnica and anti-inflammatory diet

## 2023-10-15 ENCOUNTER — Ambulatory Visit: Payer: Self-pay | Admitting: Occupational Therapy

## 2023-10-15 DIAGNOSIS — M6281 Muscle weakness (generalized): Secondary | ICD-10-CM | POA: Diagnosis not present

## 2023-10-15 DIAGNOSIS — M25542 Pain in joints of left hand: Secondary | ICD-10-CM | POA: Diagnosis not present

## 2023-10-15 DIAGNOSIS — R278 Other lack of coordination: Secondary | ICD-10-CM | POA: Diagnosis not present

## 2023-10-15 DIAGNOSIS — M25541 Pain in joints of right hand: Secondary | ICD-10-CM | POA: Diagnosis not present

## 2023-10-15 DIAGNOSIS — R29898 Other symptoms and signs involving the musculoskeletal system: Secondary | ICD-10-CM | POA: Diagnosis not present

## 2023-10-15 DIAGNOSIS — R208 Other disturbances of skin sensation: Secondary | ICD-10-CM

## 2023-10-15 NOTE — Therapy (Addendum)
 OUTPATIENT OCCUPATIONAL THERAPY NEURO TREATMENT AND DISCHARGE  Patient Name: [name] Page MRN: [id number] DOB:1945-10-23, 78 y.o., female Today's Date: 10/15/2023  OCCUPATIONAL THERAPY DISCHARGE SUMMARY  Visits from Start of Care: 8  Current functional level related to goals / functional outcomes: Patient has met all short and long-term goals to date.   Remaining deficits: Arthritis in hands B with chronic pain and edema   Education / Equipment: Continue with therapy recommendations following OT d/c as needed to manage pain and edema while maximizing functional use.    Patient agrees to discharge. Patient goals were met. Patient is being discharged due to meeting the stated rehab goals.Marland Kitchen  PCP: Lockie Mola, MD REFERRING PROVIDER: Westley Chandler, MD   END OF SESSION:  OT End of Session - 10/15/23 1455     Visit Number 8    Number of Visits 9   including eval   Date for OT Re-Evaluation 10/18/23    Authorization Type [employer] Medicare, auth required    Authorization Time Period 1/28-3/25/25 Osteoarthritis of both hands    Authorization - Number of Visits 9    Progress Note Due on Visit 10    OT Start Time 1453    OT Stop Time 1531    OT Time Calculation (min) 38 min    Activity Tolerance Patient tolerated treatment well    Behavior During Therapy WFL for tasks assessed/performed            Past Medical History:  Diagnosis Date   Allergic rhinitis 09/19/2006   Qualifier: Diagnosis of  By: Luz Brazen MD, Jessica     Allergy    Arthritis    Bove tarry stools    from iron pills   CHF (congestive heart failure) (HCC) 1997   Colon polyps 2005   COPD (chronic obstructive pulmonary disease) (HCC)    GERD (gastroesophageal reflux disease)    Heart attack (HCC) 1997 and 2001   Hyperlipidemia    Hypertension    Otomycosis of both ears 12/28/2019   Pneumonia 2012   Status post dilation of esophageal narrowing    TMJ dysfunction 03/27/2019   Past Surgical History:   Procedure Laterality Date   CESAREAN SECTION  01/1976   COLONOSCOPY     CORONARY ANGIOPLASTY WITH STENT PLACEMENT  2002   POLYPECTOMY     Patient Active Problem List   Diagnosis Date Noted   Osteoarthritis of both hands 04/11/2023   Dyshidrotic eczema 06/08/2022   Ganglion cyst of both wrists 06/08/2022   Vacuolar interface dermatitis 06/08/2021   Hyponatremia 11/23/2020   Grief reaction 11/23/2020   Atypical chest pain 09/28/2020   Dry skin dermatitis 09/28/2020   Systolic murmur 06/30/2020   Hair loss 09/19/2017   Osteopenia 11/04/2014   Polyarthralgia 10/17/2012   COPD (chronic obstructive pulmonary disease) (HCC) 10/01/2011   Restrictive lung disease 10/01/2011   GERD (gastroesophageal reflux disease) 11/20/2006   HYPERCHOLESTEROLEMIA 09/19/2006   Smoker 09/19/2006   Essential hypertension 09/19/2006   Coronary atherosclerosis 09/19/2006   CLAUDICATION, INTERMITTENT 09/19/2006    ONSET DATE: 07/23/2023 (referral date)  REFERRING DIAG: M19.041,M19.042 (ICD-10-CM) - Osteoarthritis of both hands, unspecified osteoarthritis type   THERAPY DIAG:  Pain in joint of right hand  Pain in joint of left hand  Muscle weakness (generalized)  Other lack of coordination  Other symptoms and signs involving the musculoskeletal system  Other disturbances of skin sensation  Rationale for Evaluation and Treatment: Rehabilitation  SUBJECTIVE:   SUBJECTIVE STATEMENT:   She still  feels like she is better able to make fists since starting therapy. She is confident in HEP completion following OT d/c as needed to manage chronic symptoms.   Pt accompanied by: self  PERTINENT HISTORY: hx of tobacco use and subsequent cessation, T2DM, HTN, OA, CAD, COPD   Per 07/23/23 referral notes: "Hand OA and right shoulder OA  Very difficult for her to do everyday tasks, frequently gets ganglion cysts"  Per 07/12/23 MD Progress Notes: "Patient says her hand pain and swelling has gotten much  worse. She is not able to open jars or use scissors or a knife. She is unable to hold a cup. She feels her fingers and wrists are more swollen. She also notes a "knot" on the dorsal surface of her Left hand and on the radial side of her right wrist. She says she has had knots like this before on her hands/wrist and they are very painful and go away on their own. She says celebrex has helped her hand pain and swelling previously. Says voltaren gel is not doing enough at this time. She has tried splints but does not appreciate much relief. Has gone to rheumatology almost 5 years ago but feels her hand pain has gotten much worse now."   PRECAUTIONS: Fall   WEIGHT BEARING RESTRICTIONS: No  PAIN:  Are you having pain? Yes: NPRS scale: R/L - 1/10 Pain location: B wrists, fingers feel pretty good Pain description: sharp, stabbing Aggravating factors: morning time Relieving factors: afternoon time "usually not as bad"  FALLS: Has patient fallen in last 6 months? No  LIVING ENVIRONMENT: Lives with: lives alone Lives in: House/apartment Stairs: No Has following equipment at home: Dan Humphreys - 2 wheeled  PLOF: Pt reported symptoms worsened about 2 months ago "out of the blue," unsure of any causative factors. Pt reported 2 months ago, pt could complete ADL/IADL tasks, including grooming, dressing, driving, and household management.  PATIENT GOALS: "to get back some control of my hands"  OBJECTIVE:  Note: Objective measures were completed at Evaluation unless otherwise noted.  HAND DOMINANCE: Right  ADLs: Overall ADLs:  Eating: ind in P.M., avoids eating breakfast because increased difficulty with grasping objects in A.M. Grooming: Pt reported difficulty lifting arms overhead and cannot hold onto brush for grooming tasks. UB Dressing: ind with significantly extra time, avoiding buttons/zippers or use of large buttons LB Dressing: ind with slip-on shoes, elastic waist pants, extra time: "takes me  20 minutes to put on a pair of socks" Toileting: ind, some difficulty with wiping, per pt: limiting water intake and avoiding drinking as much water to avoid toileting tasks as frequently secondary to pain. Bathing: ind with loofah sponge, difficulty holding shower brush, pain with loofah sponge wrapped around pain Tub Shower transfers: ind Equipment:  tub/shower  IADLs: Shopping: daughter assists Light housekeeping: difficulty "I'm doing the best I can." Meal Prep: ind for simple meals, unable to cut up items d/t difficulty grasping knife and pt concerns about safety Community mobility: daughter assists  Handwriting:  Pt reported "handwriting is not as good." Pt reported not frequently handwriting.  MOBILITY STATUS: Independent  POSTURE COMMENTS:  rounded shoulders and forward head Sitting balance:  ind  ACTIVITY TOLERANCE: Activity tolerance: Pt reported poor sleep quality d/t pain which limits energy.  FUNCTIONAL OUTCOME MEASURES: Quick Dash: 70.5% deficit  10/15/2023: 22.7 % deficit   UPPER EXTREMITY ROM:    Active ROM Right eval Left eval  Shoulder flexion WFL (painful) Severe pain at approx. 60*, ultimately  achieved Approx. 110* with report "like a knife sticking" in shoulder  Shoulder abduction WFL (painful, "it feels like it wants to pop")   Shoulder adduction    Shoulder extension    Shoulder internal rotation    Shoulder external rotation    Elbow flexion [facility] WFL  Elbow extension [facility] WFL though extremely hesitant with movement, limited by pain  Wrist flexion 30* 20*  Wrist extension 58* 5*  Wrist ulnar deviation    Wrist radial deviation    Wrist pronation [facility] WFL  Wrist supination WFL WFL  (Blank rows = not tested)  Pt demo'd slower, hesitant movements with BUE though especially with LUE.  AROM RUE opposition between thumb and pinky - 0 cm AROM LUE opposition between thumb and pinky - 0.5 cm AROM Composite fist LUE - approx. 3.5 cm between digits and  palm, impaired AROM Composite fist RUE - approx. 2 cm between digits and palm, impaired  PROM composite fist BUE - impaired, similar flex of digits as compared to AROM  HAND FUNCTION: Tip pinch: Right 2 lbs, Left: 2 lbs  COORDINATION: 9-hold peg test: R: 36 seconds. L: 42 seconds.  Ataxic movements, large "shaking" movements of BUE.  SENSATION: Pt reported tingling/numbness at tips of digits 2-4 on R and digits 2-5 on L side.  EDEMA: Noted at joints   MUSCLE TONE: RUE: Rigidity and LUE: Rigidity  COGNITION: Overall cognitive status: Within functional limits for tasks assessed  PRAXIS: Not tested  OBSERVATIONS: Pt ambulated ind without A/E though with unsteady gait. Pt severely limited by pain with BUE movements, more impairment and limitations on L side compared to R side. Pt demo'd skin sloughing off on ulnar side of R palm, and pt reported hx of eczema and "popping blisters" (fluid-filled) on R palm yesterday. Pt demo'd moderate swelling at several joints of digits of BUE.                                                                                                                           TREATMENT:    - Self-care/home management completed for duration as noted below including: Therapist reviewed goals with patient and updated patient progression.  No additional functional limitations identified. Objective measures assessed as noted in Goals section to determine progression towards goals. OT educated pt on use of rocker knife for improved joint protection while cutting food.  Pt demonstrated good understanding of donning and doffing of L compression sleeve over palm and wrist as needed for edema and pain reduction. Independent with wear and care.  PATIENT EDUCATION: Education details: Wrist ROM, jt protection, splints  Person educated: Patient Education method: Explanation, Demonstration, Tactile cues, Verbal cues, and Handouts Education comprehension: verbalized  understanding, returned demonstration, verbal cues required, tactile cues required, and needs further education  HOME EXERCISE PROGRAM: 08/28/23: Tendon Gliding Exercises, Sleep positions and edema control 09/04/23: Shoulder ROM - table slides Access Code: [email or web address] 09/18/23: Coordination Activities 10/02/23: Wrist/forearm ROM - same access  code, jt protection   GOALS: Goals reviewed with patient? Yes  SHORT TERM GOALS: Target date: 09/20/23  Patient will demonstrate updated BUE HEP with no more than min v/c and visual handouts. Baseline: new to outpt OT Goal status: MET  2.  Pt will ind recall wear and care schedule for splinting/gloves options. Baseline: No current splint/gloves. Pt recently ordered arthritis gloves which will arrive soon. Goal status: MET 10/09/23 - pt brought home splints/gloves and has ordered additional options  3.  Pt will recall at least 2 pain management strategies to decrease BUE pain. Baseline: new to outpt OT,   6/10 pain at rest which increases with BUE movement (worse on L side compared to R side) Goal status: MET 10/09/23 - Tylenol arthritis, ROM activities, splints  4.  Pt will verbalize understanding of sleep positioning strategies to protect BUE joints, decrease pain, and improve sleep quality. Baseline: new to outpt OT. Pt reported difficulty with sleep secondary to pain. Goal status: MET  5.  Pt will recall at least 3 joint protection strategies. Baseline: new to outpt OT Goal status: MET 10/09/23 - use 2 hands and splints, modify her grasp, taking breaks  6.  Pt will return demonstration of desensitization strategies to reduce pain/discomfort of BUE. Baseline: new to outpt OT Goal status: MET - splints and gentle ROM  LONG TERM GOALS: Target date: 10/18/23  Using adaptive strategies and A/E PRN, pt will demo improved functional grasp and pinch with BUE to improve participation in ADL tasks. Baseline: Pt reported difficulty with grasping objects  for ADL tasks, including but not limited to eating utensils, cup, brush, long-handled sponge, and pen. Goal status: MET  2.  Pt will verbalize understanding of adaptive strategies and A/E to improve ind and decrease BUE pain for ADL bathing and toileting tasks. Baseline: Pt reported difficulty with bathing and toileting tasks secondary to BUE pain Goal status: MET  3.  Patient will demonstrate at least 16% improvement with quick Dash score (reporting 54.5% disability or less) indicating improved functional use of affected extremity.  Baseline: Quick Dash: 70.5% deficit 10/15/2023: 22.7 % deficit Goal status: MET  4.  Patient will demo improved FM coordination as evidenced by completing nine-hole peg in 31 seconds or less with RUE and 37 seconds or less with LUE.  Baseline: 9-hold peg test: R: 36 seconds. L: 42 seconds. 10/15/2023: R: 27 seconds; L: 25 seconds. Goal status: MET  ASSESSMENT:  CLINICAL IMPRESSION: Patient is appropriate for discharge and no longer demonstrates medical necessity for continued skilled occupational therapy services. Would recommend pt obtain new referral in 6 or more months should additional services be needed. In meantime, pt is independent with HEP and symptom management to maintain functional status.   PERFORMANCE DEFICITS: in functional skills including ADLs, IADLs, coordination, dexterity, proprioception, sensation, edema, ROM, strength, pain, flexibility, Fine motor control, Gross motor control, mobility, balance, body mechanics, endurance, decreased knowledge of use of DME, and UE functional use, cognitive skills including  none , and psychosocial skills including environmental adaptation.   IMPAIRMENTS: are limiting patient from ADLs, IADLs, rest and sleep, leisure, and social participation.   CO-MORBIDITIES: may have co-morbidities  that affects occupational performance. Patient will benefit from skilled OT to address above impairments and improve overall  function.  REHAB POTENTIAL: Good  PLAN:  OT D/C Completed  Delana Meyer, OT 10/15/2023, 3:39 PM

## 2023-10-16 ENCOUNTER — Encounter: Payer: Medicare Other | Admitting: Occupational Therapy

## 2023-10-26 ENCOUNTER — Other Ambulatory Visit: Payer: Self-pay | Admitting: Family Medicine

## 2023-10-26 ENCOUNTER — Other Ambulatory Visit: Payer: Self-pay | Admitting: Cardiology

## 2023-10-26 DIAGNOSIS — I2510 Atherosclerotic heart disease of native coronary artery without angina pectoris: Secondary | ICD-10-CM

## 2023-10-26 DIAGNOSIS — I10 Essential (primary) hypertension: Secondary | ICD-10-CM

## 2023-11-14 ENCOUNTER — Other Ambulatory Visit: Payer: Self-pay | Admitting: Family Medicine

## 2023-11-14 DIAGNOSIS — I10 Essential (primary) hypertension: Secondary | ICD-10-CM

## 2023-11-15 MED ORDER — HYDROCHLOROTHIAZIDE 12.5 MG PO TABS: 12.5000 mg | ORAL_TABLET | Freq: Every day | ORAL | 3 refills | Status: AC

## 2023-11-25 ENCOUNTER — Other Ambulatory Visit: Payer: Self-pay | Admitting: Family Medicine

## 2023-11-25 DIAGNOSIS — I739 Peripheral vascular disease, unspecified: Secondary | ICD-10-CM

## 2023-12-24 ENCOUNTER — Telehealth: Payer: Self-pay | Admitting: Family Medicine

## 2023-12-24 NOTE — Telephone Encounter (Signed)
 Patient has a "Annual Care Visit" gap per [facility]. Called patient to schedule an annual physical with their PCP.

## 2023-12-30 ENCOUNTER — Other Ambulatory Visit: Payer: Self-pay | Admitting: Family Medicine

## 2023-12-30 ENCOUNTER — Other Ambulatory Visit: Payer: Self-pay | Admitting: Cardiology

## 2023-12-30 DIAGNOSIS — I10 Essential (primary) hypertension: Secondary | ICD-10-CM

## 2024-01-09 ENCOUNTER — Ambulatory Visit: Payer: Medicare Other

## 2024-01-09 VITALS — BP 118/60 | Ht 62.0 in | Wt 140.0 lb

## 2024-01-09 DIAGNOSIS — Z0000 Encounter for general adult medical examination without abnormal findings: Secondary | ICD-10-CM

## 2024-01-09 NOTE — Progress Notes (Signed)
 Because this visit was a virtual/telehealth visit,  certain criteria was not obtained, such a blood pressure, CBG if applicable, and timed get up and go. Any medications not marked as taking were not mentioned during the medication reconciliation part of the visit. Any vitals not documented were not able to be obtained due to this being a telehealth visit or patient was unable to self-report a recent blood pressure reading due to a lack of equipment at home via telehealth. Vitals that have been documented are verbally provided by the patient.   This visit was performed by a medical professional under my direct supervision. I was immediately available for consultation/collaboration. I have reviewed and agree with the Annual Wellness Visit documentation.  Subjective:   [name] Page is a 78 y.o. who presents for a Medicare Wellness preventive visit.  As a reminder, Annual Wellness Visits don't include a physical exam, and some assessments may be limited, especially if this visit is performed virtually. We may recommend an in-person follow-up visit with your provider if needed.  Visit Complete: Virtual I connected with  [name] Page on 01/09/24 by a audio enabled telemedicine application and verified that I am speaking with the correct person using two identifiers.  Patient Location: Home  Provider Location: Home Office  I discussed the limitations of evaluation and management by telemedicine. The patient expressed understanding and agreed to proceed.  Vital Signs: Because this visit was a virtual/telehealth visit, some criteria may be missing or patient reported. Any vitals not documented were not able to be obtained and vitals that have been documented are patient reported.  VideoDeclined- This patient declined Librarian, academic. Therefore the visit was completed with audio only.  Persons Participating in Visit: Patient.  AWV Questionnaire: No: Patient  Medicare AWV questionnaire was not completed prior to this visit.  Cardiac Risk Factors include: advanced age (>104men, >52 women);Other (see comment);hypertension, Risk factor comments: copd     Objective:    Today's Vitals   01/09/24 1133  BP: 118/60  Weight: 140 lb (63.5 kg)  Height: 5' 2 (1.575 m)   Body mass index is 25.61 kg/m.     01/09/2024   11:33 AM 10/14/2023    1:47 PM 08/20/2023    3:33 PM 01/14/2023    4:11 PM 01/07/2023    1:50 PM 06/08/2022    2:04 PM 01/19/2022    3:16 PM  Advanced Directives  Does Patient Have a Medical Advance Directive? No No Yes No No No No  Does patient want to make changes to medical advance directive?   No - Patient declined      Would patient like information on creating a medical advance directive? No - Patient declined No - Patient declined  Yes (MAU/Ambulatory/Procedural Areas - Information given) No - Patient declined No - Patient declined     Current Medications (verified) Outpatient Encounter Medications as of 01/09/2024  Medication Sig   amLODipine  (NORVASC ) 10 MG tablet TAKE 1 TABLET(10 MG) BY MOUTH DAILY   aspirin  EC 81 MG tablet Take 81 mg by mouth daily.   bisoprolol  (ZEBETA ) 10 MG tablet Take 1 tablet (10 mg total) by mouth daily.   cetirizine  (ZYRTEC ) 10 MG tablet TAKE 1 TABLET(10 MG) BY MOUTH DAILY   cilostazol  (PLETAL ) 100 MG tablet TAKE 1 TABLET(100 MG) BY MOUTH TWICE DAILY   diclofenac  Sodium (VOLTAREN ) 1 % GEL Apply 4 g topically 4 (four) times daily as needed.   famotidine  (PEPCID ) 20 MG tablet  TAKE 1 TABLET BY MOUTH UP TO TWICE DAILY AS NEEDED   hydrochlorothiazide  (HYDRODIURIL ) 12.5 MG tablet Take 1 tablet (12.5 mg total) by mouth daily.   losartan  (COZAAR ) 100 MG tablet TAKE 1 TABLET(100 MG) BY MOUTH DAILY   neomycin -polymyxin-hydrocortisone  (CORTISPORIN) 3.5-10000-1 OTIC suspension Place 3 drops into both ears 4 (four) times daily.   nicotine  polacrilex (COMMIT) 4 MG lozenge Take 1 lozenge (4 mg total) by mouth as  needed for smoking cessation.   pravastatin  (PRAVACHOL ) 40 MG tablet TAKE 1 TABLET(40 MG) BY MOUTH DAILY   No facility-administered encounter medications on file as of 01/09/2024.    Allergies (verified) Lisinopril and Penicillins   History: Past Medical History:  Diagnosis Date   Allergic rhinitis 09/19/2006   Qualifier: Diagnosis of  By: Lamona Pilon MD, Jessica     Allergy    Arthritis    Harpster tarry stools    from iron pills   CHF (congestive heart failure) (HCC) 1997   Colon polyps 2005   COPD (chronic obstructive pulmonary disease) (HCC)    GERD (gastroesophageal reflux disease)    Heart attack (HCC) 1997 and 2001   Hyperlipidemia    Hypertension    Otomycosis of both ears 12/28/2019   Pneumonia 2012   Status post dilation of esophageal narrowing    TMJ dysfunction 03/27/2019   Past Surgical History:  Procedure Laterality Date   CESAREAN SECTION  01/1976   COLONOSCOPY     CORONARY ANGIOPLASTY WITH STENT PLACEMENT  2002   POLYPECTOMY     Family History  Problem Relation Age of Onset   Colon cancer Mother 21       EARLY 45'S   Cancer Mother        colon   Heart disease Father    Diabetes Sister    Esophageal cancer Sister    Clotting disorder Brother    Rectal cancer Neg Hx    Stomach cancer Neg Hx    Social History   Socioeconomic History   Marital status: Legally Separated    Spouse name: [name] Page   Number of children: 2   Years of education: 14   Highest education level: Some college, no degree  Occupational History   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    Patient has close relationships with her daughter, [name] Page, and surrounding family in the area.    Patient enjoys cooking and writing poetry. 8.23.22 - pt does cook as much. Pt enjoys word searches and reading.     Patient is separated from her husband, however they maintain a close friendship.    Social Drivers of Corporate investment banker Strain: Low Risk  (01/09/2024)   Overall Financial Resource Strain (CARDIA)    Difficulty of Paying Living Expenses: Not hard at all  Food Insecurity: No Food Insecurity (01/09/2024)  Hunger Vital Sign    Worried About Running Out of Food in the Last Year: Never true    Ran Out of Food in the Last Year: Never true  Transportation Needs: No Transportation Needs (01/09/2024)   PRAPARE - Administrator, Civil Service (Medical): No    Lack of Transportation (Non-Medical): No  Physical Activity: Sufficiently Active (01/09/2024)   Exercise Vital Sign    Days of Exercise per Week: 5 days    Minutes of Exercise per Session: 30 min  Stress: No Stress Concern Present (01/09/2024)   [employer] of Occupational Health - Occupational Stress Questionnaire    Feeling of Stress: Not at all  Social Connections: Socially Isolated (01/09/2024)   Social Connection and Isolation Panel    Frequency of Communication with Friends and Family: More than three times a week    Frequency of Social Gatherings with Friends and Family: Twice a week    Attends Religious Services: Never    Database administrator or Organizations: No    Attends Engineer, structural: Never    Marital Status: Separated    Tobacco Counseling Ready to quit: Not Answered Counseling given: Not Answered Tobacco comments: working on quitting Using gum and lozenges     Clinical Intake:  Pre-visit preparation completed: Yes  Pain :  No/denies pain     BMI - recorded: 25.61 Nutritional Status: BMI 25 -29 Overweight Nutritional Risks: None Diabetes: No  No results found for: HGBA1C   How often do you need to have someone help you when you read instructions, pamphlets, or other written materials from your doctor or pharmacy?: 1 - Never What is the last grade level you completed in school?: some college  Interpreter Needed?: No  Information entered by :: [employer]   Activities of Daily Living     01/09/2024   11:39 AM 01/14/2023   10:52 AM  In your present state of health, do you have any difficulty performing the following activities:  Hearing? 0 0  Vision? 0 0  Difficulty concentrating or making decisions? 0 0  Walking or climbing stairs? 0 0  Dressing or bathing? 0 0  Doing errands, shopping? 0 0  Preparing Food and eating ? N N  Using the Toilet? N N  In the past six months, have you accidently leaked urine? Y N  Do you have problems with loss of bowel control? N N  Managing your Medications? N N  Managing your Finances? N N  Housekeeping or managing your Housekeeping? N N    Patient Care Team: Santa Cuba, MD as PCP - General (Family Medicine) Hugh Madura, MD as PCP - Cardiology (Cardiology) Nathen Balder Jewel Mortimer, MD as Attending Physician (Cardiology)  I have updated your Care Teams any recent Medical Services you may have received from other providers in the past year.     Assessment:   This is a routine wellness examination for Joseph.  Hearing/Vision screen Hearing Screening - Comments:: Patient declined hearing difficulties  Vision Screening - Comments:: Patient wears OTC readers    Goals Addressed             This Visit's Progress    Quit smoking / using tobacco   On track    Patient has been smoking more due to the stress of COVID and a recent move.        Depression Screen     01/09/2024   11:43 AM 10/14/2023  1:46 PM 07/12/2023    2:28 PM 04/11/2023     1:23 PM 01/14/2023   10:51 AM 06/08/2022    2:04 PM 01/19/2022    3:18 PM  PHQ 2/9 Scores  PHQ - 2 Score 2 0 0 0 0 0 0  PHQ- 9 Score 2 0 3 3  0 1    Fall Risk     01/09/2024   11:39 AM 10/14/2023    1:46 PM 07/12/2023    2:19 PM 04/11/2023    1:23 PM 01/14/2023   10:52 AM  Fall Risk   Falls in the past year? 0 0 0 0 0  Number falls in past yr: 0 0 0 0 0  Injury with Fall? 0 0 0  0  Risk for fall due to : No Fall Risks    No Fall Risks  Follow up Falls evaluation completed    Falls prevention discussed;Education provided;Falls evaluation completed    MEDICARE RISK AT HOME:  Medicare Risk at Home Any stairs in or around the home?: No If so, are there any without handrails?: No Home free of loose throw rugs in walkways, pet beds, electrical cords, etc?: Yes Adequate lighting in your home to reduce risk of falls?: Yes Life alert?: No Use of a cane, walker or w/c?: No Grab bars in the bathroom?: No Shower chair or bench in shower?: No Elevated toilet seat or a handicapped toilet?: No  TIMED UP AND GO:  Was the test performed?  No  Cognitive Function: 6CIT completed        01/09/2024   11:38 AM 01/14/2023    4:10 PM 03/14/2021    6:59 PM 02/02/2019   10:00 AM  6CIT Screen  What Year? 0 points 0 points 0 points 0 points  What month? 0 points 0 points 0 points 0 points  What time? 0 points 0 points 0 points 0 points  Count back from 20 0 points 0 points 0 points 0 points  Months in reverse 0 points 0 points 0 points 0 points  Repeat phrase 0 points 0 points 0 points 0 points  Total Score 0 points 0 points 0 points 0 points    Immunizations Immunization History  Administered Date(s) Administered   Fluad Quad(high Dose 65+) 06/29/2020, 06/08/2021, 05/04/2022   Fluad Trivalent(High Dose 65+) 04/11/2023   Influenza Split 05/23/2011   Influenza,inj,Quad PF,6+ Mos 05/18/2013, 05/17/2014, 05/12/2015, 07/26/2016, 04/17/2017, 05/06/2018, 03/27/2019   PFIZER(Purple  Top)SARS-COV-2 Vaccination 09/19/2019, 10/17/2019, 07/05/2020   Pneumococcal Conjugate-13 12/08/2014   Pneumococcal Polysaccharide-23 05/23/2011   Td 11/20/2005   Tdap 12/30/2019    Screening Tests Health Maintenance  Topic Date Due   Zoster Vaccines- Shingrix (1 of 2) Never done   COVID-19 Vaccine (4 - 2024-25 season) 03/24/2023   INFLUENZA VACCINE  02/21/2024   Medicare Annual Wellness (AWV)  01/08/2025   DTaP/Tdap/Td (3 - Td or Tdap) 12/29/2029   Pneumococcal Vaccine: 50+ Years  Completed   DEXA SCAN  Completed   Hepatitis C Screening  Completed   HPV VACCINES  Aged Out   Meningococcal B Vaccine  Aged Out   Colonoscopy  Discontinued    Health Maintenance  Health Maintenance Due  Topic Date Due   Zoster Vaccines- Shingrix (1 of 2) Never done   COVID-19 Vaccine (4 - 2024-25 season) 03/24/2023   Health Maintenance Items Addressed: Vaccinations declined   Additional Screening:  Vision Screening: Recommended annual ophthalmology exams for early detection of glaucoma and other disorders of  the eye. Would you like a referral to an eye doctor? No    Dental Screening: Recommended annual dental exams for proper oral hygiene  Community Resource Referral / Chronic Care Management: CRR required this visit?  No   CCM required this visit?  No   Plan:    I have personally reviewed and noted the following in the patient's chart:   Medical and social history Use of alcohol, tobacco or illicit drugs  Current medications and supplements including opioid prescriptions. Patient is not currently taking opioid prescriptions. Functional ability and status Nutritional status Physical activity Advanced directives List of other physicians Hospitalizations, surgeries, and ER visits in previous 12 months Vitals Screenings to include cognitive, depression, and falls Referrals and appointments  In addition, I have reviewed and discussed with patient certain preventive protocols,  quality metrics, and best practice recommendations. A written personalized care plan for preventive services as well as general preventive health recommendations were provided to patient.   Freeda Jerry, [place]   01/09/2024   After Visit Summary: (MyChart) Due to this being a telephonic visit, the after visit summary with patients personalized plan was offered to patient via MyChart   Notes: Nothing significant to report at this time.

## 2024-01-09 NOTE — Patient Instructions (Signed)
 [name] Page , Thank you for taking time out of your busy schedule to complete your Annual Wellness Visit with me. I enjoyed our conversation and look forward to speaking with you again next year. I, as well as your care team,  appreciate your ongoing commitment to your health goals. Please review the following plan we discussed and let me know if I can assist you in the future. Your Game plan/ To Do List    Referrals: If you haven't heard from the office you've been referred to, please reach out to them at the phone provided.  none Follow up Visits: Next Medicare AWV with our clinical staff: 01/12/2024   Have you seen your provider in the last 6 months (3 months if uncontrolled diabetes)? No Next Office Visit with your provider:   Clinician Recommendations:  Aim for 30 minutes of exercise or brisk walking, 6-8 glasses of water, and 5 servings of fruits and vegetables each day.       This is a list of the screening recommended for you and due dates:  Health Maintenance  Topic Date Due   Zoster (Shingles) Vaccine (1 of 2) Never done   COVID-19 Vaccine (4 - 2024-25 season) 03/24/2023   Flu Shot  02/21/2024   Medicare Annual Wellness Visit  01/08/2025   DTaP/Tdap/Td vaccine (3 - Td or Tdap) 12/29/2029   Pneumococcal Vaccine for age over 53  Completed   DEXA scan (bone density measurement)  Completed   Hepatitis C Screening  Completed   HPV Vaccine  Aged Out   Meningitis B Vaccine  Aged Out   Colon Cancer Screening  Discontinued    Advanced directives: (Declined) Advance directive discussed with you today. Even though you declined this today, please call our office should you change your mind, and we can give you the proper paperwork for you to fill out. Advance Care Planning is important because it:  [x]  Makes sure you receive the medical care that is consistent with your values, goals, and preferences  [x]  It provides guidance to your family and loved ones and reduces their decisional  burden about whether or not they are making the right decisions based on your wishes.  Follow the link provided in your after visit summary or read over the paperwork we have mailed to you to help you started getting your Advance Directives in place. If you need assistance in completing these, please reach out to us  so that we can help you!  See attachments for Preventive Care and Fall Prevention Tips.

## 2024-01-27 ENCOUNTER — Other Ambulatory Visit: Payer: Self-pay | Admitting: Family Medicine

## 2024-01-27 DIAGNOSIS — K219 Gastro-esophageal reflux disease without esophagitis: Secondary | ICD-10-CM

## 2024-02-21 ENCOUNTER — Other Ambulatory Visit: Payer: Self-pay | Admitting: Student

## 2024-02-21 DIAGNOSIS — I739 Peripheral vascular disease, unspecified: Secondary | ICD-10-CM

## 2024-02-26 ENCOUNTER — Other Ambulatory Visit: Payer: Self-pay

## 2024-02-26 MED ORDER — BISOPROLOL FUMARATE 10 MG PO TABS: 10.0000 mg | ORAL_TABLET | Freq: Every day | ORAL | 2 refills | Status: DC

## 2024-02-27 ENCOUNTER — Other Ambulatory Visit: Payer: Self-pay

## 2024-02-27 MED ORDER — BISOPROLOL FUMARATE 10 MG PO TABS: 10.0000 mg | ORAL_TABLET | Freq: Every day | ORAL | 2 refills | Status: AC

## 2024-04-23 ENCOUNTER — Other Ambulatory Visit: Payer: Self-pay | Admitting: Family Medicine

## 2024-04-23 DIAGNOSIS — I10 Essential (primary) hypertension: Secondary | ICD-10-CM

## 2024-04-29 ENCOUNTER — Encounter: Payer: Self-pay | Admitting: Family Medicine

## 2024-04-29 ENCOUNTER — Ambulatory Visit (INDEPENDENT_AMBULATORY_CARE_PROVIDER_SITE_OTHER): Admitting: Family Medicine

## 2024-04-29 VITALS — BP 118/71 | HR 72 | Ht 62.0 in | Wt 141.0 lb

## 2024-04-29 DIAGNOSIS — Z720 Tobacco use: Secondary | ICD-10-CM | POA: Diagnosis not present

## 2024-04-29 DIAGNOSIS — H608X3 Other otitis externa, bilateral: Secondary | ICD-10-CM | POA: Diagnosis not present

## 2024-04-29 DIAGNOSIS — H6993 Unspecified Eustachian tube disorder, bilateral: Secondary | ICD-10-CM

## 2024-04-29 NOTE — Patient Instructions (Signed)
 It was wonderful to see you today.  Please bring ALL of your medications with you to every visit.   Today we talked about:  Ear irritation - I am sorry that the ointment did not work. I am going to do an ENT referral to see if there is anything else they could offer you. For your congestion in your ears and ear pain with swallowing please try the flonase nasal spray. Make sure you spray to the outside of your nose.  They will call you to schedule this appointment   For your smoking, keep up the good work on trying to reduce your smoking. Keep using those lozenges and hopefully the class can help too.   Please follow up in 3 months to discuss smoking   Thank you for choosing [facility].   Please call [phone] with any questions about today's appointment.  Please be sure to schedule follow up at the front desk before you leave today.   Areta Saliva, MD  Family Medicine

## 2024-04-29 NOTE — Progress Notes (Signed)
" ° ° °  SUBJECTIVE:   CHIEF COMPLAINT / HPI:   Discussed the use of AI scribe software for clinical note transcription with the patient, who gave verbal consent to proceed.  History of Present Illness [name] Page is a 78 year old female who presents with persistent ear discomfort and congestion.  Ear Discomfort  She experiences persistent discomfort in both ears, described as sensitive to touch and sometimes painful, especially when swallowing. These symptoms have been ongoing since at least March, when she was last seen and prescribed ear drops for eczematous otitis externa. However, she discontinued the drops after they caused crusting and hardening of fluid in her ears, worsening her symptoms. No use of Q-tips is reported, but her ears remain sensitive and sometimes itchy.  Her right ear is particularly bothersome, especially when sleeping on that side. She experiences a sensation of movement in her ear when swallowing, which is associated with pain. She has not been sick recently but has a history of spring allergies, for which she takes cetirizine  daily from spring to December. She also uses Flonase, although she sometimes experiences nasal soreness from its use.  Patient sprays straight up.  Tobacco cessation  She smokes approximately five cigarettes a day, with a pack lasting three to five days. She uses nicotine  gum, particularly at night and in the mornings, to help reduce smoking.  Patient states that she would rather smoke a cigarette then eat a meal and the smoking makes her have less appetite.  Patient is not necessarily using this to suppress her appetite.  She is thinking of going to a class with her friend that helps with smoking cessation.    PERTINENT  PMH / PSH: Hypertension, tobacco use, COPD, dyshidrotic eczema  OBJECTIVE:   BP 118/71 (BP Location: Left Arm, Patient Position: Standing, Cuff Size: Normal)   Pulse 72   Ht 5' 2 (1.575 m)   Wt 141 lb (64 kg)   SpO2  96%   BMI 25.79 kg/m   General: well appearing, in no acute distress  HEENT: bilateral ear canals with some flaking and hypopigmentation, minimal cerumen, bilateral TM with effusions, no bulging or erythema  CV: RRR, No m/r/g Resp: Normal work of breathing on room air    ASSESSMENT/PLAN:   Assessment & Plan Chronic eczematous otitis externa of both ears Patient continues to have discomfort from flaking and pruritic ear canals.  Had previously tried Cortisporin suspension and had worsening of symptoms.  She would like to see an ENT at this point due to her ear concerns. - ENT referral Eustachian tube dysfunction, bilateral Congestion most likely causing eustachian tube dysfunction.  This is most likely due to her environmental allergies. - Advised patient to continue cetirizine  and continue Flonase counseled patient to spray to the outside of her nares.  -Referral to ENT Tobacco use Chronic tobacco use, 5 cigarettes/day. Using nicotine  gum for cessation. - Encourage smoking cessation class attendance. - Continue nicotine  gum as needed. - Schedule follow-up in 3 months to assess cessation progress.    Areta Saliva, MD [facility]  "

## 2024-04-30 DIAGNOSIS — Z720 Tobacco use: Secondary | ICD-10-CM | POA: Insufficient documentation

## 2024-04-30 NOTE — Assessment & Plan Note (Signed)
 Chronic tobacco use, 5 cigarettes/day. Using nicotine  gum for cessation. - Encourage smoking cessation class attendance. - Continue nicotine  gum as needed. - Schedule follow-up in 3 months to assess cessation progress.

## 2024-05-14 ENCOUNTER — Encounter (INDEPENDENT_AMBULATORY_CARE_PROVIDER_SITE_OTHER): Payer: Self-pay

## 2024-05-21 ENCOUNTER — Other Ambulatory Visit: Payer: Self-pay | Admitting: Family Medicine

## 2024-05-21 DIAGNOSIS — I739 Peripheral vascular disease, unspecified: Secondary | ICD-10-CM

## 2024-05-26 ENCOUNTER — Ambulatory Visit (INDEPENDENT_AMBULATORY_CARE_PROVIDER_SITE_OTHER): Admitting: Otolaryngology

## 2024-05-26 ENCOUNTER — Encounter (INDEPENDENT_AMBULATORY_CARE_PROVIDER_SITE_OTHER): Payer: Self-pay | Admitting: Otolaryngology

## 2024-05-26 VITALS — BP 131/66 | HR 74 | Temp 98.8°F | Ht 62.0 in | Wt 143.0 lb

## 2024-05-26 DIAGNOSIS — H6090 Unspecified otitis externa, unspecified ear: Secondary | ICD-10-CM

## 2024-05-26 DIAGNOSIS — H60313 Diffuse otitis externa, bilateral: Secondary | ICD-10-CM

## 2024-05-26 MED ORDER — FLUOCINOLONE ACETONIDE 0.01 % OT OIL: 4.0000 [drp] | TOPICAL_OIL | Freq: Three times a day (TID) | OTIC | 0 refills | Status: DC

## 2024-05-26 NOTE — Progress Notes (Signed)
 Reason for Consult: Otitis externa Referring Physician: Dr. Keneth Wanda BIRCH [name] Page is an 78 y.o. female.  HPI: Patient with a history of itching of both ears.  This been going on for years but maybe a little worse in the last year.  She has been tried on several drops but she does not recall which and the last 1 was Cortisporin.  She has no drainage from either ear.  She was told she has fluid in the ear.  She does not have any hearing loss.  No vertigo.  She has eczema in other areas of her body including her scalp.  Past Medical History:  Diagnosis Date   Allergic rhinitis 09/19/2006   Qualifier: Diagnosis of  By: Cheyenne MD, Jessica     Allergy    Arthritis    Wetherell tarry stools    from iron pills   CHF (congestive heart failure) (HCC) 1997   Colon polyps 2005   COPD (chronic obstructive pulmonary disease) (HCC)    GERD (gastroesophageal reflux disease)    Heart attack (HCC) 1997 and 2001   Hyperlipidemia    Hypertension    Otomycosis of both ears 12/28/2019   Pneumonia 2012   Status post dilation of esophageal narrowing    TMJ dysfunction 03/27/2019    Past Surgical History:  Procedure Laterality Date   CESAREAN SECTION  01/1976   COLONOSCOPY     CORONARY ANGIOPLASTY WITH STENT PLACEMENT  2002   POLYPECTOMY      Family History  Problem Relation Age of Onset   Colon cancer Mother 68       EARLY 57'S   Cancer Mother        colon   Heart disease Father    Diabetes Sister    Esophageal cancer Sister    Clotting disorder Brother    Rectal cancer Neg Hx    Stomach cancer Neg Hx     Social History:  reports that she has been smoking cigarettes. She started smoking about 17 months ago. She has a 0.3 pack-year smoking history. She has never used smokeless tobacco. She reports that she does not drink alcohol and does not use drugs.  Allergies:  Allergies  Allergen Reactions   Lisinopril Dermatitis and Rash   Penicillins Anaphylaxis and Swelling    Medications: I  have reviewed the patient's current medications.  No results found for this or any previous visit (from the past 48 hours).  No results found.  ROS Blood pressure 131/66, pulse 74, temperature 98.8 F (37.1 C), height 5' 2 (1.575 m), weight 143 lb (64.9 kg), SpO2 92%. Physical Exam Constitutional:      Appearance: Normal appearance.  HENT:     Head: Normocephalic and atraumatic.     Right/left ear: Tympanic membrane is without lesions and middle ear aerated, ear canal has some flaky skin debris but no exudate or inflammatory process.  The tympanic membrane is slightly thickened    .     Nose: Nose normal. Turbinates with mild hypertrophy, No significant swelling or masses.     Oral cavity/oropharynx: Mucous membranes are moist. No lesions or masses    Larynx: normal voice. Mirror attempted without success    Eyes:     Extraocular Movements: Extraocular movements intact.     Conjunctiva/sclera: Conjunctivae normal.     Pupils: Pupils are equal, round, and reactive to light.  Cardiovascular:     Rate and Rhythm: Normal rate.  Pulmonary:     Effort:  Pulmonary effort is normal.  Musculoskeletal:     Cervical back: Normal range of motion and neck supple. No rigidity.  Lymphadenopathy:     Cervical: No cervical adenopathy or masses.salivary glands without lesions. .  Neurological:     Mental Status: He is alert. CN 2-12 intact. No nystagmus      Assessment/Plan: Otitis externa-I suspect she has a eczema like process since she has it on her skin.  The canal is mostly just flaky with no exudate or infection appearance.  I am going to try her on Derm otic and see if this will help first and then proceed with various other drops as needed.  She will practice strict water precautions.  Norleen Notice 05/26/2024, 2:42 PM

## 2024-05-26 NOTE — Progress Notes (Signed)
 Patient is having BP managed. Took medication today. Patient explains that she got frustrated on the way here.

## 2024-06-02 ENCOUNTER — Telehealth (INDEPENDENT_AMBULATORY_CARE_PROVIDER_SITE_OTHER): Payer: Self-pay

## 2024-06-02 NOTE — Telephone Encounter (Signed)
 Pt called stating he had questions about the medication you prescribed her for her ears. Pt would like a call back.

## 2024-06-04 ENCOUNTER — Telehealth (INDEPENDENT_AMBULATORY_CARE_PROVIDER_SITE_OTHER): Payer: Self-pay

## 2024-06-04 ENCOUNTER — Other Ambulatory Visit (INDEPENDENT_AMBULATORY_CARE_PROVIDER_SITE_OTHER): Payer: Self-pay | Admitting: Otolaryngology

## 2024-06-04 MED ORDER — HYDROCORTISONE-ACETIC ACID 1-2 % OT SOLN: 4.0000 [drp] | Freq: Two times a day (BID) | OTIC | 2 refills | Status: AC

## 2024-06-04 NOTE — Telephone Encounter (Signed)
 Called patient to let them know know Dr. Roark had sent over a different medication that the patients insurance should cover. Patient understood.

## 2024-06-04 NOTE — Progress Notes (Signed)
 Called and wanted alternative to dermOtic

## 2024-06-23 ENCOUNTER — Ambulatory Visit (INDEPENDENT_AMBULATORY_CARE_PROVIDER_SITE_OTHER): Admitting: Otolaryngology

## 2024-06-23 ENCOUNTER — Encounter (INDEPENDENT_AMBULATORY_CARE_PROVIDER_SITE_OTHER): Payer: Self-pay | Admitting: Otolaryngology

## 2024-06-23 VITALS — BP 136/62 | HR 63 | Temp 97.9°F

## 2024-06-23 DIAGNOSIS — H6090 Unspecified otitis externa, unspecified ear: Secondary | ICD-10-CM

## 2024-06-23 DIAGNOSIS — H60313 Diffuse otitis externa, bilateral: Secondary | ICD-10-CM

## 2024-06-23 NOTE — Progress Notes (Signed)
 Reason for Consult: Otitis externa Referring Physician: Chancy Claros [name] is an 78 y.o. female.  HPI: Returns for her otitis externa.  She is profoundly better.  No further itching.  Finally got the medication straightened out as she could not get Derm otic but got VoSol  HC.  She is very happy.  Past Medical History:  Diagnosis Date   Allergic rhinitis 09/19/2006   Qualifier: Diagnosis of  By: Cheyenne MD, Jessica     Allergy    Arthritis    Zeoli tarry stools    from iron pills   CHF (congestive heart failure) (HCC) 1997   Colon polyps 2005   COPD (chronic obstructive pulmonary disease) (HCC)    GERD (gastroesophageal reflux disease)    Heart attack (HCC) 1997 and 2001   Hyperlipidemia    Hypertension    Otomycosis of both ears 12/28/2019   Pneumonia 2012   Status post dilation of esophageal narrowing    TMJ dysfunction 03/27/2019    Past Surgical History:  Procedure Laterality Date   CESAREAN SECTION  01/1976   COLONOSCOPY     CORONARY ANGIOPLASTY WITH STENT PLACEMENT  2002   POLYPECTOMY      Family History  Problem Relation Age of Onset   Colon cancer Mother 37       EARLY 21'S   Cancer Mother        colon   Heart disease Father    Diabetes Sister    Esophageal cancer Sister    Clotting disorder Brother    Rectal cancer Neg Hx    Stomach cancer Neg Hx     Social History:  reports that she has been smoking cigarettes. She started smoking about 18 months ago. She has a 0.4 pack-year smoking history. She has never used smokeless tobacco. She reports that she does not drink alcohol and does not use drugs.  Allergies:  Allergies  Allergen Reactions   Lisinopril Dermatitis and Rash   Penicillins Anaphylaxis and Swelling    Medications: I have reviewed the patient's current medications.  No results found for this or any previous visit (from the past 48 hours).  No results found.  ROS Blood pressure 136/62, pulse 63, temperature 97.9 F (36.6 C), SpO2  92%. Physical Exam Constitutional:      Appearance: Normal appearance.  HENT:     Head: Normocephalic and atraumatic.     Right/left ear: Tympanic membrane is without lesions and middle ear aerated, ear canal just has a slight amount of flaky skin debris otherwise  external ear normal.         Nose: Nose normal. Turbinates with mild hypertrophy, No significant swelling or masses.     Oral cavity/oropharynx: Mucous membranes are moist. No lesions or masses    Larynx: normal voice. Mirror attempted without success    Eyes:     Extraocular Movements: Extraocular movements intact.     Conjunctiva/sclera: Conjunctivae normal.     Pupils: Pupils are equal, round, and reactive to light.  Cardiovascular:     Rate and Rhythm: Normal rate.  Pulmonary:     Effort: Pulmonary effort is normal.  Musculoskeletal:     Cervical back: Normal range of motion and neck supple. No rigidity.  Lymphadenopathy:     Cervical: No cervical adenopathy or masses.salivary glands without lesions. .  Neurological:     Mental Status: He is alert. CN 2-12 intact. No nystagmus      Assessment/Plan: Otitis externa-she is doing well.  The VoSol  HC seems to be working and she has been using it about 3 weeks.  I would recommend she stop it and see if her symptoms come back.  She will follow-up as needed.  Norleen Notice 06/23/2024, 10:08 AM

## 2024-07-03 ENCOUNTER — Ambulatory Visit: Admitting: Family Medicine

## 2024-07-03 ENCOUNTER — Encounter: Payer: Self-pay | Admitting: Family Medicine

## 2024-07-03 VITALS — BP 113/55 | HR 59 | Wt 143.6 lb

## 2024-07-03 DIAGNOSIS — Z720 Tobacco use: Secondary | ICD-10-CM

## 2024-07-03 DIAGNOSIS — Z23 Encounter for immunization: Secondary | ICD-10-CM

## 2024-07-03 DIAGNOSIS — L301 Dyshidrosis [pompholyx]: Secondary | ICD-10-CM

## 2024-07-03 DIAGNOSIS — I10 Essential (primary) hypertension: Secondary | ICD-10-CM

## 2024-07-03 NOTE — Patient Instructions (Addendum)
 It was wonderful to see you today.  Please bring ALL of your medications with you to every visit.    VISIT SUMMARY: Today, you came in for a follow-up on your blood pressure management. We discussed your recent experiences with blood pressure, smoking cessation, and skin condition. We also reviewed your general health maintenance needs.  YOUR PLAN: -ESSENTIAL HYPERTENSION: Essential hypertension means high blood pressure without a known secondary cause. Your blood pressure has been slightly low recently, but you have not experienced any dizziness or lightheadedness. We will continue your current medications and rechecked your heart rate and blood pressure today.  -DYSHIDROTIC ECZEMA: Dyshidrotic eczema is a skin condition that causes small blisters and itching, usually on the hands. You should continue using the topical cream as it helps manage your symptoms.  -NICOTINE  DEPENDENCE: Nicotine  dependence refers to addiction to nicotine , commonly from smoking. You have not smoked for two weeks and have not experienced withdrawal symptoms. Continuing to abstain from smoking will benefit your blood pressure and overall cardiovascular health. You can use nicotine  gum or lozenges if needed.  -GENERAL HEALTH MAINTENANCE: You are due for a flu vaccine and a COVID booster. We also need to monitor your kidney function with blood work, which has been ordered.  INSTRUCTIONS: Please follow up with the blood work for kidney function monitoring as ordered. Additionally, make sure to get your  COVID booster as soon as possible.  Contains text generated by [employer].   Thank you for choosing [facility].   Please call [phone] with any questions about today's appointment.  Please be sure to schedule follow up at the front desk before you leave today.   Areta Saliva, MD  Family Medicine

## 2024-07-03 NOTE — Assessment & Plan Note (Signed)
 Not smoked for two weeks, no withdrawal symptoms. Cessation beneficial for blood pressure and cardiovascular health. - Encouraged continued smoking cessation. - Offered nicotine  gum or lozenges if needed.

## 2024-07-03 NOTE — Assessment & Plan Note (Signed)
 Sometimes worsens depending on climate. Controlled at this time and not bothersome to patient.  Continue current management.

## 2024-07-03 NOTE — Progress Notes (Signed)
° °  SUBJECTIVE:   CHIEF COMPLAINT / HPI:  Discussed the use of AI scribe software for clinical note transcription with the patient, who gave verbal consent to proceed.  History of Present Illness [name] Page is a 78 year old female with hypertension and coronary artery disease who presents for a follow-up on her blood pressure management.  Blood pressure and cardiovascular symptoms - No lightheadedness or headaches today - Current antihypertensive regimen includes amlodipine , bisoprolol , hydrochlorothiazide , and losartan  - Also takes aspirin  and cilostazol   Nicotine  use and smoking cessation - Previously smoked, abstinent for approximately two weeks since it has been cold outside  - No withdrawal symptoms - Uses nicotine  gum and lozenges as needed  Dyshidrotic eczema - Recurrent blistering and pruritus of the hands - Topical cream improves symptoms - Blisters dry and clear without further treatment - Worse when hands are wet     PERTINENT  PMH / PSH: HTN, CAD, COPD  OBJECTIVE:  BP (!) 113/55   Pulse (!) 59   Wt 143 lb 9.6 oz (65.1 kg)   SpO2 100%   BMI 26.26 kg/m   General: well appearing, in no acute distress CV: RRR, radial pulses equal and palpable, no BLE edema  Resp: Normal work of breathing on room air, CTAB Neuro: Alert & Oriented  Derm: small vesicular blisters on bilateral hands, some hyperpigmented. Mildly pruritic   ASSESSMENT/PLAN:   Assessment & Plan Essential hypertension, benign Blood pressure slightly low, no dizziness or lightheadedness.  - Continue current antihypertensive medications. - BMP today  Encounter for immunization Flu vaccine today  - Recommended covid and shingles vaccine at the pharmacy  Tobacco use Not smoked for two weeks, no withdrawal symptoms. Cessation beneficial for blood pressure and cardiovascular health. - Encouraged continued smoking cessation. - Offered nicotine  gum or lozenges if needed. Dyshidrotic  eczema Sometimes worsens depending on climate. Controlled at this time and not bothersome to patient.  Continue current management.   Follow up in 3-6 months    Areta Saliva, MD [facility]

## 2024-07-04 LAB — BASIC METABOLIC PANEL WITH GFR
BUN/Creatinine Ratio: 13 (ref 12–28)
BUN: 8 mg/dL (ref 8–27)
CO2: 26 mmol/L (ref 20–29)
Calcium: 9.7 mg/dL (ref 8.7–10.3)
Chloride: 101 mmol/L (ref 96–106)
Creatinine, Ser: 0.63 mg/dL (ref 0.57–1.00)
Glucose: 83 mg/dL (ref 70–99)
Potassium: 3.7 mmol/L (ref 3.5–5.2)
Sodium: 139 mmol/L (ref 134–144)
eGFR: 91 mL/min/1.73 (ref >59)

## 2024-07-06 ENCOUNTER — Ambulatory Visit: Payer: Self-pay | Admitting: Family Medicine

## 2024-07-21 ENCOUNTER — Other Ambulatory Visit: Payer: Self-pay | Admitting: Family Medicine

## 2024-07-21 DIAGNOSIS — K219 Gastro-esophageal reflux disease without esophagitis: Secondary | ICD-10-CM

## 2024-11-25 ENCOUNTER — Ambulatory Visit: Admitting: Cardiology

## 2025-01-11 ENCOUNTER — Encounter
